# Patient Record
Sex: Male | Born: 1935
Health system: Southern US, Community
[De-identification: ages and names within clinical notes are randomized; demographics above are authoritative.]

## PROBLEM LIST (undated history)

## (undated) DIAGNOSIS — Z87442 Personal history of urinary calculi: Secondary | ICD-10-CM

## (undated) DIAGNOSIS — K259 Gastric ulcer, unspecified as acute or chronic, without hemorrhage or perforation: Secondary | ICD-10-CM

## (undated) DIAGNOSIS — I2510 Atherosclerotic heart disease of native coronary artery without angina pectoris: Secondary | ICD-10-CM

## (undated) DIAGNOSIS — N200 Calculus of kidney: Secondary | ICD-10-CM

## (undated) DIAGNOSIS — C61 Malignant neoplasm of prostate: Secondary | ICD-10-CM

## (undated) DIAGNOSIS — E7800 Pure hypercholesterolemia, unspecified: Secondary | ICD-10-CM

## (undated) DIAGNOSIS — K219 Gastro-esophageal reflux disease without esophagitis: Secondary | ICD-10-CM

## (undated) DIAGNOSIS — M810 Age-related osteoporosis without current pathological fracture: Secondary | ICD-10-CM

## (undated) DIAGNOSIS — I213 ST elevation (STEMI) myocardial infarction of unspecified site: Secondary | ICD-10-CM

## (undated) DIAGNOSIS — I10 Essential (primary) hypertension: Secondary | ICD-10-CM

## (undated) DIAGNOSIS — M1990 Unspecified osteoarthritis, unspecified site: Secondary | ICD-10-CM

## (undated) HISTORY — PX: OTHER SURGICAL HISTORY: SHX169

## (undated) HISTORY — DX: Essential (primary) hypertension: I10

## (undated) HISTORY — DX: Age-related osteoporosis without current pathological fracture: M81.0

## (undated) HISTORY — DX: Unspecified osteoarthritis, unspecified site: M19.90

## (undated) HISTORY — PX: PROSTATE BIOPSY: SHX241

## (undated) HISTORY — DX: Calculus of kidney: N20.0

## (undated) HISTORY — DX: Malignant neoplasm of prostate: C61

## (undated) HISTORY — DX: Gastric ulcer, unspecified as acute or chronic, without hemorrhage or perforation: K25.9

## (undated) HISTORY — DX: Gastro-esophageal reflux disease without esophagitis: K21.9

## (undated) HISTORY — DX: Pure hypercholesterolemia, unspecified: E78.00

---

## 1998-06-30 ENCOUNTER — Other Ambulatory Visit: Admission: RE | Admit: 1998-06-30 | Discharge: 1998-06-30 | Payer: Self-pay | Admitting: Gastroenterology

## 2002-02-14 ENCOUNTER — Encounter: Payer: Self-pay | Admitting: Emergency Medicine

## 2002-02-14 ENCOUNTER — Encounter: Admission: RE | Admit: 2002-02-14 | Discharge: 2002-02-14 | Payer: Self-pay | Admitting: Emergency Medicine

## 2002-08-05 ENCOUNTER — Encounter: Payer: Self-pay | Admitting: Emergency Medicine

## 2002-08-05 ENCOUNTER — Encounter: Admission: RE | Admit: 2002-08-05 | Discharge: 2002-08-05 | Payer: Self-pay | Admitting: Emergency Medicine

## 2002-12-09 ENCOUNTER — Encounter: Admission: RE | Admit: 2002-12-09 | Discharge: 2002-12-09 | Payer: Self-pay | Admitting: Emergency Medicine

## 2002-12-09 ENCOUNTER — Encounter: Payer: Self-pay | Admitting: Emergency Medicine

## 2004-11-26 ENCOUNTER — Encounter: Admission: RE | Admit: 2004-11-26 | Discharge: 2004-11-26 | Payer: Self-pay | Admitting: Emergency Medicine

## 2004-12-07 ENCOUNTER — Ambulatory Visit: Payer: Self-pay | Admitting: Gastroenterology

## 2004-12-24 ENCOUNTER — Ambulatory Visit: Payer: Self-pay | Admitting: Gastroenterology

## 2005-01-11 ENCOUNTER — Ambulatory Visit: Payer: Self-pay | Admitting: Gastroenterology

## 2005-10-17 ENCOUNTER — Encounter: Admission: RE | Admit: 2005-10-17 | Discharge: 2005-10-17 | Payer: Self-pay | Admitting: Emergency Medicine

## 2006-07-11 ENCOUNTER — Encounter: Admission: RE | Admit: 2006-07-11 | Discharge: 2006-07-11 | Payer: Self-pay | Admitting: Emergency Medicine

## 2008-08-26 ENCOUNTER — Emergency Department (HOSPITAL_COMMUNITY): Admission: EM | Admit: 2008-08-26 | Discharge: 2008-08-26 | Payer: Self-pay | Admitting: Emergency Medicine

## 2008-09-30 ENCOUNTER — Ambulatory Visit: Payer: Self-pay | Admitting: Internal Medicine

## 2008-09-30 DIAGNOSIS — M810 Age-related osteoporosis without current pathological fracture: Secondary | ICD-10-CM | POA: Insufficient documentation

## 2008-09-30 DIAGNOSIS — M1990 Unspecified osteoarthritis, unspecified site: Secondary | ICD-10-CM | POA: Insufficient documentation

## 2008-09-30 DIAGNOSIS — I10 Essential (primary) hypertension: Secondary | ICD-10-CM | POA: Insufficient documentation

## 2008-09-30 DIAGNOSIS — Z860100 Personal history of colon polyps, unspecified: Secondary | ICD-10-CM | POA: Insufficient documentation

## 2008-09-30 DIAGNOSIS — J309 Allergic rhinitis, unspecified: Secondary | ICD-10-CM | POA: Insufficient documentation

## 2008-09-30 DIAGNOSIS — K219 Gastro-esophageal reflux disease without esophagitis: Secondary | ICD-10-CM

## 2008-09-30 DIAGNOSIS — H903 Sensorineural hearing loss, bilateral: Secondary | ICD-10-CM

## 2008-09-30 DIAGNOSIS — Z87442 Personal history of urinary calculi: Secondary | ICD-10-CM | POA: Insufficient documentation

## 2009-03-30 ENCOUNTER — Emergency Department (HOSPITAL_COMMUNITY): Admission: EM | Admit: 2009-03-30 | Discharge: 2009-03-30 | Payer: Self-pay | Admitting: Emergency Medicine

## 2009-12-03 ENCOUNTER — Encounter (INDEPENDENT_AMBULATORY_CARE_PROVIDER_SITE_OTHER): Payer: Self-pay | Admitting: *Deleted

## 2010-04-26 ENCOUNTER — Ambulatory Visit: Payer: Self-pay | Admitting: Urology

## 2010-09-29 ENCOUNTER — Telehealth: Payer: Self-pay | Admitting: Gastroenterology

## 2010-10-26 NOTE — Letter (Signed)
Summary: Colonoscopy Letter  [facility]  [place], [place] [address]   Phone: [phone]  Fax: [phone]      December 03, 2009 MRN: [id number]   [facility] [address] [place], [place]  [address]   Dear Tommy Dyer,   According to your medical record, it is time for you to schedule a Colonoscopy. The American Cancer Society recommends this procedure as a method to detect early colon cancer. Patients with a family history of colon cancer, or a personal history of colon polyps or inflammatory bowel disease are at increased risk.  This letter has beeen generated based on the recommendations made at the time of your procedure. If you feel that in your particular situation this may no longer apply, please contact our office.  Please call our office at [phone] to schedule this appointment or to update your records at your earliest convenience.  Thank you for cooperating with [place] to provide you with the very best care possible.   Sincerely,  Tommy Dyer Plan, M.D.  [facility] [phone]

## 2010-10-28 NOTE — Progress Notes (Signed)
Summary: Schedule Colonoscopy  Phone Note Outgoing Call Call back at Home Phone [phone]   Call placed by: Bernita Buffy CMA Deborra Medina),  September 29, 2010 8:27 AM Call placed to: Patient Summary of Call: patient due for colonoscopy and he will call back to schedule with any MD since Dr. Velora Heckler was his last MD. I gave the pt the number and he will check his schedule and call back . Initial call taken by: Bernita Buffy CMA ([place]),  September 29, 2010 8:28 AM

## 2011-01-02 LAB — POCT I-STAT, CHEM 8
BUN: 23 mg/dL (ref 6–23)
Creatinine, Ser: 1.4 mg/dL (ref 0.4–1.5)
Glucose, Bld: 90 mg/dL (ref 70–99)
HCT: 44 % (ref 39.0–52.0)
Potassium: 3.8 mEq/L (ref 3.5–5.1)

## 2011-04-13 NOTE — Telephone Encounter (Signed)
Error

## 2011-06-27 ENCOUNTER — Emergency Department (HOSPITAL_COMMUNITY): Payer: Medicare PPO

## 2011-06-27 ENCOUNTER — Emergency Department (HOSPITAL_COMMUNITY)
Admission: EM | Admit: 2011-06-27 | Discharge: 2011-06-27 | Disposition: A | Discharge status: Home / Self Care | Payer: Medicare PPO | Attending: Emergency Medicine | Admitting: Emergency Medicine

## 2011-06-27 DIAGNOSIS — R0602 Shortness of breath: Secondary | ICD-10-CM | POA: Insufficient documentation

## 2011-06-27 DIAGNOSIS — R05 Cough: Secondary | ICD-10-CM | POA: Insufficient documentation

## 2011-06-27 DIAGNOSIS — Z79899 Other long term (current) drug therapy: Secondary | ICD-10-CM | POA: Insufficient documentation

## 2011-06-27 DIAGNOSIS — R109 Unspecified abdominal pain: Secondary | ICD-10-CM | POA: Insufficient documentation

## 2011-06-27 DIAGNOSIS — R059 Cough, unspecified: Secondary | ICD-10-CM | POA: Insufficient documentation

## 2011-06-27 DIAGNOSIS — K219 Gastro-esophageal reflux disease without esophagitis: Secondary | ICD-10-CM | POA: Insufficient documentation

## 2011-06-27 DIAGNOSIS — E7800 Pure hypercholesterolemia, unspecified: Secondary | ICD-10-CM | POA: Insufficient documentation

## 2011-06-27 DIAGNOSIS — M810 Age-related osteoporosis without current pathological fracture: Secondary | ICD-10-CM | POA: Insufficient documentation

## 2011-06-27 DIAGNOSIS — R079 Chest pain, unspecified: Secondary | ICD-10-CM | POA: Insufficient documentation

## 2011-06-27 DIAGNOSIS — I10 Essential (primary) hypertension: Secondary | ICD-10-CM | POA: Insufficient documentation

## 2011-06-27 LAB — POCT I-STAT TROPONIN I: Troponin i, poc: 0 ng/mL (ref 0.00–0.08)

## 2011-06-27 LAB — COMPREHENSIVE METABOLIC PANEL
ALT: 26 U/L (ref 0–53)
Alkaline Phosphatase: 50 U/L (ref 39–117)
Calcium: 9.6 mg/dL (ref 8.4–10.5)
GFR calc non Af Amer: 50 mL/min — ABNORMAL LOW (ref >90)
Potassium: 3.8 mEq/L (ref 3.5–5.1)
Sodium: 136 mEq/L (ref 135–145)
Total Bilirubin: 0.3 mg/dL (ref 0.3–1.2)
Total Protein: 8 g/dL (ref 6.0–8.3)

## 2011-06-27 LAB — CBC
Hemoglobin: 14.6 g/dL (ref 13.0–17.0)
MCH: 30.4 pg (ref 26.0–34.0)
MCV: 82.7 fL (ref 78.0–100.0)
Platelets: 204 10*3/uL (ref 150–400)

## 2011-06-27 LAB — DIFFERENTIAL
Lymphocytes Relative: 30 % (ref 12–46)
Lymphs Abs: 1.7 10*3/uL (ref 0.7–4.0)
Monocytes Relative: 11 % (ref 3–12)
Neutro Abs: 3.2 10*3/uL (ref 1.7–7.7)
Neutrophils Relative %: 56 % (ref 43–77)

## 2011-06-27 LAB — LIPASE, BLOOD: Lipase: 49 U/L (ref 11–59)

## 2013-04-23 ENCOUNTER — Other Ambulatory Visit: Payer: Self-pay | Admitting: Internal Medicine

## 2013-04-23 DIAGNOSIS — R7989 Other specified abnormal findings of blood chemistry: Secondary | ICD-10-CM

## 2013-04-24 ENCOUNTER — Other Ambulatory Visit: Payer: Medicare PPO

## 2013-04-25 ENCOUNTER — Ambulatory Visit
Admission: RE | Admit: 2013-04-25 | Discharge: 2013-04-25 | Disposition: A | Discharge status: Home / Self Care | Payer: Medicare PPO | Source: Ambulatory Visit | Attending: Internal Medicine | Admitting: Internal Medicine

## 2013-04-25 DIAGNOSIS — R7989 Other specified abnormal findings of blood chemistry: Secondary | ICD-10-CM

## 2013-10-15 ENCOUNTER — Encounter: Payer: Self-pay | Admitting: Radiation Oncology

## 2013-10-15 DIAGNOSIS — C61 Malignant neoplasm of prostate: Secondary | ICD-10-CM | POA: Insufficient documentation

## 2013-10-15 NOTE — Progress Notes (Signed)
GU Location of Tumor / Histology: adenocarcinoma of the prostate   If Prostate Cancer, Gleason Score is (3 + 3=6) and PSA is (5.04); prostate volume 48.65 cc.  Patient evaluated August 2013 for left testicular discomfort x several months.  Repeat biopsy of prostate revealed:     Past/Anticipated interventions by urology, if any: active surveillance following initial biopsy done September 2013.  Past/Anticipated interventions by medical oncology, if any: None  Weight changes, if any:   Bowel/Bladder complaints, if any:    Nausea/Vomiting, if any:  Pain issues, if any:    SAFETY ISSUES:  Prior radiation? NO  Pacemaker/ICD? NO  Possible current pregnancy? N/A  Is the patient on methotrexate? N/A  Current Complaints / other details:  78 year old male. Married. Encouraged to consider definitive therapy instead of active surveillance because progression has been noted since September of 2013.

## 2013-10-16 ENCOUNTER — Telehealth: Payer: Self-pay | Admitting: *Deleted

## 2013-10-16 ENCOUNTER — Ambulatory Visit
Admission: RE | Admit: 2013-10-16 | Discharge: 2013-10-16 | Disposition: A | Discharge status: Home / Self Care | Payer: Medicare PPO | Source: Ambulatory Visit | Attending: Radiation Oncology | Admitting: Radiation Oncology

## 2013-10-16 ENCOUNTER — Encounter: Payer: Self-pay | Admitting: Radiation Oncology

## 2013-10-16 VITALS — HR 99 | Temp 98.0°F | Resp 16 | Ht 71.0 in | Wt 210.8 lb

## 2013-10-16 DIAGNOSIS — I10 Essential (primary) hypertension: Secondary | ICD-10-CM | POA: Insufficient documentation

## 2013-10-16 DIAGNOSIS — E7800 Pure hypercholesterolemia, unspecified: Secondary | ICD-10-CM | POA: Insufficient documentation

## 2013-10-16 DIAGNOSIS — C61 Malignant neoplasm of prostate: Secondary | ICD-10-CM

## 2013-10-16 DIAGNOSIS — Z87891 Personal history of nicotine dependence: Secondary | ICD-10-CM | POA: Insufficient documentation

## 2013-10-16 DIAGNOSIS — Z79899 Other long term (current) drug therapy: Secondary | ICD-10-CM | POA: Insufficient documentation

## 2013-10-16 DIAGNOSIS — K219 Gastro-esophageal reflux disease without esophagitis: Secondary | ICD-10-CM | POA: Insufficient documentation

## 2013-10-16 NOTE — Progress Notes (Signed)
See progress note under physician encounter. 

## 2013-10-16 NOTE — Telephone Encounter (Signed)
Called patient to inform of gold seed placement on 11-07-13 - arrival time - 10:15 am Dr. Janice Norrie' Office  and his sim on 11-13-13 @ 10:00 am at Dr. Johny Shears Office, spoke with patient and he is aware of these appts.

## 2013-10-16 NOTE — Progress Notes (Signed)
Radiation Oncology         [phone] ________________________________  Initial outpatient Consultation  Name: Tommy Dyer MRN: [id number]  Date: 10/16/2013  DOB: 1935/11/11  [id number] Ronnald Ramp, MD  Lowella Bandy, MD   REFERRING PHYSICIAN: Lowella Bandy, MD  DIAGNOSIS: 78 y.o. gentleman with stage T1c adenocarcinoma of the prostate with a Gleason's score of 3+3 and a PSA of 5.04  HISTORY OF PRESENT ILLNESS::Tommy Dyer is a 78 y.o. gentleman.  He was noted to have an elevated PSA of 5.04 by his primary care physician, Dr. Delfina Redwood.  Accordingly, he was referred for evaluation in urology by Dr. Janice Norrie in August, 2013,  digital rectal examination was performed at that time revealing a 3+ gland with no nodules.  The patient proceeded to transrectal ultrasound with 12 biopsies of the prostate on 06/21/12.  Out of 12 core biopsies, 4 were positive.  The maximum Gleason score was 3+3, and this was seen in right sided specimens.  The patient reviewed the biopsy results with his urologist and elected to pursue active surveillance.  In follow-up, the patient proceeded to transrectal ultrasound with 12 biopsies of the prostate on 09/05/13.  Out of 12 core biopsies, 8 were positive.  The maximum Gleason score was 3+3, and this was seen in bilateral specimens.  The patient reviewed the biopsy results with his urologist and he has kindly been referred today for discussion of potential radiation treatment options.  PREVIOUS RADIATION THERAPY: No  PAST MEDICAL HISTORY:  has a past medical history of Prostate cancer; Arthritis; Gastric ulcer; GERD (gastroesophageal reflux disease); Hypercholesterolemia; Hypertension; and Kidney stones.    PAST SURGICAL HISTORY: Past Surgical History  Procedure Laterality Date  . Prostate biopsy    . Prostate biopsy    . Colonscopy      FAMILY HISTORY: family history includes Cancer in his maternal grandmother and mother.  SOCIAL HISTORY:  reports that he quit smoking  about 10 years ago. He has never used smokeless tobacco. He reports that he does not drink alcohol or use illicit drugs.  ALLERGIES: Review of patient's allergies indicates no known allergies.  MEDICATIONS:  Current Outpatient Prescriptions  Medication Sig Dispense Refill  . acetaminophen (TYLENOL) 325 MG tablet Take 650 mg by mouth every 6 (six) hours as needed.      Marland Kitchen amLODipine (NORVASC) 10 MG tablet Take 10 mg by mouth daily.      Marland Kitchen aspirin 81 MG tablet Take 81 mg by mouth daily.      . Calcium Carbonate-Vitamin D (CALCIUM + D PO) Take by mouth.      . fenofibrate 54 MG tablet Take 54 mg by mouth daily.      . meclizine (ANTIVERT) 25 MG tablet Take 25 mg by mouth 3 (three) times daily as needed for dizziness.      . mometasone (NASONEX) 50 MCG/ACT nasal spray Place 2 sprays into the nose daily.      . psyllium (METAMUCIL) 58.6 % powder Take 1 packet by mouth 3 (three) times daily.      Marland Kitchen HYDROcodone-acetaminophen (NORCO/VICODIN) 5-325 MG per tablet Take 1 tablet by mouth every 6 (six) hours as needed for moderate pain.      . ranitidine (ZANTAC) 150 MG tablet Take 150 mg by mouth 2 (two) times daily.      Marland Kitchen testosterone (ANDROGEL) 50 MG/5GM GEL Place 5 g onto the skin daily.       No current facility-administered medications for this encounter.  REVIEW OF SYSTEMS:  A 15 point review of systems is documented in the electronic medical record. This was obtained by the nursing staff. However, I reviewed this with the patient to discuss relevant findings and make appropriate changes.  A comprehensive review of systems was negative..  The patient completed an IPSS and IIEF questionnaire.     PHYSICAL EXAM: This patient is in no acute distress.  He is alert and oriented.   height is 5\' 11"  (1.803 m) and weight is 210 lb 12.8 oz (95.618 kg). His oral temperature is 98 F (36.7 C). His pulse is 99. His respiration is 16 and oxygen saturation is 96%.  He exhibits no respiratory distress or  labored breathing.  He appears neurologically intact.  His mood is pleasant.  His affect is appropriate.  Please note the digital rectal exam findings described above.  KPS = 100   LABORATORY DATA:  Lab Results  Component Value Date   WBC 5.7 06/27/2011   HGB 14.6 06/27/2011   HCT 39.7 06/27/2011   MCV 82.7 06/27/2011   PLT 204 06/27/2011   Lab Results  Component Value Date   NA 136 06/27/2011   K 3.8 06/27/2011   CL 103 06/27/2011   CO2 24 06/27/2011   Lab Results  Component Value Date   ALT 26 06/27/2011   AST 33 06/27/2011   ALKPHOS 50 06/27/2011   BILITOT 0.3 06/27/2011     RADIOGRAPHY: No results found.    IMPRESSION: This gentleman is a 78 y.o. gentleman with stage T1c adenocarcinoma of the prostate with a Gleason's score of 3+3 and a PSA of 5.04.  His T-Stage, Gleason's Score, and PSA put him into the favorable risk group.  Accordingly he is eligible for a variety of potential treatment options including continued surveillance, prostatectomy, IMRT or seed implant.  PLAN:Today I reviewed the findings and workup thus far.  We discussed the natural history of prostate cancer.  We reviewed the the implications of T-stage, Gleason's Score, and PSA on decision-making and outcomes in prostate cancer.  We discussed radiation treatment in the management of prostate cancer with regard to the logistics and delivery of external beam radiation treatment as well as the logistics and delivery of prostate brachytherapy.  We compared and contrasted each of these approaches and also compared these against prostatectomy.  The patient expressed interest in external beam radiotherapy.  I filled out a patient counseling form for him with relevant treatment diagrams and we retained a copy for our records.   The patient would like to proceed with prostate IMRT.  I will share my findings with Dr. Janice Norrie and move forward with scheduling placement of three gold fiducial markers into the prostate to proceed with IMRT  in the near future.     I enjoyed meeting with him today, and will look forward to participating in the care of this very nice gentleman.   I spent 60 minutes face to face with the patient and more than 50% of that time was spent in counseling and/or coordination of care.   ------------------------------------------------  Sheral Apley. Tammi Klippel, M.D.

## 2013-10-16 NOTE — Progress Notes (Signed)
IPSS 14. Reports difficulty empty his bladder. Reports testicular pain with erections since teenage years. Denies testicular pain with bowel movements. Denies constipation, diarrhea, or blood in stool. Denies hematuria or dysuria. Describes strong steady stream at start of void that tapers out then, followed by a dribble. Denies incontinence or urgency. Denies nausea, vomiting, or weight loss. Reports occasional night sweats (twice this weak). Reports occasional low back pain resolved with tylenol. Denies bone pain but, does reports occasional bursitis.

## 2013-10-25 ENCOUNTER — Encounter: Payer: Self-pay | Admitting: *Deleted

## 2013-10-25 NOTE — Progress Notes (Signed)
[place] Psychosocial Distress Screening Clinical Social Work  Clinical Social Work was referred by distress screening protocol.  The patient scored a 8 on the Psychosocial Distress Thermometer which indicates severe distress. Clinical Social Worker phoned to assess for distress and other psychosocial needs. Pt reports anxiety surrounding his upcoming treatment. CSW processed these emotions and discussed coping techniques to assist. Pt is very interested in the Prostate Support Group and CSW provided him with this info as well as CSW info for other follow up.     Clinical Social Worker follow up needed: no  Loren Racer, [place] Social Worker [facility] for [facility] Wednesday, Thursday and Friday Phone: [phone] Fax: [phone]

## 2013-11-07 ENCOUNTER — Encounter: Payer: Self-pay | Admitting: Radiation Oncology

## 2013-11-07 NOTE — Progress Notes (Signed)
Patient phoned inquiring about cost of radiation treatments.  Patient has [employer] - looked up on website, currently patient has an individual OOP [phone] (already met $45).  Explained to him that we do not bill until he is finished with treatment and will not have to pay a co-pay each time he comes in.  Told him about the patient acctg office that will set up a payment plan when the time comes.

## 2013-11-12 ENCOUNTER — Encounter: Payer: Self-pay | Admitting: Radiation Oncology

## 2013-11-12 NOTE — Progress Notes (Addendum)
  Radiation Oncology         [phone] ________________________________  Name: Tommy Dyer MRN: [id number]  Date: 11/13/2013  DOB: [birth year]  SIMULATION AND TREATMENT PLANNING NOTE  DIAGNOSIS:  78 y.o. gentleman with stage T1c adenocarcinoma of the prostate with a Gleason's score of 3+3 and a PSA of 5.04  NARRATIVE:  The patient was brought to the [facility].  Identity was confirmed.  All relevant records and images related to the planned course of therapy were reviewed.  The patient freely provided informed written consent to proceed with treatment after reviewing the details related to the planned course of therapy. The consent form was witnessed and verified by the simulation staff.  Then, the patient was set-up in a stable reproducible supine position for radiation therapy.  A vacuum lock pillow device was custom fabricated to position his legs in a reproducible immobilized position.  Then, I performed a urethrogram under sterile conditions to identify the prostatic apex.  CT images were obtained.  Surface markings were placed.  The CT images were loaded into the planning software.  Then the prostate target and avoidance structures including the rectum, bladder, bowel and hips were contoured.  Treatment planning then occurred.  The radiation prescription was entered and confirmed.  A total of one BodyFix complex treatment device was fabricated. I have requested : Intensity Modulated Radiotherapy (IMRT) is medically necessary for this case for the following reason:  Rectal sparing.Marland Kitchen  PLAN:  The patient will receive 76 Gy in 40 fractions.  ________________________________  Sheral Apley Tammi Klippel, M.D.

## 2013-11-13 ENCOUNTER — Encounter: Payer: Self-pay | Admitting: Radiation Oncology

## 2013-11-13 ENCOUNTER — Ambulatory Visit
Admission: RE | Admit: 2013-11-13 | Discharge: 2013-11-13 | Disposition: A | Discharge status: Home / Self Care | Payer: Medicare PPO | Source: Ambulatory Visit | Attending: Radiation Oncology | Admitting: Radiation Oncology

## 2013-11-13 DIAGNOSIS — R351 Nocturia: Secondary | ICD-10-CM | POA: Insufficient documentation

## 2013-11-13 DIAGNOSIS — R5383 Other fatigue: Secondary | ICD-10-CM

## 2013-11-13 DIAGNOSIS — C61 Malignant neoplasm of prostate: Secondary | ICD-10-CM | POA: Insufficient documentation

## 2013-11-13 DIAGNOSIS — R109 Unspecified abdominal pain: Secondary | ICD-10-CM | POA: Insufficient documentation

## 2013-11-13 DIAGNOSIS — Z510 Encounter for antineoplastic radiation therapy: Secondary | ICD-10-CM | POA: Insufficient documentation

## 2013-11-13 DIAGNOSIS — R5381 Other malaise: Secondary | ICD-10-CM | POA: Insufficient documentation

## 2013-11-13 DIAGNOSIS — R300 Dysuria: Secondary | ICD-10-CM | POA: Insufficient documentation

## 2013-11-14 ENCOUNTER — Encounter: Payer: Self-pay | Admitting: *Deleted

## 2013-11-20 ENCOUNTER — Ambulatory Visit: Payer: Medicare PPO | Admitting: Radiation Oncology

## 2013-11-21 ENCOUNTER — Ambulatory Visit: Payer: Medicare PPO

## 2013-11-22 ENCOUNTER — Ambulatory Visit: Payer: Medicare PPO

## 2013-11-25 ENCOUNTER — Telehealth: Payer: Self-pay | Admitting: Radiation Oncology

## 2013-11-25 ENCOUNTER — Ambulatory Visit
Admission: RE | Admit: 2013-11-25 | Discharge: 2013-11-25 | Disposition: A | Discharge status: Home / Self Care | Payer: Medicare PPO | Source: Ambulatory Visit | Attending: Radiation Oncology | Admitting: Radiation Oncology

## 2013-11-25 NOTE — Telephone Encounter (Signed)
Returned message left by patient. Patient questions if he can continue to walk a mile on the treadmill at the [facility]. Patient started radiation treatment to prostate today. Encouraged patient to continue at 30 minutes of exercise each day to limit future fatigue. Also, patient question if he could wear deodorant. Explained he could but, to avoid creams, powders, lotions and perfumes within treatment field/pelvic region four hours prior to treatment. Patient verbalized understanding of all reviewed. Encouraged patient to contact staff with future needs.

## 2013-11-26 ENCOUNTER — Ambulatory Visit
Admission: RE | Admit: 2013-11-26 | Discharge: 2013-11-26 | Disposition: A | Discharge status: Home / Self Care | Payer: Medicare PPO | Source: Ambulatory Visit | Attending: Radiation Oncology | Admitting: Radiation Oncology

## 2013-11-27 ENCOUNTER — Ambulatory Visit
Admission: RE | Admit: 2013-11-27 | Discharge: 2013-11-27 | Disposition: A | Discharge status: Home / Self Care | Payer: Medicare PPO | Source: Ambulatory Visit | Attending: Radiation Oncology | Admitting: Radiation Oncology

## 2013-11-28 ENCOUNTER — Ambulatory Visit
Admission: RE | Admit: 2013-11-28 | Discharge: 2013-11-28 | Disposition: A | Discharge status: Home / Self Care | Payer: Medicare PPO | Source: Ambulatory Visit | Attending: Radiation Oncology | Admitting: Radiation Oncology

## 2013-11-29 ENCOUNTER — Ambulatory Visit
Admission: RE | Admit: 2013-11-29 | Discharge: 2013-11-29 | Disposition: A | Discharge status: Home / Self Care | Payer: Medicare PPO | Source: Ambulatory Visit | Attending: Radiation Oncology | Admitting: Radiation Oncology

## 2013-11-29 ENCOUNTER — Encounter: Payer: Self-pay | Admitting: Radiation Oncology

## 2013-11-29 VITALS — BP 170/87 | HR 99 | Resp 16 | Wt 213.6 lb

## 2013-11-29 DIAGNOSIS — C61 Malignant neoplasm of prostate: Secondary | ICD-10-CM

## 2013-11-29 NOTE — Progress Notes (Signed)
Denies dysuria or hematuria. Reports a moderate urine stream. Reports nocturia x4. Denies diarrhea. Denies fatigue. Reports dry itchy flaky lower leg skin. Encourage patient to apply lotion to lower legs. No hyperpigmentation noted of lower legs. Patient denies pain in lower legs or numbness.

## 2013-11-30 ENCOUNTER — Encounter: Payer: Self-pay | Admitting: Radiation Oncology

## 2013-11-30 NOTE — Progress Notes (Signed)
  Radiation Oncology         [phone] ________________________________  Name: Tommy Dyer MRN: [id number]  Date: 11/29/2013  DOB: 14-Jul-1936  Weekly Radiation Therapy Management  Current Dose: 9.5 Gy     Planned Dose:  76 Gy  Narrative . . . . . . . . The patient presents for routine under treatment assessment.                                   The patient is without complaint.                                 Set-up films were reviewed.                                 The chart was checked. Physical Findings. . .  weight is 213 lb 9.6 oz (96.888 kg). His blood pressure is 170/87 and his pulse is 99. His respiration is 16. . Weight essentially stable.  No significant changes. Impression . . . . . . . The patient is tolerating radiation. Plan . . . . . . . . . . . . Continue treatment as planned.  ________________________________  Sheral Apley. Tammi Klippel, M.D.

## 2013-12-02 ENCOUNTER — Ambulatory Visit
Admission: RE | Admit: 2013-12-02 | Discharge: 2013-12-02 | Disposition: A | Discharge status: Home / Self Care | Payer: Medicare PPO | Source: Ambulatory Visit | Attending: Radiation Oncology | Admitting: Radiation Oncology

## 2013-12-02 NOTE — Progress Notes (Signed)
11/29/2013 at 1730. Completed post sim education with patient. Provided patient with RADIATION THERAPY AND YOU handbook then, reviewed pertinent information. Reviewed potential side effects and management such as, urinary/bladder changes and fatigue. Encouraged patient to continue to go to the [facility]. Provided patient the opportunity to ask questions. Provided patient with this writers contact information and encouraged to call with needs. Patient verbalized understanding of all reviewed.

## 2013-12-02 NOTE — Addendum Note (Signed)
Encounter addended by: Heywood Footman, RN on: 12/02/2013 12:17 PM<BR>     Documentation filed: Inpatient Patient Education, Inpatient Document Flowsheet, Notes Section, Chief Complaint Section

## 2013-12-03 ENCOUNTER — Ambulatory Visit
Admission: RE | Admit: 2013-12-03 | Discharge: 2013-12-03 | Disposition: A | Discharge status: Home / Self Care | Payer: Medicare PPO | Source: Ambulatory Visit | Attending: Radiation Oncology | Admitting: Radiation Oncology

## 2013-12-03 ENCOUNTER — Ambulatory Visit: Admission: RE | Admit: 2013-12-03 | Payer: Medicare PPO | Source: Ambulatory Visit

## 2013-12-04 ENCOUNTER — Ambulatory Visit
Admission: RE | Admit: 2013-12-04 | Discharge: 2013-12-04 | Disposition: A | Discharge status: Home / Self Care | Payer: Medicare PPO | Source: Ambulatory Visit | Attending: Radiation Oncology | Admitting: Radiation Oncology

## 2013-12-05 ENCOUNTER — Ambulatory Visit
Admission: RE | Admit: 2013-12-05 | Discharge: 2013-12-05 | Disposition: A | Discharge status: Home / Self Care | Payer: Medicare PPO | Source: Ambulatory Visit | Attending: Radiation Oncology | Admitting: Radiation Oncology

## 2013-12-06 ENCOUNTER — Encounter: Payer: Self-pay | Admitting: Radiation Oncology

## 2013-12-06 ENCOUNTER — Ambulatory Visit
Admission: RE | Admit: 2013-12-06 | Discharge: 2013-12-06 | Disposition: A | Discharge status: Home / Self Care | Payer: Medicare PPO | Source: Ambulatory Visit | Attending: Radiation Oncology | Admitting: Radiation Oncology

## 2013-12-06 VITALS — Resp 16 | Wt 211.5 lb

## 2013-12-06 DIAGNOSIS — R300 Dysuria: Secondary | ICD-10-CM | POA: Insufficient documentation

## 2013-12-06 DIAGNOSIS — C61 Malignant neoplasm of prostate: Secondary | ICD-10-CM | POA: Diagnosis present

## 2013-12-06 LAB — URINALYSIS, MICROSCOPIC - CHCC
Bilirubin (Urine): NEGATIVE
GLUCOSE UR CHCC: NEGATIVE mg/dL
Ketones: NEGATIVE mg/dL
NITRITE: NEGATIVE
Protein: 30 mg/dL
SPECIFIC GRAVITY, URINE: 1.01 (ref 1.003–1.035)
UROBILINOGEN UR: 0.2 mg/dL (ref 0.2–1)
pH: 7.5 (ref 4.6–8.0)

## 2013-12-06 MED ORDER — TAMSULOSIN HCL 0.4 MG PO CAPS: 0.4000 mg | ORAL_CAPSULE | Freq: Every day | ORAL | Status: DC

## 2013-12-06 NOTE — Progress Notes (Signed)
Denies dysuria or hematuria. Reports a moderate urine stream. Reports nocturia x 6 or more making it difficult to rest. Reports loose stool. Denies fatigue. Reports increase in frequency of semen in under shorts for no reason.

## 2013-12-06 NOTE — Addendum Note (Signed)
Encounter addended by: Lora Paula, MD on: 12/06/2013 12:33 PM<BR>     Documentation filed: Visit Diagnoses, Orders

## 2013-12-06 NOTE — Progress Notes (Signed)
  Radiation Oncology         [phone] ________________________________  Name: Tommy Dyer MRN: [id number]  Date: 12/06/2013  DOB: Aug 24, 1936  Weekly Radiation Therapy Management  Current Dose: 19 Gy     Planned Dose:  76 Gy  Narrative . . . . . . . . The patient presents for routine under treatment assessment.                                   Denies dysuria or hematuria. Reports a moderate urine stream. Reports nocturia x 6 or more making it difficult to rest. Reports loose stool. Denies fatigue. Reports increase in frequency of semen in under shorts for no reason                                 Set-up films were reviewed.                                 The chart was checked. Physical Findings. . .  weight is 211 lb 8 oz (95.936 kg). His respiration is 16. . Weight essentially stable.  No significant changes. Impression . . . . . . . The patient is tolerating radiation. Plan . . . . . . . . . . . . Continue treatment as planned.  Start Flomax.  ________________________________  Sheral Apley Tammi Klippel, M.D.

## 2013-12-07 LAB — URINE CULTURE

## 2013-12-07 NOTE — Progress Notes (Signed)
Quick Note:  Please call patient with normal result.  Thanks. MM ______ 

## 2013-12-09 ENCOUNTER — Ambulatory Visit
Admission: RE | Admit: 2013-12-09 | Discharge: 2013-12-09 | Disposition: A | Discharge status: Home / Self Care | Payer: Medicare PPO | Source: Ambulatory Visit | Attending: Radiation Oncology | Admitting: Radiation Oncology

## 2013-12-09 ENCOUNTER — Telehealth: Payer: Self-pay | Admitting: Radiation Oncology

## 2013-12-09 NOTE — Telephone Encounter (Signed)
As directed by Dr. Tammi Klippel phoned patient informing him of normal results of UA and C&S. Patient verbalized understanding and expressed appreciation for the call.

## 2013-12-10 ENCOUNTER — Ambulatory Visit
Admission: RE | Admit: 2013-12-10 | Discharge: 2013-12-10 | Disposition: A | Discharge status: Home / Self Care | Payer: Medicare PPO | Source: Ambulatory Visit | Attending: Radiation Oncology | Admitting: Radiation Oncology

## 2013-12-11 ENCOUNTER — Ambulatory Visit
Admission: RE | Admit: 2013-12-11 | Discharge: 2013-12-11 | Disposition: A | Discharge status: Home / Self Care | Payer: Medicare PPO | Source: Ambulatory Visit | Attending: Radiation Oncology | Admitting: Radiation Oncology

## 2013-12-12 ENCOUNTER — Ambulatory Visit
Admission: RE | Admit: 2013-12-12 | Discharge: 2013-12-12 | Disposition: A | Discharge status: Home / Self Care | Payer: Medicare PPO | Source: Ambulatory Visit | Attending: Radiation Oncology | Admitting: Radiation Oncology

## 2013-12-12 ENCOUNTER — Encounter: Payer: Self-pay | Admitting: Radiation Oncology

## 2013-12-12 VITALS — BP 144/82 | HR 95 | Temp 97.8°F | Resp 16 | Wt 212.8 lb

## 2013-12-12 DIAGNOSIS — C61 Malignant neoplasm of prostate: Secondary | ICD-10-CM

## 2013-12-12 NOTE — Progress Notes (Signed)
  Radiation Oncology         [phone] ________________________________  Name: Tommy Dyer MRN: [id number]  Date: 12/12/2013  DOB: [birth year]  Weekly Radiation Therapy Management  Current Dose: 26.6 Gy     Planned Dose:  76 Gy  Narrative . . . . . . . . The patient presents for routine under treatment assessment.                                  Weekly rad txs 14/40 prostate completed, nocturia x2 last night, has more urgency sated, no hematuria, loose stools stated"But I like it that way", no c/o pain, no fatigue, appetite good, started flomax feels helping some, no dysuria  The patient is without complaint.                                 Set-up films were reviewed.                                 The chart was checked. Physical Findings. . .  weight is 212 lb 12.8 oz (96.525 kg). His oral temperature is 97.8 F (36.6 C). His blood pressure is 144/82 and his pulse is 95. His respiration is 16. . Weight essentially stable.  No significant changes. Impression . . . . . . . The patient is tolerating radiation. Plan . . . . . . . . . . . . Continue treatment as planned.  ________________________________  Sheral Apley. Tammi Klippel, M.D.

## 2013-12-12 NOTE — Progress Notes (Signed)
Weekly rad txs 14/40 prostate completed, nocturia x2 last night, has more urgency sated, no hematuria, loose stools stated"But I like it that way", no c/o pain, no fatigue, appetite good, started flomax feels helping some, no dysuria 11:53 AM

## 2013-12-13 ENCOUNTER — Ambulatory Visit: Payer: Medicare PPO | Admitting: Radiation Oncology

## 2013-12-13 ENCOUNTER — Ambulatory Visit
Admission: RE | Admit: 2013-12-13 | Discharge: 2013-12-13 | Disposition: A | Discharge status: Home / Self Care | Payer: Medicare PPO | Source: Ambulatory Visit | Attending: Radiation Oncology | Admitting: Radiation Oncology

## 2013-12-16 ENCOUNTER — Ambulatory Visit
Admission: RE | Admit: 2013-12-16 | Discharge: 2013-12-16 | Disposition: A | Discharge status: Home / Self Care | Payer: Medicare PPO | Source: Ambulatory Visit | Attending: Radiation Oncology | Admitting: Radiation Oncology

## 2013-12-17 ENCOUNTER — Ambulatory Visit
Admission: RE | Admit: 2013-12-17 | Discharge: 2013-12-17 | Disposition: A | Discharge status: Home / Self Care | Payer: Medicare PPO | Source: Ambulatory Visit | Attending: Radiation Oncology | Admitting: Radiation Oncology

## 2013-12-18 ENCOUNTER — Ambulatory Visit
Admission: RE | Admit: 2013-12-18 | Discharge: 2013-12-18 | Disposition: A | Discharge status: Home / Self Care | Payer: Medicare PPO | Source: Ambulatory Visit | Attending: Radiation Oncology | Admitting: Radiation Oncology

## 2013-12-19 ENCOUNTER — Ambulatory Visit
Admission: RE | Admit: 2013-12-19 | Discharge: 2013-12-19 | Disposition: A | Discharge status: Home / Self Care | Payer: Medicare PPO | Source: Ambulatory Visit | Attending: Radiation Oncology | Admitting: Radiation Oncology

## 2013-12-19 ENCOUNTER — Encounter: Payer: Self-pay | Admitting: Radiation Oncology

## 2013-12-19 NOTE — Progress Notes (Signed)
  Radiation Oncology         [phone] ________________________________  Name: Tommy Dyer MRN: [id number]  Date: 12/20/2013  DOB: [birth year]  Weekly Radiation Therapy Management  Current Dose: 38 Gy     Planned Dose:  76 Gy  Narrative . . . . . . . . The patient presents for routine under treatment assessment.                                   Reports flomax helps empty bladder however, if he forgets a dose he reports difficulty initiating urine flow. Denies dyuria or hematuria. Denies diarrhea but, reports recently he has had to strain to pass stool. Denies fatigue. Reports nocturia x3                                 Set-up films were reviewed.                                 The chart was checked. Physical Findings. . .  weight is 213 lb (96.616 kg). His blood pressure is 150/87 and his pulse is 100. His respiration is 16. . Weight essentially stable.  No significant changes. Impression . . . . . . . The patient is tolerating radiation. Plan . . . . . . . . . . . . Continue treatment as planned.  ________________________________  Sheral Apley. Tammi Klippel, M.D.

## 2013-12-20 ENCOUNTER — Encounter: Payer: Self-pay | Admitting: Radiation Oncology

## 2013-12-20 ENCOUNTER — Ambulatory Visit
Admission: RE | Admit: 2013-12-20 | Discharge: 2013-12-20 | Disposition: A | Discharge status: Home / Self Care | Payer: Medicare PPO | Source: Ambulatory Visit | Attending: Radiation Oncology | Admitting: Radiation Oncology

## 2013-12-20 VITALS — BP 150/87 | HR 100 | Resp 16 | Wt 213.0 lb

## 2013-12-20 DIAGNOSIS — C61 Malignant neoplasm of prostate: Secondary | ICD-10-CM

## 2013-12-20 NOTE — Progress Notes (Signed)
Reports flomax helps empty bladder however, if he forgets a dose he reports difficulty initiating urine flow. Denies dyuria or hematuria. Denies diarrhea but, reports recently he has had to strain to pass stool. Denies fatigue. Reports nocturia x3.

## 2013-12-23 ENCOUNTER — Ambulatory Visit
Admission: RE | Admit: 2013-12-23 | Discharge: 2013-12-23 | Disposition: A | Discharge status: Home / Self Care | Payer: Medicare PPO | Source: Ambulatory Visit | Attending: Radiation Oncology | Admitting: Radiation Oncology

## 2013-12-24 ENCOUNTER — Ambulatory Visit
Admission: RE | Admit: 2013-12-24 | Discharge: 2013-12-24 | Disposition: A | Discharge status: Home / Self Care | Payer: Medicare PPO | Source: Ambulatory Visit | Attending: Radiation Oncology | Admitting: Radiation Oncology

## 2013-12-25 ENCOUNTER — Ambulatory Visit
Admission: RE | Admit: 2013-12-25 | Discharge: 2013-12-25 | Disposition: A | Discharge status: Home / Self Care | Payer: Medicare PPO | Source: Ambulatory Visit | Attending: Radiation Oncology | Admitting: Radiation Oncology

## 2013-12-26 ENCOUNTER — Ambulatory Visit
Admission: RE | Admit: 2013-12-26 | Discharge: 2013-12-26 | Disposition: A | Discharge status: Home / Self Care | Payer: Medicare PPO | Source: Ambulatory Visit | Attending: Radiation Oncology | Admitting: Radiation Oncology

## 2013-12-27 ENCOUNTER — Ambulatory Visit
Admission: RE | Admit: 2013-12-27 | Discharge: 2013-12-27 | Disposition: A | Discharge status: Home / Self Care | Payer: Medicare PPO | Source: Ambulatory Visit | Attending: Radiation Oncology | Admitting: Radiation Oncology

## 2013-12-27 ENCOUNTER — Encounter: Payer: Self-pay | Admitting: Radiation Oncology

## 2013-12-27 VITALS — BP 141/78 | HR 98 | Temp 97.7°F | Resp 20 | Wt 214.0 lb

## 2013-12-27 DIAGNOSIS — C61 Malignant neoplasm of prostate: Secondary | ICD-10-CM

## 2013-12-27 NOTE — Progress Notes (Signed)
Pt reports sinus issues x 2 days w/nasal congestion, clear rhinorrhea, sinus pressure. He has history of sinus/allergy issues. He takes OTC for this. Pt denies fatigue, loss of appetite. He reports urinary frequency/nocturia that comes and goes, slow stream that improves w/Flomax, "stinging" at times, soft BM's x 2 days.

## 2013-12-27 NOTE — Progress Notes (Signed)
   Weekly Management Note:  outpatient Current Dose:  47.5 Gy  Projected Dose: 76 Gy   Narrative:  The patient presents for routine under treatment assessment.  CBCT/MVCT images/Port film x-rays were reviewed.  The chart was checked. Occasional stinging with urination. 1 episode of blood on toilet tissue after firm BM. Now BM is softer and this issue hasn't recurred.  Nocturia x 3. On Flomax.  Physical Findings:  weight is 214 lb (97.07 kg). His oral temperature is 97.7 F (36.5 C). His blood pressure is 141/78 and his pulse is 98. His respiration is 20.  NAD  Impression:  The patient is tolerating radiotherapy.  Plan:  Continue radiotherapy as planned.   ________________________________   Eppie Gibson, M.D.

## 2013-12-30 ENCOUNTER — Ambulatory Visit: Payer: Medicare PPO

## 2013-12-30 ENCOUNTER — Ambulatory Visit
Admission: RE | Admit: 2013-12-30 | Discharge: 2013-12-30 | Disposition: A | Discharge status: Home / Self Care | Payer: Medicare PPO | Source: Ambulatory Visit | Attending: Radiation Oncology | Admitting: Radiation Oncology

## 2013-12-31 ENCOUNTER — Ambulatory Visit
Admission: RE | Admit: 2013-12-31 | Discharge: 2013-12-31 | Disposition: A | Discharge status: Home / Self Care | Payer: Medicare PPO | Source: Ambulatory Visit | Attending: Radiation Oncology | Admitting: Radiation Oncology

## 2013-12-31 ENCOUNTER — Ambulatory Visit: Payer: Medicare PPO

## 2014-01-01 ENCOUNTER — Ambulatory Visit: Payer: Medicare PPO

## 2014-01-01 ENCOUNTER — Ambulatory Visit
Admission: RE | Admit: 2014-01-01 | Discharge: 2014-01-01 | Disposition: A | Discharge status: Home / Self Care | Payer: Medicare PPO | Source: Ambulatory Visit | Attending: Radiation Oncology | Admitting: Radiation Oncology

## 2014-01-02 ENCOUNTER — Ambulatory Visit
Admission: RE | Admit: 2014-01-02 | Discharge: 2014-01-02 | Disposition: A | Discharge status: Home / Self Care | Payer: Medicare PPO | Source: Ambulatory Visit | Attending: Radiation Oncology | Admitting: Radiation Oncology

## 2014-01-02 ENCOUNTER — Encounter: Payer: Self-pay | Admitting: Radiation Oncology

## 2014-01-02 ENCOUNTER — Ambulatory Visit: Payer: Medicare PPO

## 2014-01-02 VITALS — BP 137/75 | HR 101 | Resp 16 | Wt 213.0 lb

## 2014-01-02 DIAGNOSIS — C61 Malignant neoplasm of prostate: Secondary | ICD-10-CM

## 2014-01-02 NOTE — Progress Notes (Signed)
  Radiation Oncology         ([phone] ________________________________  Name: Tommy Dyer MRN: [id number]  Date: 01/02/2014  DOB: 1936-08-01  Weekly Radiation Therapy Management  Current Dose: 55.1 Gy     Planned Dose:  76 Gy  Narrative . . . . . . . . The patient presents for routine under treatment assessment.                                   Denies hematuria. Reports several episodes of diarrhea on Sunday for which mylanta resolved. Then, patient reports he didn't have a bowel movement for two day but, has since returned to daily routine formed bowel movements. Reports tingling in right foot. Reports he continues to take flomax as directed. Reports a "twinge" when he voids. Reports a "funny sensation" associated with bowel movements. Denies any of these sensations are unbearable. Reports nocturia x2. Denies fatigue and continues to work out regularly at [employer] were reviewed.                                 The chart was checked. Physical Findings. . .  weight is 213 lb (96.616 kg). His blood pressure is 137/75 and his pulse is 101. His respiration is 16. . Weight essentially stable.  No significant changes. Impression . . . . . . . The patient is tolerating radiation. Plan . . . . . . . . . . . . Continue treatment as planned.  ________________________________  Sheral Apley. Tammi Klippel, M.D.

## 2014-01-02 NOTE — Progress Notes (Signed)
Denies hematuria. Reports several episodes of diarrhea on Sunday for which mylanta resolved. Then, patient reports he didn't have a bowel movement for two day but, has since returned to daily routine formed bowel movements. Reports tingling in right foot. Reports he continues to take flomax as directed. Reports a "twinge" when he voids. Reports a "funny sensation" associated with bowel movements. Denies any of these sensations are unbearable. Reports nocturia x2. Denies fatigue and continues to work out regularly at [employer].

## 2014-01-03 ENCOUNTER — Ambulatory Visit: Payer: Medicare PPO

## 2014-01-03 ENCOUNTER — Ambulatory Visit
Admission: RE | Admit: 2014-01-03 | Discharge: 2014-01-03 | Disposition: A | Discharge status: Home / Self Care | Payer: Medicare PPO | Source: Ambulatory Visit | Attending: Radiation Oncology | Admitting: Radiation Oncology

## 2014-01-06 ENCOUNTER — Ambulatory Visit: Payer: Medicare PPO

## 2014-01-06 ENCOUNTER — Encounter: Payer: Self-pay | Admitting: Radiation Oncology

## 2014-01-06 ENCOUNTER — Ambulatory Visit
Admission: RE | Admit: 2014-01-06 | Discharge: 2014-01-06 | Disposition: A | Discharge status: Home / Self Care | Payer: Medicare PPO | Source: Ambulatory Visit | Attending: Radiation Oncology | Admitting: Radiation Oncology

## 2014-01-06 VITALS — BP 160/81 | HR 96 | Temp 97.6°F | Resp 20 | Wt 212.6 lb

## 2014-01-06 DIAGNOSIS — C61 Malignant neoplasm of prostate: Secondary | ICD-10-CM

## 2014-01-06 MED ORDER — HYDROCORTISONE ACETATE 25 MG RE SUPP: 25.0000 mg | Freq: Two times a day (BID) | RECTAL | Status: DC | PRN

## 2014-01-06 NOTE — Addendum Note (Signed)
Encounter addended by: Arlyss Repress, RN on: 01/06/2014  4:29 PM<BR>     Documentation filed: Notes Section

## 2014-01-06 NOTE — Progress Notes (Signed)
  Radiation Oncology         [phone] ________________________________  Name: SABIN MOLTON MRN: [id number]  Date: 01/06/2014  DOB: Aug 20, 1936  Weekly Radiation Therapy Management  Current Dose: 58.9 Gy     Planned Dose:  76 Gy  Narrative . . . . . . . . The patient presents c/o bleeding after bowel movements Friday, Saturday,Sunday and today, Felt after 1st bowel movement today, had urge for 2nd bm, stained, but only blood came out, having low abdominal cramping as well, nocturia x3, slight dysuria at times, , when taking flomax, voiding okay, but if he skips a day he has problems starting and stopping flow, appetite good, drinks mostly water and tea,                                 Set-up films were reviewed.                                 The chart was checked. Physical Findings. . .  weight is 212 lb 9.6 oz (96.435 kg). His oral temperature is 97.6 F (36.4 C). His blood pressure is 160/81 and his pulse is 96. His respiration is 20. . Weight essentially stable.  No significant changes. Impression . . . . . . . The patient is tolerating radiation. Plan . . . . . . . . . . . . Continue treatment as planned.  Given Proctosol.  ________________________________  Sheral Apley Tammi Klippel, M.D.

## 2014-01-06 NOTE — Progress Notes (Signed)
Weekly rad txs, 31/40 prostate completed, c/o bleeding after bowel movements Friday, Saturday,Sunday and today,  Felt after 1st bowel movement today, had urge for 2nd bm, stained, but only blood came out, having low abdominal cramping as well, nocturia x3, slight dysuria at times, , when taking flomax, voiding okay, but if he skips a day he has problems starting and stopping flow, appetite good, drinks mostly water and tea,,

## 2014-01-06 NOTE — Progress Notes (Signed)
Patient called back to inquire about prescription comparable to anusol suppositories as they cost $75 for 10.I notified [facility] pharmacist and he states proctosol hc cream is comparable at $26.22.

## 2014-01-07 ENCOUNTER — Ambulatory Visit: Payer: Medicare PPO

## 2014-01-07 ENCOUNTER — Telehealth: Payer: Self-pay | Admitting: Radiation Oncology

## 2014-01-07 ENCOUNTER — Ambulatory Visit: Admission: RE | Admit: 2014-01-07 | Payer: Medicare PPO | Source: Ambulatory Visit

## 2014-01-07 NOTE — Telephone Encounter (Signed)
Phoned patient's cell and home number to confirm he picked up the Anusol script. No answer. Left message requesting return call.

## 2014-01-08 ENCOUNTER — Ambulatory Visit
Admission: RE | Admit: 2014-01-08 | Discharge: 2014-01-08 | Disposition: A | Discharge status: Home / Self Care | Payer: Medicare PPO | Source: Ambulatory Visit | Attending: Radiation Oncology | Admitting: Radiation Oncology

## 2014-01-08 ENCOUNTER — Ambulatory Visit: Payer: Medicare PPO

## 2014-01-09 ENCOUNTER — Ambulatory Visit: Payer: Medicare PPO

## 2014-01-09 ENCOUNTER — Ambulatory Visit
Admission: RE | Admit: 2014-01-09 | Discharge: 2014-01-09 | Disposition: A | Discharge status: Home / Self Care | Payer: Medicare PPO | Source: Ambulatory Visit | Attending: Radiation Oncology | Admitting: Radiation Oncology

## 2014-01-10 ENCOUNTER — Ambulatory Visit
Admission: RE | Admit: 2014-01-10 | Discharge: 2014-01-10 | Disposition: A | Discharge status: Home / Self Care | Payer: Medicare PPO | Source: Ambulatory Visit | Attending: Radiation Oncology | Admitting: Radiation Oncology

## 2014-01-10 ENCOUNTER — Ambulatory Visit: Payer: Medicare PPO

## 2014-01-13 ENCOUNTER — Ambulatory Visit
Admission: RE | Admit: 2014-01-13 | Discharge: 2014-01-13 | Disposition: A | Discharge status: Home / Self Care | Payer: Medicare PPO | Source: Ambulatory Visit | Attending: Radiation Oncology | Admitting: Radiation Oncology

## 2014-01-13 ENCOUNTER — Ambulatory Visit: Payer: Medicare PPO

## 2014-01-14 ENCOUNTER — Ambulatory Visit
Admission: RE | Admit: 2014-01-14 | Discharge: 2014-01-14 | Disposition: A | Discharge status: Home / Self Care | Payer: Medicare PPO | Source: Ambulatory Visit | Attending: Radiation Oncology | Admitting: Radiation Oncology

## 2014-01-14 ENCOUNTER — Ambulatory Visit: Payer: Medicare PPO

## 2014-01-15 ENCOUNTER — Ambulatory Visit
Admission: RE | Admit: 2014-01-15 | Discharge: 2014-01-15 | Disposition: A | Discharge status: Home / Self Care | Payer: Medicare PPO | Source: Ambulatory Visit | Attending: Radiation Oncology | Admitting: Radiation Oncology

## 2014-01-15 ENCOUNTER — Ambulatory Visit: Payer: Medicare PPO

## 2014-01-16 ENCOUNTER — Ambulatory Visit: Payer: Medicare PPO

## 2014-01-16 ENCOUNTER — Ambulatory Visit
Admission: RE | Admit: 2014-01-16 | Discharge: 2014-01-16 | Disposition: A | Discharge status: Home / Self Care | Payer: Medicare PPO | Source: Ambulatory Visit | Attending: Radiation Oncology | Admitting: Radiation Oncology

## 2014-01-16 ENCOUNTER — Encounter: Payer: Self-pay | Admitting: Radiation Oncology

## 2014-01-16 VITALS — BP 154/88 | HR 104 | Resp 16 | Wt 212.5 lb

## 2014-01-16 DIAGNOSIS — C61 Malignant neoplasm of prostate: Secondary | ICD-10-CM

## 2014-01-16 NOTE — Progress Notes (Signed)
  Radiation Oncology         [phone] ________________________________  Name: Tommy Dyer MRN: [id number]  Date: 01/16/2014  DOB: January 16, 1936    Weekly Radiation Therapy Management  Current Dose: 72.2 Gy     Planned Dose:  76 Gy  Narrative . . . . . . . . The patient presents for routine under treatment assessment.                                   Reports loose stool. Reports he noted blood in his stool this morning for the first time in seven days. Reports he has stopped anusol because rectal irritation ceased. Reports fatigue. States, "I just feel lousy today." Reports nocturia x3. Reports stinging sensation when he urinates. Reports flomax helps a lot                                 Set-up films were reviewed.                                 The chart was checked. Physical Findings. . .  weight is 212 lb 8 oz (96.389 kg). His blood pressure is 154/88 and his pulse is 104. His respiration is 16. . Weight essentially stable.  No significant changes. Impression . . . . . . . The patient is tolerating radiation. Plan . . . . . . . . . . . . Continue treatment as planned.  ________________________________  Sheral Apley. Tammi Klippel, M.D.

## 2014-01-16 NOTE — Progress Notes (Signed)
Reports loose stool. Reports he noted blood in his stool this morning for the first time in seven days. Reports he has stopped anusol because rectal irritation ceased. Reports fatigue. States, "I just feel lousy today." Reports nocturia x3. Reports stinging sensation when he urinates. Reports flomax helps a lot.

## 2014-01-17 ENCOUNTER — Ambulatory Visit: Payer: Medicare PPO

## 2014-01-17 ENCOUNTER — Ambulatory Visit
Admission: RE | Admit: 2014-01-17 | Discharge: 2014-01-17 | Disposition: A | Discharge status: Home / Self Care | Payer: Medicare PPO | Source: Ambulatory Visit | Attending: Radiation Oncology | Admitting: Radiation Oncology

## 2014-01-17 ENCOUNTER — Ambulatory Visit: Admission: RE | Admit: 2014-01-17 | Payer: Medicare PPO | Source: Ambulatory Visit

## 2014-01-20 ENCOUNTER — Encounter: Payer: Self-pay | Admitting: Radiation Oncology

## 2014-01-20 ENCOUNTER — Ambulatory Visit
Admission: RE | Admit: 2014-01-20 | Discharge: 2014-01-20 | Disposition: A | Discharge status: Home / Self Care | Payer: Medicare PPO | Source: Ambulatory Visit | Attending: Radiation Oncology | Admitting: Radiation Oncology

## 2014-01-21 ENCOUNTER — Ambulatory Visit: Payer: Medicare PPO

## 2014-01-21 NOTE — Progress Notes (Signed)
°  Radiation Oncology         [phone]) [phone] ________________________________  Name: DMONI CAUSBY MRN: [id number]  Date: 01/20/2014  DOB: November 27, 1935  End of Treatment Note  Diagnosis:   78 y.o. gentleman with stage T1c adenocarcinoma of the prostate with a Gleason's score of 3+3 and a PSA of 5.04  Indication for treatment:  Curative       Radiation treatment dates:   11/25/2013-01/20/2014  Site/dose:   The prostate was treated to 76 Gy in 40 fractions of 1.9 Gy  Beams/energy:   He received image-guided (conebeam CT) intensity modulated (2 RapidArcs) radiotherapy using 6 MV X-Rays.  Narrative: The patient tolerated radiation treatment relatively well.   He had voiding symptoms which responded to Flomax, and also rectal discomfort which was treated with Anusol.   Plan: The patient has completed radiation treatment. The patient will return to radiation oncology clinic for routine followup in one month. I advised them to call or return sooner if they have any questions or concerns related to their recovery or treatment. ________________________________  Sheral Apley. Tammi Klippel, M.D.

## 2014-02-05 ENCOUNTER — Telehealth: Payer: Self-pay | Admitting: Radiation Oncology

## 2014-02-05 NOTE — Telephone Encounter (Signed)
Received message that patient has questions. Phoned patient at home. Patient states, "I was told since I had radiation I shouldn't be out in the sun." Explained sun exposure without protection places everyone at risk for skin cancer. Explained to the patient he could enjoy the sun safely by wearing spf 35 or greater. Patient reports he has a productive cough with white sputum each morning. Verbalized this could be allergy related but, to follow up with PCP to be safe since this symptom had no relation to his prostate treatment. Reports nocturia x3 which is no worse than documented on date of completion. Encouraged patient to call with future needs and he verbalized understanding.

## 2014-02-27 ENCOUNTER — Ambulatory Visit
Admission: RE | Admit: 2014-02-27 | Discharge: 2014-02-27 | Disposition: A | Discharge status: Home / Self Care | Payer: Medicare PPO | Source: Ambulatory Visit | Attending: Radiation Oncology | Admitting: Radiation Oncology

## 2014-02-27 ENCOUNTER — Encounter: Payer: Self-pay | Admitting: Radiation Oncology

## 2014-02-27 VITALS — BP 146/86 | HR 98 | Temp 97.8°F | Resp 16 | Wt 210.2 lb

## 2014-02-27 DIAGNOSIS — C61 Malignant neoplasm of prostate: Secondary | ICD-10-CM

## 2014-02-27 NOTE — Progress Notes (Signed)
Radiation Oncology         [phone]) [phone] ________________________________  Name: Tommy Dyer MRN: [id number]  Date: 02/27/2014  DOB: August 12, 1936  Follow-Up Visit Note  CC: Kandice Hams, MD  Arvil Persons, MD  Diagnosis:   78 y.o. gentleman with stage T1c adenocarcinoma of the prostate with a Gleason's score of 3+3 and a PSA of 5.04 s/p IMRT 11/25/2013-01/20/2014 to 76 Gy   Interval Since Last Radiation:  4  weeks  Narrative:  The patient returns today for routine follow-up.  Reports that he continues to take flomax as directed. Reports nocturia x 2-4. Denies rectal irritation. Reports loose stool but, denies diarrhea. Reports urgency. Denies hematuria. Denies blood in stool or pain associated with bowel movements. Reports gradual improvement of energy                             ALLERGIES:  has No Known Allergies.  Meds: Current Outpatient Prescriptions  Medication Sig Dispense Refill  . acetaminophen (TYLENOL) 325 MG tablet Take 650 mg by mouth every 6 (six) hours as needed.      Marland Kitchen amLODipine (NORVASC) 10 MG tablet Take 10 mg by mouth daily.      Marland Kitchen aspirin 81 MG tablet Take 81 mg by mouth daily.      . Calcium Carbonate-Vitamin D (CALCIUM + D PO) Take by mouth.      . fenofibrate 54 MG tablet Take 54 mg by mouth daily.      . mometasone (NASONEX) 50 MCG/ACT nasal spray Place 2 sprays into the nose daily.      . psyllium (METAMUCIL) 58.6 % powder Take 1 packet by mouth 3 (three) times daily.      . tamsulosin (FLOMAX) 0.4 MG CAPS capsule Take 1 capsule (0.4 mg total) by mouth daily after supper.  30 capsule  5  . calcium & magnesium carbonates (MYLANTA) 311-232 MG per tablet Take 1 tablet by mouth daily.      . hydrocortisone (ANUSOL-HC) 2.5 % rectal cream Place 1 application rectally 2 (two) times daily. Apply to rectal area twice daily as needed. 1 tube 1 refill Called into [facility]      . hydrocortisone (ANUSOL-HC) 25 MG suppository Place 1 suppository (25 mg total)  rectally 2 (two) times daily as needed (bleeding).  20 suppository  3  . meclizine (ANTIVERT) 25 MG tablet Take 25 mg by mouth 3 (three) times daily as needed for dizziness.      . ranitidine (ZANTAC) 150 MG tablet Take 150 mg by mouth 2 (two) times daily.      Marland Kitchen testosterone (ANDROGEL) 50 MG/5GM GEL Place 5 g onto the skin daily.       No current facility-administered medications for this encounter.    Physical Findings: The patient is in no acute distress. Patient is alert and oriented.  weight is 210 lb 3.2 oz (95.346 kg). His oral temperature is 97.8 F (36.6 C). His blood pressure is 146/86 and his pulse is 98. His respiration is 16. .  No significant changes.  Impression:  The patient is recovering from the effects of radiation.    Plan:  He will continue to follow-up with urology for ongoing PSA determinations.  I will look forward to following his response through their correspondence, and be happy to participate in care if clinically indicated.  I talked to the patient about what to expect in the future, including his  risk for erectile dysfunction and rectal bleeding.  I encouraged him to call or return to the office if he has any question about his previous radiation or possible radiation effects.  He was comfortable with this plan.  _____________________________________  Sheral Apley. Tammi Klippel, M.D.

## 2014-02-27 NOTE — Progress Notes (Signed)
Reports that he continues to take flomax as directed. Reports nocturia x 2-4. Denies rectal irritation. Reports loose stool but, denies diarrhea. Reports urgency. Denies hematuria. Denies blood in stool or pain associated with bowel movements. Reports gradual improvement of energy.

## 2014-06-11 ENCOUNTER — Other Ambulatory Visit: Payer: Self-pay | Admitting: Radiation Oncology

## 2015-06-17 ENCOUNTER — Other Ambulatory Visit: Payer: Self-pay | Admitting: Radiation Oncology

## 2016-09-28 DIAGNOSIS — J0110 Acute frontal sinusitis, unspecified: Secondary | ICD-10-CM | POA: Diagnosis not present

## 2016-10-27 DIAGNOSIS — H04123 Dry eye syndrome of bilateral lacrimal glands: Secondary | ICD-10-CM | POA: Diagnosis not present

## 2016-10-27 DIAGNOSIS — H2512 Age-related nuclear cataract, left eye: Secondary | ICD-10-CM | POA: Diagnosis not present

## 2016-10-27 DIAGNOSIS — H01009 Unspecified blepharitis unspecified eye, unspecified eyelid: Secondary | ICD-10-CM | POA: Diagnosis not present

## 2016-10-28 ENCOUNTER — Other Ambulatory Visit: Payer: Self-pay | Admitting: Radiation Oncology

## 2016-11-09 DIAGNOSIS — J111 Influenza due to unidentified influenza virus with other respiratory manifestations: Secondary | ICD-10-CM | POA: Diagnosis not present

## 2017-01-31 DIAGNOSIS — C61 Malignant neoplasm of prostate: Secondary | ICD-10-CM | POA: Diagnosis not present

## 2017-02-07 DIAGNOSIS — R102 Pelvic and perineal pain: Secondary | ICD-10-CM | POA: Diagnosis not present

## 2017-02-07 DIAGNOSIS — C61 Malignant neoplasm of prostate: Secondary | ICD-10-CM | POA: Diagnosis not present

## 2017-02-07 DIAGNOSIS — N200 Calculus of kidney: Secondary | ICD-10-CM | POA: Diagnosis not present

## 2017-05-16 DIAGNOSIS — I10 Essential (primary) hypertension: Secondary | ICD-10-CM | POA: Diagnosis not present

## 2017-05-16 DIAGNOSIS — R7989 Other specified abnormal findings of blood chemistry: Secondary | ICD-10-CM | POA: Diagnosis not present

## 2017-05-16 DIAGNOSIS — Z0000 Encounter for general adult medical examination without abnormal findings: Secondary | ICD-10-CM | POA: Diagnosis not present

## 2017-05-16 DIAGNOSIS — E663 Overweight: Secondary | ICD-10-CM | POA: Diagnosis not present

## 2017-05-16 DIAGNOSIS — Z1389 Encounter for screening for other disorder: Secondary | ICD-10-CM | POA: Diagnosis not present

## 2017-05-16 DIAGNOSIS — C61 Malignant neoplasm of prostate: Secondary | ICD-10-CM | POA: Diagnosis not present

## 2017-05-16 DIAGNOSIS — Z6830 Body mass index (BMI) 30.0-30.9, adult: Secondary | ICD-10-CM | POA: Diagnosis not present

## 2017-05-16 DIAGNOSIS — E7800 Pure hypercholesterolemia, unspecified: Secondary | ICD-10-CM | POA: Diagnosis not present

## 2017-05-30 DIAGNOSIS — N183 Chronic kidney disease, stage 3 (moderate): Secondary | ICD-10-CM | POA: Diagnosis not present

## 2017-05-30 DIAGNOSIS — R799 Abnormal finding of blood chemistry, unspecified: Secondary | ICD-10-CM | POA: Diagnosis not present

## 2017-06-28 DIAGNOSIS — Z23 Encounter for immunization: Secondary | ICD-10-CM | POA: Diagnosis not present

## 2017-07-05 DIAGNOSIS — D485 Neoplasm of uncertain behavior of skin: Secondary | ICD-10-CM | POA: Diagnosis not present

## 2017-07-05 DIAGNOSIS — L989 Disorder of the skin and subcutaneous tissue, unspecified: Secondary | ICD-10-CM | POA: Diagnosis not present

## 2017-08-07 DIAGNOSIS — C61 Malignant neoplasm of prostate: Secondary | ICD-10-CM | POA: Diagnosis not present

## 2017-08-21 DIAGNOSIS — R102 Pelvic and perineal pain: Secondary | ICD-10-CM | POA: Diagnosis not present

## 2017-08-21 DIAGNOSIS — N200 Calculus of kidney: Secondary | ICD-10-CM | POA: Diagnosis not present

## 2017-08-21 DIAGNOSIS — C61 Malignant neoplasm of prostate: Secondary | ICD-10-CM | POA: Diagnosis not present

## 2017-08-29 DIAGNOSIS — H04123 Dry eye syndrome of bilateral lacrimal glands: Secondary | ICD-10-CM | POA: Diagnosis not present

## 2017-08-29 DIAGNOSIS — H3554 Dystrophies primarily involving the retinal pigment epithelium: Secondary | ICD-10-CM | POA: Diagnosis not present

## 2017-08-29 DIAGNOSIS — H2512 Age-related nuclear cataract, left eye: Secondary | ICD-10-CM | POA: Diagnosis not present

## 2017-09-08 DIAGNOSIS — N183 Chronic kidney disease, stage 3 (moderate): Secondary | ICD-10-CM | POA: Diagnosis not present

## 2017-11-15 DIAGNOSIS — H04123 Dry eye syndrome of bilateral lacrimal glands: Secondary | ICD-10-CM | POA: Diagnosis not present

## 2017-11-15 DIAGNOSIS — H2512 Age-related nuclear cataract, left eye: Secondary | ICD-10-CM | POA: Diagnosis not present

## 2017-12-14 DIAGNOSIS — H04212 Epiphora due to excess lacrimation, left lacrimal gland: Secondary | ICD-10-CM | POA: Diagnosis not present

## 2017-12-14 DIAGNOSIS — H2513 Age-related nuclear cataract, bilateral: Secondary | ICD-10-CM | POA: Diagnosis not present

## 2018-01-18 DIAGNOSIS — R0789 Other chest pain: Secondary | ICD-10-CM | POA: Diagnosis not present

## 2018-01-18 DIAGNOSIS — L1230 Acquired epidermolysis bullosa, unspecified: Secondary | ICD-10-CM | POA: Diagnosis not present

## 2018-01-25 DIAGNOSIS — H2512 Age-related nuclear cataract, left eye: Secondary | ICD-10-CM | POA: Diagnosis not present

## 2018-01-25 DIAGNOSIS — H16223 Keratoconjunctivitis sicca, not specified as Sjogren's, bilateral: Secondary | ICD-10-CM | POA: Diagnosis not present

## 2018-02-01 ENCOUNTER — Other Ambulatory Visit: Payer: Self-pay | Admitting: Urology

## 2018-02-01 DIAGNOSIS — N200 Calculus of kidney: Secondary | ICD-10-CM

## 2018-02-02 ENCOUNTER — Ambulatory Visit (HOSPITAL_COMMUNITY)
Admission: RE | Admit: 2018-02-02 | Discharge: 2018-02-02 | Disposition: A | Discharge status: Home / Self Care | Payer: Medicare HMO | Source: Ambulatory Visit | Attending: Urology | Admitting: Urology

## 2018-02-02 DIAGNOSIS — N200 Calculus of kidney: Secondary | ICD-10-CM

## 2018-02-05 DIAGNOSIS — N200 Calculus of kidney: Secondary | ICD-10-CM | POA: Diagnosis not present

## 2018-02-05 DIAGNOSIS — C61 Malignant neoplasm of prostate: Secondary | ICD-10-CM | POA: Diagnosis not present

## 2018-04-05 DIAGNOSIS — D1779 Benign lipomatous neoplasm of other sites: Secondary | ICD-10-CM | POA: Diagnosis not present

## 2018-05-01 DIAGNOSIS — H2512 Age-related nuclear cataract, left eye: Secondary | ICD-10-CM | POA: Diagnosis not present

## 2018-05-01 DIAGNOSIS — H04123 Dry eye syndrome of bilateral lacrimal glands: Secondary | ICD-10-CM | POA: Diagnosis not present

## 2018-05-17 DIAGNOSIS — R310 Gross hematuria: Secondary | ICD-10-CM | POA: Diagnosis not present

## 2018-05-18 DIAGNOSIS — R310 Gross hematuria: Secondary | ICD-10-CM | POA: Diagnosis not present

## 2018-05-18 DIAGNOSIS — N132 Hydronephrosis with renal and ureteral calculous obstruction: Secondary | ICD-10-CM | POA: Diagnosis not present

## 2018-05-24 DIAGNOSIS — I10 Essential (primary) hypertension: Secondary | ICD-10-CM | POA: Diagnosis not present

## 2018-05-24 DIAGNOSIS — C61 Malignant neoplasm of prostate: Secondary | ICD-10-CM | POA: Diagnosis not present

## 2018-05-24 DIAGNOSIS — Z7189 Other specified counseling: Secondary | ICD-10-CM | POA: Diagnosis not present

## 2018-05-24 DIAGNOSIS — Z0000 Encounter for general adult medical examination without abnormal findings: Secondary | ICD-10-CM | POA: Diagnosis not present

## 2018-05-24 DIAGNOSIS — N183 Chronic kidney disease, stage 3 (moderate): Secondary | ICD-10-CM | POA: Diagnosis not present

## 2018-05-24 DIAGNOSIS — R319 Hematuria, unspecified: Secondary | ICD-10-CM | POA: Diagnosis not present

## 2018-05-24 DIAGNOSIS — Z1389 Encounter for screening for other disorder: Secondary | ICD-10-CM | POA: Diagnosis not present

## 2018-05-24 DIAGNOSIS — Z23 Encounter for immunization: Secondary | ICD-10-CM | POA: Diagnosis not present

## 2018-05-24 DIAGNOSIS — K921 Melena: Secondary | ICD-10-CM | POA: Diagnosis not present

## 2018-06-01 DIAGNOSIS — N200 Calculus of kidney: Secondary | ICD-10-CM | POA: Diagnosis not present

## 2018-06-05 DIAGNOSIS — H2512 Age-related nuclear cataract, left eye: Secondary | ICD-10-CM | POA: Diagnosis not present

## 2018-06-05 DIAGNOSIS — H16223 Keratoconjunctivitis sicca, not specified as Sjogren's, bilateral: Secondary | ICD-10-CM | POA: Diagnosis not present

## 2018-06-06 ENCOUNTER — Other Ambulatory Visit: Payer: Self-pay | Admitting: Urology

## 2018-06-15 ENCOUNTER — Encounter (HOSPITAL_COMMUNITY): Payer: Self-pay | Admitting: *Deleted

## 2018-06-21 ENCOUNTER — Ambulatory Visit (HOSPITAL_COMMUNITY): Payer: Medicare HMO

## 2018-06-21 ENCOUNTER — Encounter (HOSPITAL_COMMUNITY)
Admission: RE | Disposition: A | Discharge status: Home / Self Care | Payer: Self-pay | Source: Other Acute Inpatient Hospital | Attending: Urology

## 2018-06-21 ENCOUNTER — Ambulatory Visit (HOSPITAL_COMMUNITY)
Admission: RE | Admit: 2018-06-21 | Discharge: 2018-06-21 | Disposition: A | Discharge status: Home / Self Care | Payer: Medicare HMO | Source: Other Acute Inpatient Hospital | Attending: Urology | Admitting: Urology

## 2018-06-21 ENCOUNTER — Encounter (HOSPITAL_COMMUNITY): Payer: Self-pay | Admitting: General Practice

## 2018-06-21 DIAGNOSIS — Z79899 Other long term (current) drug therapy: Secondary | ICD-10-CM | POA: Insufficient documentation

## 2018-06-21 DIAGNOSIS — Z87891 Personal history of nicotine dependence: Secondary | ICD-10-CM | POA: Diagnosis not present

## 2018-06-21 DIAGNOSIS — Z8546 Personal history of malignant neoplasm of prostate: Secondary | ICD-10-CM | POA: Diagnosis not present

## 2018-06-21 DIAGNOSIS — Z87442 Personal history of urinary calculi: Secondary | ICD-10-CM | POA: Diagnosis not present

## 2018-06-21 DIAGNOSIS — Z833 Family history of diabetes mellitus: Secondary | ICD-10-CM | POA: Insufficient documentation

## 2018-06-21 DIAGNOSIS — N201 Calculus of ureter: Secondary | ICD-10-CM | POA: Diagnosis not present

## 2018-06-21 DIAGNOSIS — Z8711 Personal history of peptic ulcer disease: Secondary | ICD-10-CM | POA: Insufficient documentation

## 2018-06-21 DIAGNOSIS — I10 Essential (primary) hypertension: Secondary | ICD-10-CM | POA: Diagnosis not present

## 2018-06-21 DIAGNOSIS — N202 Calculus of kidney with calculus of ureter: Secondary | ICD-10-CM | POA: Diagnosis not present

## 2018-06-21 DIAGNOSIS — K219 Gastro-esophageal reflux disease without esophagitis: Secondary | ICD-10-CM | POA: Insufficient documentation

## 2018-06-21 DIAGNOSIS — E7800 Pure hypercholesterolemia, unspecified: Secondary | ICD-10-CM | POA: Insufficient documentation

## 2018-06-21 HISTORY — PX: EXTRACORPOREAL SHOCK WAVE LITHOTRIPSY: SHX1557

## 2018-06-21 HISTORY — DX: Personal history of urinary calculi: Z87.442

## 2018-06-21 SURGERY — LITHOTRIPSY, ESWL
Anesthesia: LOCAL | Laterality: Right

## 2018-06-21 MED ORDER — DIAZEPAM 5 MG PO TABS
10.0000 mg | ORAL_TABLET | ORAL | Status: AC
  Administered 2018-06-21: 10 mg via ORAL
  Filled 2018-06-21: qty 2

## 2018-06-21 MED ORDER — TRAMADOL HCL 50 MG PO TABS: 50.0000 mg | ORAL_TABLET | Freq: Four times a day (QID) | ORAL | Status: DC | PRN

## 2018-06-21 MED ORDER — CIPROFLOXACIN HCL 500 MG PO TABS
500.0000 mg | ORAL_TABLET | ORAL | Status: AC
  Administered 2018-06-21: 500 mg via ORAL
  Filled 2018-06-21: qty 1

## 2018-06-21 MED ORDER — SODIUM CHLORIDE 0.9 % IV SOLN
INTRAVENOUS | Status: DC
  Administered 2018-06-21: 07:00:00 via INTRAVENOUS

## 2018-06-21 MED ORDER — DIPHENHYDRAMINE HCL 25 MG PO CAPS
25.0000 mg | ORAL_CAPSULE | ORAL | Status: AC
  Administered 2018-06-21: 25 mg via ORAL
  Filled 2018-06-21: qty 1

## 2018-06-21 NOTE — Op Note (Signed)
See [facility] OP note scanned into chart. Also because of the size, density, location and other factors that cannot be anticipated I feel this will likely be a staged procedure. This fact supersedes any indication in the scanned [facility] operative note to the contrary.  

## 2018-06-21 NOTE — Discharge Instructions (Signed)
See [facility] discharge instructions in chart.  

## 2018-06-21 NOTE — H&P (Signed)
I have kidney stones.  HPI: Tommy Dyer is a 82 year-old male established patient who is here for renal calculi.  He first stated noticing pain on approximately 07/28/2011. This is not his first kidney stone. He has had more than 5 stones prior to getting this one. He is not currently having flank pain, back pain, groin pain, nausea, vomiting, fever or chills. He has caught a stone in his urine strainer since his symptoms began.   He has never had surgical treatment for calculi in the past.   07/28/2016: He had 1 stone event 8 weeks ago and did not have flank pain at the time. He had new onset urgency, frequency and dysuria. He then passed a small stone the next day.   02/07/2017: No stone events since last visit.   08/21/2017: He passed a stone on Oct 16th. NO issues since passing the stone.   02/05/2018: KUB and renal US show 1.5cm right upper pole renal mass. no hydronephrosis   05/31/2018: KUB today shows right 1cm right ureteral calculus. The patient continues to have intermittent gross hematuria     ALLERGIES: No Allergies    MEDICATIONS: Tamsulosin Hcl 0.4 mg capsule 1 capsule PO Daily  Acetaminophen TABS Oral  Amlodipine Besylate 10 mg tablet  Fenofibrate 54 MG Oral Tablet Oral  Fluticasone Propionate 50 mcg/actuation spray, suspension  Mylanta  Pepcid  Refresh Optive  Saline Nasal Spray  Stool Softener     GU PSH: Locm 300-399Mg/Ml Iodine,1Ml - 05/18/2018      PSH Notes: Cataract Surgery, Colonoscopy (Fiberoptic) Wisdom teeth removal Sept of 2017   NON-GU PSH: Cataract Surgery.. Dermatological Procedure, removal of mole- left arm Diagnostic Colonoscopy - 2011/12/04    GU PMH: Gross hematuria - 05/17/2018 Prostate Cancer - 02/05/2018, - 08/21/2017, - 02/07/2017, - 07/28/2016, 04-Dec-2015, Adenocarcinoma of prostate, - Dec 04, 2015 Renal calculus - 02/05/2018, - 08/21/2017, - 02/07/2017, - 07/28/2016, Nephrolithiasis, - Dec 04, 2015 Pelvic/perineal pain - 08/21/2017, - 02/07/2017 ED due to arterial  insufficiency - 12/04/2015, Erectile dysfunction due to arterial insufficiency, - 2015/12/04 Elevated PSA, Elevated prostate specific antigen (PSA) - 2014-12-03 Other microscopic hematuria, Microscopic hematuria - 12/03/14 Encounter for Prostate Cancer screening, Prostate cancer screening - 2012/12/03 History of urolithiasis, Nephrolithiasis - Dec 03, 2012 Renal cyst, Renal cyst, acquired - Dec 03, 2012 Urinary Frequency, Urinary frequency - 12-03-2012      PMH Notes:  2012-04-26 11:28:43 - Note: Arthritis   NON-GU PMH: Encounter for general adult medical examination without abnormal findings, Encounter for preventive health examination - Dec 04, 2015 Gastric ulcer, unspecified as acute or chronic, without hemorrhage or perforation, Gastric Ulcer - 2012-12-03 Personal history of other diseases of the circulatory system, History of hypertension - 2012/12/03 Personal history of other diseases of the digestive system, History of esophageal reflux - Dec 03, 2012 Personal history of other endocrine, nutritional and metabolic disease, History of hypercholesterolemia - 2012/12/03    FAMILY HISTORY: Death In The Family Father - Runs In Family Death In The Family Mother - Runs In Family Diabetes - Runs In Family No pertinent family history - Other   SOCIAL HISTORY: Marital Status: Married Preferred Language: English; Ethnicity: Not Hispanic Or Latino; Race: Black or African American Current Smoking Status: Patient does not smoke anymore. Has not smoked since 05/27/1988.   Tobacco Use Assessment Completed: Used Tobacco in last 30 days? Has never drank.  Does not drink caffeine. Patient's occupation is/was Retired.    REVIEW OF SYSTEMS:    GU Review Male:   Patient reports frequent urination and get up  at night to urinate. Patient denies hard to postpone urination, burning/ pain with urination, leakage of urine, stream starts and stops, trouble starting your stream, have to strain to urinate , erection problems, and penile pain.  Gastrointestinal (Upper):   Patient reports  indigestion/ heartburn. Patient denies nausea and vomiting.  Gastrointestinal (Lower):   Patient reports constipation. Patient denies diarrhea.  Constitutional:   Patient reports night sweats and weight loss. Patient denies fever and fatigue.  Skin:   Patient reports itching. Patient denies skin rash/ lesion.  Eyes:   Patient denies blurred vision and double vision.  Ears/ Nose/ Throat:   Patient reports sinus problems. Patient denies sore throat.  Hematologic/Lymphatic:   Patient denies swollen glands and easy bruising.  Cardiovascular:   Patient denies leg swelling and chest pains.  Respiratory:   Patient reports cough. Patient denies shortness of breath.  Endocrine:   Patient denies excessive thirst.  Musculoskeletal:   Patient reports back pain and joint pain.   Neurological:   Patient denies headaches and dizziness.  Psychologic:   Patient reports depression. Patient denies anxiety.   VITAL SIGNS:      06/01/2018 08:01 AM  Weight 209 lb / 94.8 kg  Height 72 in / 182.88 cm  BP 137/80 mmHg  Pulse 101 /min  Temperature 98.2 F / 36.7 C  BMI 28.3 kg/m   MULTI-SYSTEM PHYSICAL EXAMINATION:    Constitutional: Well-nourished. No physical deformities. Normally developed. Good grooming.  Neck: Neck symmetrical, not swollen. Normal tracheal position.  Respiratory: No labored breathing, no use of accessory muscles.   Cardiovascular: Normal temperature, normal extremity pulses, no swelling, no varicosities.  Lymphatic: No enlargement of neck, axillae, groin.  Skin: No paleness, no jaundice, no cyanosis. No lesion, no ulcer, no rash.  Neurologic / Psychiatric: Oriented to time, oriented to place, oriented to person. No depression, no anxiety, no agitation.  Gastrointestinal: No mass, no tenderness, no rigidity, non obese abdomen.  Musculoskeletal: Normal gait and station of head and neck.     PAST DATA REVIEWED:  Source Of History:  Patient  X-Ray Review: C.T. Hematuria: Reviewed Films.  Reviewed Report. Discussed With Patient.     02/01/18 08/07/17 01/31/17 07/21/16 04/01/16 12/11/15 08/14/15 05/15/15  PSA  Total PSA 0.43 ng/mL 0.37 ng/mL 0.25 ng/dl 0.19 ng/dl 0.23  0.52  0.21  0.24     PROCEDURES:         KUB - [id number]  A single view of the abdomen is obtained.  Bony Abnormalities:  no bony abnormalities  Fecal Stasis:  Mild fecal stasis.  Soft Tissue:  no soft tissue abnormalities  Calculi:  1cm right proximal ureteral calculus               Urinalysis w/Scope Dipstick Dipstick Cont'd Micro  Color: Yellow Bilirubin: Neg mg/dL WBC/hpf: 0 - 5/hpf  Appearance: Clear Ketones: Neg mg/dL RBC/hpf: 3 - 10/hpf  Specific Gravity: 1.025 Blood: 3+ ery/uL Bacteria: NS (Not Seen)  pH: 5.5 Protein: 3+ mg/dL Cystals: NS (Not Seen)  Glucose: Neg mg/dL Urobilinogen: 0.2 mg/dL Casts: NS (Not Seen)    Nitrites: Neg Trichomonas: Not Present    Leukocyte Esterase: Neg leu/uL Mucous: Not Present      Epithelial Cells: 0 - 5/hpf      Yeast: NS (Not Seen)      Sperm: Not Present    ASSESSMENT:      ICD-10 Details  1 GU:   Renal calculus - N20.0    PLAN:  Orders X-Rays: KUB          Schedule         Document Letter(s):  Created for Patient: Clinical Summary         Notes:   right ureteral calculus:  We discussed MET versus URS versus ESWL and after discussing the options the patient elects fro ESWL

## 2018-06-22 ENCOUNTER — Encounter (HOSPITAL_COMMUNITY): Payer: Self-pay | Admitting: Urology

## 2018-07-04 DIAGNOSIS — N200 Calculus of kidney: Secondary | ICD-10-CM | POA: Diagnosis not present

## 2018-07-17 DIAGNOSIS — N200 Calculus of kidney: Secondary | ICD-10-CM | POA: Diagnosis not present

## 2018-07-31 DIAGNOSIS — C61 Malignant neoplasm of prostate: Secondary | ICD-10-CM | POA: Diagnosis not present

## 2018-08-07 DIAGNOSIS — N200 Calculus of kidney: Secondary | ICD-10-CM | POA: Diagnosis not present

## 2018-09-06 DIAGNOSIS — H04123 Dry eye syndrome of bilateral lacrimal glands: Secondary | ICD-10-CM | POA: Diagnosis not present

## 2018-09-06 DIAGNOSIS — H2512 Age-related nuclear cataract, left eye: Secondary | ICD-10-CM | POA: Diagnosis not present

## 2018-09-24 DIAGNOSIS — H2181 Floppy iris syndrome: Secondary | ICD-10-CM | POA: Diagnosis not present

## 2018-09-24 DIAGNOSIS — H25812 Combined forms of age-related cataract, left eye: Secondary | ICD-10-CM | POA: Diagnosis not present

## 2018-09-24 DIAGNOSIS — H2512 Age-related nuclear cataract, left eye: Secondary | ICD-10-CM | POA: Diagnosis not present

## 2018-09-25 DIAGNOSIS — H2512 Age-related nuclear cataract, left eye: Secondary | ICD-10-CM | POA: Diagnosis not present

## 2018-10-02 DIAGNOSIS — H04123 Dry eye syndrome of bilateral lacrimal glands: Secondary | ICD-10-CM | POA: Diagnosis not present

## 2018-11-06 DIAGNOSIS — H04123 Dry eye syndrome of bilateral lacrimal glands: Secondary | ICD-10-CM | POA: Diagnosis not present

## 2018-12-11 DIAGNOSIS — J069 Acute upper respiratory infection, unspecified: Secondary | ICD-10-CM | POA: Diagnosis not present

## 2018-12-11 DIAGNOSIS — K921 Melena: Secondary | ICD-10-CM | POA: Diagnosis not present

## 2018-12-11 DIAGNOSIS — K529 Noninfective gastroenteritis and colitis, unspecified: Secondary | ICD-10-CM | POA: Diagnosis not present

## 2019-01-29 DIAGNOSIS — C61 Malignant neoplasm of prostate: Secondary | ICD-10-CM | POA: Diagnosis not present

## 2019-02-05 DIAGNOSIS — C61 Malignant neoplasm of prostate: Secondary | ICD-10-CM | POA: Diagnosis not present

## 2019-02-05 DIAGNOSIS — N200 Calculus of kidney: Secondary | ICD-10-CM | POA: Diagnosis not present

## 2019-02-13 ENCOUNTER — Encounter: Payer: Self-pay | Admitting: General Surgery

## 2019-02-14 ENCOUNTER — Other Ambulatory Visit: Payer: Self-pay

## 2019-02-14 ENCOUNTER — Ambulatory Visit (INDEPENDENT_AMBULATORY_CARE_PROVIDER_SITE_OTHER): Payer: Medicare HMO | Admitting: Gastroenterology

## 2019-02-14 ENCOUNTER — Encounter: Payer: Self-pay | Admitting: Gastroenterology

## 2019-02-14 DIAGNOSIS — K625 Hemorrhage of anus and rectum: Secondary | ICD-10-CM

## 2019-02-14 NOTE — Progress Notes (Signed)
This patient contacted our office requesting a physician telemedicine consultation regarding clinical questions and/or test results. Interactive audio and video telecommunications were attempted between this provider and the patient.  However, this technology failed due to the patient having technical difficulties OR they did not have access to video capabilities.  We continued and completed the visit with audio only.  If new patient, they were referred by see below  Participants on the conference : myself and patient   The patient consented to this consultation and was aware that a charge will be placed through their insurance.  I was in my office and the patient was at home   Encounter time:  Total time 30 minutes, with 22 minutes spent with patient on phone   _____________________________________________________________________________________________              Tommy Dyer Gastroenterology Consult Note:  History: Tommy Dyer 02/14/2019  Referring provider: Nicolette Bang, MD ([facility])  Reason for consult/chief complaint: rectal bleeding  Subjective  HPI:  [name] was seen in phone consultation today for recent onset rectal bleeding.  He reports that for 4 to 6 weeks he has had intermittent painless rectal bleeding, sometimes on the paper or in the toilet. His bowel habits are regular without straining, and he denies chronic abdominal pain.  He denies chronic upper digestive symptoms as well. He brought this to the attention of his urologist during a recent routine follow-up for his history of prostate cancer, and was then referred to [place].  I have the office note from that encounter, does not appear that a rectal exam was done.  He also reports that he did not have a rectal exam during his last primary care visit within the last few months.  He has lately been using Preparation H cream which he has been administering into the anal canal with a rubber glove, and  finds that that settles down the bleeding.  He has now not had bleeding in a few days, and plans to use that cream when the bleeding recurs.  His urologist seems to have had some concern that this may be related to prior XRT for prostate cancer.   ROS:  Review of Systems  He denies chest pain dyspnea or dysuria   past Medical History: Past Medical History:  Diagnosis Date  . Arthritis   . Gastric ulcer   . GERD (gastroesophageal reflux disease)   . History of kidney stones   . Hypercholesterolemia   . Hypertension   . Kidney stones   . Prostate cancer Tommy Dyer)    This patient's last colonoscopy report on file is from 2006, at which time Dr. Velora Dyer found diverticulosis and multiple diminutive left-sided colon polyps.  They were removed by hot biopsy, there are no pathology reports available.  Past Surgical History: Past Surgical History:  Procedure Laterality Date  . colonscopy    . EXTRACORPOREAL SHOCK WAVE LITHOTRIPSY Right 06/21/2018   Procedure: RIGHT EXTRACORPOREAL SHOCK WAVE LITHOTRIPSY (ESWL);  Surgeon: Tommy Hughs, MD;  Location: [facility];  Service: Urology;  Laterality: Right;  . PROSTATE BIOPSY    . PROSTATE BIOPSY       Family History: Family History  Problem Relation Age of Onset  . Cancer Mother        unknown  . Cancer Maternal Grandmother        breast    Social History: Social History   Socioeconomic History  . Marital status: Married    Spouse name: Not  on file  . Number of children: Not on file  . Years of education: Not on file  . Highest education level: Not on file  Occupational History  . Not on file  Social Needs  . Financial resource strain: Not on file  . Food insecurity:    Worry: Not on file    Inability: Not on file  . Transportation needs:    Medical: Not on file    Non-medical: Not on file  Tobacco Use  . Smoking status: Former Smoker    Packs/day: 1.00    Years: 20.00    Pack years: 20.00    Last attempt to quit:  09/27/2003    Years since quitting: 15.3  . Smokeless tobacco: Never Used  Substance and Sexual Activity  . Alcohol use: No  . Drug use: No  . Sexual activity: Not on file  Lifestyle  . Physical activity:    Days per week: Not on file    Minutes per session: Not on file  . Stress: Not on file  Relationships  . Social connections:    Talks on phone: Not on file    Gets together: Not on file    Attends religious service: Not on file    Active member of club or organization: Not on file    Attends meetings of clubs or organizations: Not on file    Relationship status: Not on file  Other Topics Concern  . Not on file  Social History Narrative  . Not on file    Allergies: No Known Allergies  Outpatient Meds: Current Outpatient Medications  Medication Sig Dispense Refill  . acetaminophen (TYLENOL) 325 MG tablet Take 650 mg by mouth every 6 (six) hours as needed.    Marland Kitchen amLODipine (NORVASC) 10 MG tablet Take 10 mg by mouth daily.    Marland Kitchen aspirin 81 MG tablet Take 81 mg by mouth daily.    . calcium & magnesium carbonates (MYLANTA) 311-232 MG per tablet Take 1 tablet by mouth daily.    . Calcium Carbonate-Vitamin D (CALCIUM + D PO) Take by mouth.    . famotidine (PEPCID) 10 MG tablet Take 10 mg by mouth as needed for heartburn or indigestion.    . fenofibrate 54 MG tablet Take 54 mg by mouth daily.    . hydrocortisone (ANUSOL-HC) 2.5 % rectal cream Place 1 application rectally 2 (two) times daily. Apply to rectal area twice daily as needed. 1 tube 1 refill Called into [facility]    . hydrocortisone (ANUSOL-HC) 25 MG suppository Place 1 suppository (25 mg total) rectally 2 (two) times daily as needed (bleeding). (Patient not taking: Reported on 02/13/2019) 20 suppository 3  . meclizine (ANTIVERT) 25 MG tablet Take 25 mg by mouth 3 (three) times daily as needed for dizziness.    . mometasone (NASONEX) 50 MCG/ACT nasal spray Place 2 sprays into the nose daily.    . psyllium  (METAMUCIL) 58.6 % powder Take 1 packet by mouth 3 (three) times daily.    . ranitidine (ZANTAC) 150 MG tablet Take 150 mg by mouth 2 (two) times daily.    . tamsulosin (FLOMAX) 0.4 MG CAPS capsule TAKE 1 CAPSULE EVERY DAY  AFTER  SUPPER 90 capsule 1  . testosterone (ANDROGEL) 50 MG/5GM GEL Place 5 g onto the skin daily.     No current facility-administered medications for this visit.       ___________________________________________________________________ Objective   Exam:   No exam - telephone visit  Labs:  PSA normal on 01/29/2019-no additional data for review  Assessment: Encounter Diagnosis  Name Primary?  . Rectal bleeding Yes    This sounds most likely to be hemorrhoidal bleeding or radiation proctoscopy apathy.  Neoplasia is less likely given the reported history.  Nevertheless, he needs in person clinic evaluation including rectal exam and anoscopy.  Plan:  My office will contact him to set up an in person visit with me.  In the meantime he will continue current therapy or try Preparation H suppository if the cream is either not working or he finds it unfeasible.  Thank you for the courtesy of this consult.  Please call me with any questions or concerns.  [name]  CC: Referring provider noted above

## 2019-02-20 ENCOUNTER — Telehealth: Payer: Self-pay

## 2019-02-20 NOTE — Telephone Encounter (Signed)
Covid-19 travel screening questions  Have you traveled in the last 14 days? No  If yes where?  Do you now or have you had a fever in the last 14 days? No  Do you have any respiratory symptoms of shortness of breath or cough now or in the last 14 days? Cough- allergy related   Do you have a medical history of Congestive Heart Failure? No  Do you have a medical history of lung disease? No   Do you have any family members or close contacts with diagnosed or suspected Covid-19? No

## 2019-02-22 ENCOUNTER — Other Ambulatory Visit: Payer: Self-pay

## 2019-02-22 ENCOUNTER — Ambulatory Visit (INDEPENDENT_AMBULATORY_CARE_PROVIDER_SITE_OTHER): Payer: Medicare HMO | Admitting: Gastroenterology

## 2019-02-22 ENCOUNTER — Encounter: Payer: Self-pay | Admitting: Gastroenterology

## 2019-02-22 VITALS — BP 132/74 | HR 108 | Temp 97.7°F | Ht 70.0 in | Wt 210.0 lb

## 2019-02-22 DIAGNOSIS — K625 Hemorrhage of anus and rectum: Secondary | ICD-10-CM

## 2019-02-22 NOTE — Progress Notes (Signed)
Tommy Dyer  Chief Complaint: Rectal bleeding  Subjective  History: Recent telemedicine visit for 4 to 6 weeks of painless rectal bleeding, referred by urology.  Prior XRT for prostate cancer.  Last colonoscopy 2006 with diverticulosis and a few diminutive polyps.  Tommy Dyer has had no bleeding for the last 5 days, and feels that the Preparation H cream has been helpful.  He still occasionally strains for bowel movement, but that the stool is soft.  He finds it difficult to eat a high-fiber diet because it causes bloating.  He will occasionally take Metamucil, but it is limited by the same side effect. He denies pain in the abdomen or rectum with bowel movements.  ROS: Cardiovascular:  no chest pain Respiratory: no dyspnea  The patient's Past Medical, Family and Social History were reviewed and are on file in the EMR.  Objective:  Med list reviewed  Current Outpatient Medications:  .  acetaminophen (TYLENOL) 325 MG tablet, Take 650 mg by mouth every 6 (six) hours as needed., Disp: , Rfl:  .  Alum & Mag Hydroxide-Simeth (MYLANTA PO), Take by mouth., Disp: , Rfl:  .  amLODipine (NORVASC) 10 MG tablet, Take 10 mg by mouth daily., Disp: , Rfl:  .  aspirin 81 MG tablet, Take 81 mg by mouth daily., Disp: , Rfl:  .  calcium & magnesium carbonates (MYLANTA) 311-232 MG per tablet, Take 1 tablet by mouth daily., Disp: , Rfl:  .  Calcium Carbonate-Vitamin D (CALCIUM + D PO), Take by mouth., Disp: , Rfl:  .  Carboxymethylcellulose Sodium (REFRESH TEARS OP), Apply to eye., Disp: , Rfl:  .  docusate sodium (COLACE) 100 MG capsule, Take 100 mg by mouth 2 (two) times daily., Disp: , Rfl:  .  famotidine (PEPCID) 10 MG tablet, Take 10 mg by mouth as needed for heartburn or indigestion., Disp: , Rfl:  .  fenofibrate 54 MG tablet, Take 54 mg by mouth daily., Disp: , Rfl:  .  fluticasone (FLONASE) 50 MCG/ACT nasal spray, Place into both nostrils daily., Disp: , Rfl:  .   Lidocaine-Glycerin (PREPARATION H EX), Apply topically., Disp: , Rfl:  .  meclizine (ANTIVERT) 25 MG tablet, Take 25 mg by mouth 3 (three) times daily as needed for dizziness., Disp: , Rfl:  .  mometasone (NASONEX) 50 MCG/ACT nasal spray, Place 2 sprays into the nose daily., Disp: , Rfl:  .  psyllium (METAMUCIL) 58.6 % powder, Take 1 packet by mouth 3 (three) times daily., Disp: , Rfl:  .  sodium bicarbonate 650 MG tablet, Take 650 mg by mouth 4 (four) times daily., Disp: , Rfl:  .  tamsulosin (FLOMAX) 0.4 MG CAPS capsule, TAKE 1 CAPSULE EVERY DAY  AFTER  SUPPER, Disp: 90 capsule, Rfl: 1   Vital signs in last 24 hrs: Vitals:   02/22/19 1046  BP: 132/74  Pulse: (!) 108  Temp: 97.7 F (36.5 C)    Physical Exam  He is well-appearing  HEENT: sclera anicteric, oral mucosa moist without lesions  Neck: supple, no thyromegaly, JVD or lymphadenopathy  Cardiac: RRR without murmurs, S1S2 heard, no peripheral edema  Pulm: clear to auscultation bilaterally, normal RR and effort noted  Abdomen: soft, no tenderness, with active bowel sounds. No guarding or palpable hepatosplenomegaly.  Skin; warm and dry, no jaundice or rash Rectal: Normal external, no fissure or tenderness or palpable internal lesions. No prostate tenderness   @ASSESSMENTPLANBEGIN @ Assessment: Encounter Diagnosis  Name Primary?  . Rectal bleeding  Yes   He now reports that the bleeding has been going on several months, but that in the last several weeks it had become a little more frequent which is why he brought it to his urologist's attention.  We discussed possible sources, hemorrhoidal, radiation proctoscopy apathy, polyps or cancer.  Its improvement lately with Preparation H cream and the fact that it occurs in the setting of some intermittent constipation favors hemorrhoidal.  Nevertheless, it has been many years since he had a colonoscopy and I feel that he should have one to rule out neoplasia.  We had a long  discussion about that, including the nature procedure, sedation, preparation and risks and benefits.    In the end, he decided not to proceed with colonoscopy at this point.  He would like to give it further consideration and contact me if he changes his mind.  In the meantime, he will continue Preparation H as needed, also elevate feet a little small stool while toileting to help relieve constipation.  Dietary fiber as tolerated.   Total time 28 minutes, over half spent face-to-face with patient in counseling and coordination of care.   [name]

## 2019-02-22 NOTE — Patient Instructions (Signed)
If you are age 81 or older, your body mass index should be between 23-30. Your Body mass index is 30.13 kg/m. If this is out of the aforementioned range listed, please consider follow up with your Primary Care Provider.  If you are age 62 or younger, your body mass index should be between 19-25. Your Body mass index is 30.13 kg/m. If this is out of the aformentioned range listed, please consider follow up with your Primary Care Provider.   If you are age 64 or older, your body mass index should be between 23-30. Your Body mass index is 30.13 kg/m. If this is out of the aforementioned range listed, please consider follow up with your Primary Care Provider.  If you are age 29 or younger, your body mass index should be between 19-25. Your Body mass index is 30.13 kg/m. If this is out of the aformentioned range listed, please consider follow up with your Primary Care Provider.   It has been recommended to you by your physician that you have a colon completed. Per your request, we did not schedule the procedure today. Please contact our office at [phone] should you decide to have the procedure completed.  It was a pleasure to see you today!  Dr. Loletha Carrow

## 2019-05-27 DIAGNOSIS — N183 Chronic kidney disease, stage 3 (moderate): Secondary | ICD-10-CM | POA: Diagnosis not present

## 2019-05-27 DIAGNOSIS — C61 Malignant neoplasm of prostate: Secondary | ICD-10-CM | POA: Diagnosis not present

## 2019-05-27 DIAGNOSIS — Z1389 Encounter for screening for other disorder: Secondary | ICD-10-CM | POA: Diagnosis not present

## 2019-05-27 DIAGNOSIS — E7800 Pure hypercholesterolemia, unspecified: Secondary | ICD-10-CM | POA: Diagnosis not present

## 2019-05-27 DIAGNOSIS — K921 Melena: Secondary | ICD-10-CM | POA: Diagnosis not present

## 2019-05-27 DIAGNOSIS — I10 Essential (primary) hypertension: Secondary | ICD-10-CM | POA: Diagnosis not present

## 2019-05-27 DIAGNOSIS — Z0000 Encounter for general adult medical examination without abnormal findings: Secondary | ICD-10-CM | POA: Diagnosis not present

## 2019-05-28 DIAGNOSIS — E7800 Pure hypercholesterolemia, unspecified: Secondary | ICD-10-CM | POA: Diagnosis not present

## 2019-05-28 DIAGNOSIS — K921 Melena: Secondary | ICD-10-CM | POA: Diagnosis not present

## 2019-05-28 DIAGNOSIS — N183 Chronic kidney disease, stage 3 (moderate): Secondary | ICD-10-CM | POA: Diagnosis not present

## 2019-05-28 DIAGNOSIS — I10 Essential (primary) hypertension: Secondary | ICD-10-CM | POA: Diagnosis not present

## 2019-06-26 DIAGNOSIS — Z23 Encounter for immunization: Secondary | ICD-10-CM | POA: Diagnosis not present

## 2019-07-09 DIAGNOSIS — R21 Rash and other nonspecific skin eruption: Secondary | ICD-10-CM | POA: Diagnosis not present

## 2019-07-30 DIAGNOSIS — C61 Malignant neoplasm of prostate: Secondary | ICD-10-CM | POA: Diagnosis not present

## 2019-07-30 DIAGNOSIS — H04123 Dry eye syndrome of bilateral lacrimal glands: Secondary | ICD-10-CM | POA: Diagnosis not present

## 2019-07-30 DIAGNOSIS — H01001 Unspecified blepharitis right upper eyelid: Secondary | ICD-10-CM | POA: Diagnosis not present

## 2019-08-06 DIAGNOSIS — C61 Malignant neoplasm of prostate: Secondary | ICD-10-CM | POA: Diagnosis not present

## 2019-08-06 DIAGNOSIS — N200 Calculus of kidney: Secondary | ICD-10-CM | POA: Diagnosis not present

## 2019-09-24 DIAGNOSIS — N189 Chronic kidney disease, unspecified: Secondary | ICD-10-CM | POA: Diagnosis not present

## 2019-09-24 DIAGNOSIS — N1831 Chronic kidney disease, stage 3a: Secondary | ICD-10-CM | POA: Diagnosis not present

## 2019-09-24 DIAGNOSIS — C61 Malignant neoplasm of prostate: Secondary | ICD-10-CM | POA: Diagnosis not present

## 2019-09-24 DIAGNOSIS — I10 Essential (primary) hypertension: Secondary | ICD-10-CM | POA: Diagnosis not present

## 2019-09-24 DIAGNOSIS — E7800 Pure hypercholesterolemia, unspecified: Secondary | ICD-10-CM | POA: Diagnosis not present

## 2019-10-02 DIAGNOSIS — H0011 Chalazion right upper eyelid: Secondary | ICD-10-CM | POA: Diagnosis not present

## 2019-10-02 DIAGNOSIS — H04123 Dry eye syndrome of bilateral lacrimal glands: Secondary | ICD-10-CM | POA: Diagnosis not present

## 2019-11-14 DIAGNOSIS — I10 Essential (primary) hypertension: Secondary | ICD-10-CM | POA: Diagnosis not present

## 2019-11-14 DIAGNOSIS — N183 Chronic kidney disease, stage 3 unspecified: Secondary | ICD-10-CM | POA: Diagnosis not present

## 2019-11-14 DIAGNOSIS — N189 Chronic kidney disease, unspecified: Secondary | ICD-10-CM | POA: Diagnosis not present

## 2019-11-14 DIAGNOSIS — C61 Malignant neoplasm of prostate: Secondary | ICD-10-CM | POA: Diagnosis not present

## 2019-11-14 DIAGNOSIS — E7800 Pure hypercholesterolemia, unspecified: Secondary | ICD-10-CM | POA: Diagnosis not present

## 2019-12-10 DIAGNOSIS — Z0100 Encounter for examination of eyes and vision without abnormal findings: Secondary | ICD-10-CM | POA: Diagnosis not present

## 2019-12-10 DIAGNOSIS — H524 Presbyopia: Secondary | ICD-10-CM | POA: Diagnosis not present

## 2019-12-10 DIAGNOSIS — H2513 Age-related nuclear cataract, bilateral: Secondary | ICD-10-CM | POA: Diagnosis not present

## 2020-01-20 ENCOUNTER — Encounter: Payer: Self-pay | Admitting: Podiatry

## 2020-01-20 ENCOUNTER — Ambulatory Visit: Payer: Medicare HMO | Admitting: Podiatry

## 2020-01-20 ENCOUNTER — Other Ambulatory Visit: Payer: Self-pay

## 2020-01-20 VITALS — Temp 97.7°F

## 2020-01-20 DIAGNOSIS — M779 Enthesopathy, unspecified: Secondary | ICD-10-CM

## 2020-01-20 DIAGNOSIS — M7752 Other enthesopathy of left foot: Secondary | ICD-10-CM | POA: Diagnosis not present

## 2020-01-20 DIAGNOSIS — Q828 Other specified congenital malformations of skin: Secondary | ICD-10-CM | POA: Diagnosis not present

## 2020-01-20 NOTE — Progress Notes (Signed)
Subjective:   Patient ID: Tommy Dyer, male   DOB: 84 y.o.   MRN: [id number]   HPI Patient presents after not being seen with for a number years with inflamed area underneath the left foot that is very sore and makes walking difficult.  Patient feels like there is also a keratotic lesion that is part of this and feels like there is fluid around the joint surface.  Patient does not smoke likes to be active   Review of Systems  All other systems reviewed and are negative.       Objective:  Physical Exam Vitals and nursing note reviewed.  Constitutional:      Appearance: He is well-developed.  Pulmonary:     Effort: Pulmonary effort is normal.  Musculoskeletal:        General: Normal range of motion.  Skin:    General: Skin is warm.  Neurological:     Mental Status: He is alert.     Neurovascular status intact muscle strength adequate range of motion within normal range with patient found inflammation fluid around the third MPJ left and plantarly was found to have a keratotic lesion is very painful when pressed to make walking difficult.  Patient has good range of motion and digital traction     Assessment:  Inflammatory capsulitis third MPJ left and also noted to have lesion formation with pain left     Plan:  H&P reviewed condition at this time did sterile prep and injected the capsule 3 mg Dexasone Kenalog 5 mg Xylocaine.  I then debrided the lesion no iatrogenic bleeding and this will be done routinely

## 2020-01-20 NOTE — Progress Notes (Signed)
   Subjective:    Patient ID: Tommy Dyer, male    DOB: 1936/08/25, 84 y.o.   MRN: [id number]  HPI    Review of Systems  All other systems reviewed and are negative.      Objective:   Physical Exam        Assessment & Plan:

## 2020-01-24 DIAGNOSIS — N183 Chronic kidney disease, stage 3 unspecified: Secondary | ICD-10-CM | POA: Diagnosis not present

## 2020-01-24 DIAGNOSIS — N189 Chronic kidney disease, unspecified: Secondary | ICD-10-CM | POA: Diagnosis not present

## 2020-01-24 DIAGNOSIS — E7800 Pure hypercholesterolemia, unspecified: Secondary | ICD-10-CM | POA: Diagnosis not present

## 2020-01-24 DIAGNOSIS — C61 Malignant neoplasm of prostate: Secondary | ICD-10-CM | POA: Diagnosis not present

## 2020-01-24 DIAGNOSIS — I10 Essential (primary) hypertension: Secondary | ICD-10-CM | POA: Diagnosis not present

## 2020-01-28 DIAGNOSIS — C61 Malignant neoplasm of prostate: Secondary | ICD-10-CM | POA: Diagnosis not present

## 2020-02-04 DIAGNOSIS — R350 Frequency of micturition: Secondary | ICD-10-CM | POA: Diagnosis not present

## 2020-02-04 DIAGNOSIS — C61 Malignant neoplasm of prostate: Secondary | ICD-10-CM | POA: Diagnosis not present

## 2020-02-04 DIAGNOSIS — N200 Calculus of kidney: Secondary | ICD-10-CM | POA: Diagnosis not present

## 2020-02-12 DIAGNOSIS — I10 Essential (primary) hypertension: Secondary | ICD-10-CM | POA: Diagnosis not present

## 2020-02-17 ENCOUNTER — Telehealth: Payer: Self-pay | Admitting: *Deleted

## 2020-02-17 NOTE — Telephone Encounter (Signed)
I spoke with pt and he states the area is sore, and the skin has not come off. I asked pt if the area was red, swollen or had open wound. Pt denies the mentioned symptoms. I instructed pt soak once daily in 14 cup epsom salt and 1 quart of warm water for 15 minutes, the cover the area with a lightly coated neosporin dressing and off load with a non-medicated bunion pad daily and call Thursday with status or sooner if needed.

## 2020-02-17 NOTE — Telephone Encounter (Signed)
Pt states Dr. Paulla Dolly trimmed a place that hurts and he has before but this time it hurts.

## 2020-02-21 ENCOUNTER — Telehealth: Payer: Self-pay | Admitting: Podiatry

## 2020-02-21 DIAGNOSIS — I10 Essential (primary) hypertension: Secondary | ICD-10-CM | POA: Diagnosis not present

## 2020-02-21 NOTE — Telephone Encounter (Signed)
Called patient and lvm to tell patient that our triage nurse tell i recommend an appt if he is not better and to continue with the orders i gave him previously until seen .

## 2020-02-25 ENCOUNTER — Telehealth: Payer: Self-pay | Admitting: *Deleted

## 2020-02-25 NOTE — Telephone Encounter (Signed)
Patient is calling and says that his Callus has not gotten any better within last 2 days and was recently told by nurse that he should schedule appointment to come in.

## 2020-03-04 ENCOUNTER — Ambulatory Visit: Payer: Medicare HMO | Admitting: Podiatry

## 2020-03-04 ENCOUNTER — Encounter: Payer: Self-pay | Admitting: Podiatry

## 2020-03-04 ENCOUNTER — Other Ambulatory Visit: Payer: Self-pay

## 2020-03-04 VITALS — Temp 98.0°F

## 2020-03-04 DIAGNOSIS — M779 Enthesopathy, unspecified: Secondary | ICD-10-CM

## 2020-03-04 DIAGNOSIS — Q828 Other specified congenital malformations of skin: Secondary | ICD-10-CM | POA: Diagnosis not present

## 2020-03-05 NOTE — Progress Notes (Signed)
Subjective:   Patient ID: Tommy Dyer, male   DOB: 84 y.o.   MRN: [id number]   HPI Patient presents stating that the left foot the injection did not help but the lesion seems to have improved with my previous treatment.  States that it is still sore chief he feels like he is walking on the bone and still has fluid inflammation but is better than previous   ROS      Objective:  Physical Exam  Neurovascular status unchanged with patient found to have a lesion that is mostly around the third metatarsal left.  It is moderately painful when pressed and there is an actual lesion formation but it has improved from previous visit     Assessment:  Inflammatory capsulitis with plantarflexed metatarsal with chronic lesion formation is secondary factor     Plan:  H&P reviewed the structural condition and possibility for elevating osteotomy if symptoms were to persist.  At this time I did do sterile debridement of the lesion with sharp instrumentation took the remainder out and patient states it feels much better.  Patient will be seen back as needed will wear thick bottom shoes and not go barefoot and will be seen back for further treatment as indicated

## 2020-03-09 ENCOUNTER — Ambulatory Visit: Payer: Medicare HMO | Admitting: Podiatry

## 2020-03-13 ENCOUNTER — Encounter (HOSPITAL_COMMUNITY)
Admission: EM | Disposition: A | Discharge status: Home / Self Care | Payer: Self-pay | Source: Home / Self Care | Attending: Cardiology

## 2020-03-13 ENCOUNTER — Encounter (HOSPITAL_COMMUNITY): Payer: Self-pay

## 2020-03-13 ENCOUNTER — Emergency Department (HOSPITAL_COMMUNITY): Payer: Medicare HMO

## 2020-03-13 ENCOUNTER — Other Ambulatory Visit: Payer: Self-pay

## 2020-03-13 ENCOUNTER — Inpatient Hospital Stay (HOSPITAL_COMMUNITY): Payer: Medicare HMO

## 2020-03-13 ENCOUNTER — Inpatient Hospital Stay (HOSPITAL_COMMUNITY)
Admission: EM | Admit: 2020-03-13 | Discharge: 2020-03-15 | DRG: 247 | Disposition: A | Discharge status: Home / Self Care | Payer: Medicare HMO | Attending: Cardiology | Admitting: Cardiology

## 2020-03-13 DIAGNOSIS — I10 Essential (primary) hypertension: Secondary | ICD-10-CM | POA: Diagnosis not present

## 2020-03-13 DIAGNOSIS — I2510 Atherosclerotic heart disease of native coronary artery without angina pectoris: Secondary | ICD-10-CM | POA: Diagnosis present

## 2020-03-13 DIAGNOSIS — Z20822 Contact with and (suspected) exposure to covid-19: Secondary | ICD-10-CM | POA: Diagnosis present

## 2020-03-13 DIAGNOSIS — N184 Chronic kidney disease, stage 4 (severe): Secondary | ICD-10-CM | POA: Diagnosis present

## 2020-03-13 DIAGNOSIS — Z833 Family history of diabetes mellitus: Secondary | ICD-10-CM

## 2020-03-13 DIAGNOSIS — Z87442 Personal history of urinary calculi: Secondary | ICD-10-CM | POA: Diagnosis not present

## 2020-03-13 DIAGNOSIS — R0902 Hypoxemia: Secondary | ICD-10-CM | POA: Diagnosis not present

## 2020-03-13 DIAGNOSIS — Z79899 Other long term (current) drug therapy: Secondary | ICD-10-CM

## 2020-03-13 DIAGNOSIS — C61 Malignant neoplasm of prostate: Secondary | ICD-10-CM | POA: Diagnosis present

## 2020-03-13 DIAGNOSIS — Z803 Family history of malignant neoplasm of breast: Secondary | ICD-10-CM

## 2020-03-13 DIAGNOSIS — M810 Age-related osteoporosis without current pathological fracture: Secondary | ICD-10-CM | POA: Diagnosis present

## 2020-03-13 DIAGNOSIS — K219 Gastro-esophageal reflux disease without esophagitis: Secondary | ICD-10-CM | POA: Diagnosis not present

## 2020-03-13 DIAGNOSIS — J90 Pleural effusion, not elsewhere classified: Secondary | ICD-10-CM | POA: Diagnosis not present

## 2020-03-13 DIAGNOSIS — E785 Hyperlipidemia, unspecified: Secondary | ICD-10-CM | POA: Diagnosis present

## 2020-03-13 DIAGNOSIS — R079 Chest pain, unspecified: Secondary | ICD-10-CM | POA: Diagnosis not present

## 2020-03-13 DIAGNOSIS — I2102 ST elevation (STEMI) myocardial infarction involving left anterior descending coronary artery: Secondary | ICD-10-CM

## 2020-03-13 DIAGNOSIS — Z87891 Personal history of nicotine dependence: Secondary | ICD-10-CM | POA: Diagnosis not present

## 2020-03-13 DIAGNOSIS — Z955 Presence of coronary angioplasty implant and graft: Secondary | ICD-10-CM

## 2020-03-13 DIAGNOSIS — R001 Bradycardia, unspecified: Secondary | ICD-10-CM | POA: Diagnosis not present

## 2020-03-13 DIAGNOSIS — N171 Acute kidney failure with acute cortical necrosis: Secondary | ICD-10-CM | POA: Diagnosis not present

## 2020-03-13 DIAGNOSIS — M1990 Unspecified osteoarthritis, unspecified site: Secondary | ICD-10-CM | POA: Diagnosis present

## 2020-03-13 DIAGNOSIS — E782 Mixed hyperlipidemia: Secondary | ICD-10-CM | POA: Diagnosis not present

## 2020-03-13 DIAGNOSIS — R0789 Other chest pain: Secondary | ICD-10-CM | POA: Diagnosis not present

## 2020-03-13 DIAGNOSIS — I2121 ST elevation (STEMI) myocardial infarction involving left circumflex coronary artery: Principal | ICD-10-CM | POA: Diagnosis present

## 2020-03-13 DIAGNOSIS — E7800 Pure hypercholesterolemia, unspecified: Secondary | ICD-10-CM | POA: Diagnosis not present

## 2020-03-13 DIAGNOSIS — I129 Hypertensive chronic kidney disease with stage 1 through stage 4 chronic kidney disease, or unspecified chronic kidney disease: Secondary | ICD-10-CM | POA: Diagnosis not present

## 2020-03-13 HISTORY — DX: Atherosclerotic heart disease of native coronary artery without angina pectoris: I25.10

## 2020-03-13 HISTORY — PX: CORONARY/GRAFT ACUTE MI REVASCULARIZATION: CATH118305

## 2020-03-13 HISTORY — PX: LEFT HEART CATH AND CORONARY ANGIOGRAPHY: CATH118249

## 2020-03-13 LAB — CBC WITH DIFFERENTIAL/PLATELET
Abs Immature Granulocytes: 0.03 10*3/uL (ref 0.00–0.07)
Basophils Absolute: 0.1 10*3/uL (ref 0.0–0.1)
Basophils Relative: 1 %
Eosinophils Absolute: 0.1 10*3/uL (ref 0.0–0.5)
Eosinophils Relative: 1 %
HCT: 38.6 % — ABNORMAL LOW (ref 39.0–52.0)
Hemoglobin: 13.4 g/dL (ref 13.0–17.0)
Immature Granulocytes: 0 %
Lymphocytes Relative: 30 %
Lymphs Abs: 2.3 10*3/uL (ref 0.7–4.0)
MCH: 29.1 pg (ref 26.0–34.0)
MCHC: 34.7 g/dL (ref 30.0–36.0)
MCV: 83.7 fL (ref 80.0–100.0)
Monocytes Absolute: 0.5 10*3/uL (ref 0.1–1.0)
Monocytes Relative: 6 %
Neutro Abs: 4.7 10*3/uL (ref 1.7–7.7)
Neutrophils Relative %: 62 %
Platelets: 182 10*3/uL (ref 150–400)
RBC: 4.61 MIL/uL (ref 4.22–5.81)
RDW: 14.2 % (ref 11.5–15.5)
WBC: 7.6 10*3/uL (ref 4.0–10.5)
nRBC: 0 % (ref 0.0–0.2)

## 2020-03-13 LAB — COMPREHENSIVE METABOLIC PANEL
ALT: 22 U/L (ref 0–44)
AST: 30 U/L (ref 15–41)
Albumin: 3.8 g/dL (ref 3.5–5.0)
Alkaline Phosphatase: 29 U/L — ABNORMAL LOW (ref 38–126)
Anion gap: 9 (ref 5–15)
BUN: 23 mg/dL (ref 8–23)
CO2: 22 mmol/L (ref 22–32)
Calcium: 9.6 mg/dL (ref 8.9–10.3)
Chloride: 107 mmol/L (ref 98–111)
Creatinine, Ser: 2.55 mg/dL — ABNORMAL HIGH (ref 0.61–1.24)
GFR calc Af Amer: 26 mL/min — ABNORMAL LOW (ref >60)
GFR calc non Af Amer: 23 mL/min — ABNORMAL LOW (ref >60)
Glucose, Bld: 133 mg/dL — ABNORMAL HIGH (ref 70–99)
Potassium: 3.7 mmol/L (ref 3.5–5.1)
Sodium: 138 mmol/L (ref 135–145)
Total Bilirubin: 0.4 mg/dL (ref 0.3–1.2)
Total Protein: 7.6 g/dL (ref 6.5–8.1)

## 2020-03-13 LAB — HEMOGLOBIN A1C
Hgb A1c MFr Bld: 5.9 % — ABNORMAL HIGH (ref 4.8–5.6)
Hgb A1c MFr Bld: 5.9 % — ABNORMAL HIGH (ref 4.8–5.6)
Mean Plasma Glucose: 122.63 mg/dL
Mean Plasma Glucose: 122.63 mg/dL

## 2020-03-13 LAB — POCT I-STAT, CHEM 8
BUN: 23 mg/dL (ref 8–23)
Calcium, Ion: 1.3 mmol/L (ref 1.15–1.40)
Chloride: 108 mmol/L (ref 98–111)
Creatinine, Ser: 2.5 mg/dL — ABNORMAL HIGH (ref 0.61–1.24)
Glucose, Bld: 120 mg/dL — ABNORMAL HIGH (ref 70–99)
HCT: 37 % — ABNORMAL LOW (ref 39.0–52.0)
Hemoglobin: 12.6 g/dL — ABNORMAL LOW (ref 13.0–17.0)
Potassium: 3.6 mmol/L (ref 3.5–5.1)
Sodium: 141 mmol/L (ref 135–145)
TCO2: 20 mmol/L — ABNORMAL LOW (ref 22–32)

## 2020-03-13 LAB — CREATININE, SERUM
Creatinine, Ser: 2.48 mg/dL — ABNORMAL HIGH (ref 0.61–1.24)
GFR calc Af Amer: 27 mL/min — ABNORMAL LOW (ref >60)
GFR calc non Af Amer: 23 mL/min — ABNORMAL LOW (ref >60)

## 2020-03-13 LAB — LIPID PANEL
Cholesterol: 198 mg/dL (ref 0–200)
HDL: 29 mg/dL — ABNORMAL LOW (ref >40)
LDL Cholesterol: 149 mg/dL — ABNORMAL HIGH (ref 0–99)
Total CHOL/HDL Ratio: 6.8 RATIO
Triglycerides: 102 mg/dL (ref <150)
VLDL: 20 mg/dL (ref 0–40)

## 2020-03-13 LAB — POCT ACTIVATED CLOTTING TIME: Activated Clotting Time: 395 seconds

## 2020-03-13 LAB — PROTIME-INR
INR: 1.3 — ABNORMAL HIGH (ref 0.8–1.2)
Prothrombin Time: 15.3 seconds — ABNORMAL HIGH (ref 11.4–15.2)

## 2020-03-13 LAB — SARS CORONAVIRUS 2 BY RT PCR (HOSPITAL ORDER, PERFORMED IN ~~LOC~~ HOSPITAL LAB): SARS Coronavirus 2: NEGATIVE

## 2020-03-13 LAB — TROPONIN I (HIGH SENSITIVITY)
Troponin I (High Sensitivity): 14 ng/L (ref <18)
Troponin I (High Sensitivity): >27000 ng/L (ref <18)
Troponin I (High Sensitivity): >27000 ng/L (ref <18)

## 2020-03-13 LAB — MRSA PCR SCREENING: MRSA by PCR: NEGATIVE

## 2020-03-13 LAB — APTT: aPTT: 104 seconds — ABNORMAL HIGH (ref 24–36)

## 2020-03-13 SURGERY — CORONARY/GRAFT ACUTE MI REVASCULARIZATION
Anesthesia: LOCAL

## 2020-03-13 MED ORDER — MIDAZOLAM HCL 2 MG/2ML IJ SOLN
INTRAMUSCULAR | Status: AC
  Filled 2020-03-13: qty 2

## 2020-03-13 MED ORDER — SODIUM CHLORIDE 0.9% FLUSH
3.0000 mL | Freq: Two times a day (BID) | INTRAVENOUS | Status: DC
  Administered 2020-03-14 – 2020-03-15 (×4): 3 mL via INTRAVENOUS

## 2020-03-13 MED ORDER — SODIUM CHLORIDE 0.9 % IV SOLN: INTRAVENOUS | Status: DC

## 2020-03-13 MED ORDER — NITROGLYCERIN 1 MG/10 ML FOR IR/CATH LAB
INTRA_ARTERIAL | Status: AC
  Filled 2020-03-13: qty 10

## 2020-03-13 MED ORDER — ASPIRIN 81 MG PO CHEW
324.0000 mg | CHEWABLE_TABLET | Freq: Once | ORAL | Status: DC
  Filled 2020-03-13: qty 4

## 2020-03-13 MED ORDER — HEPARIN (PORCINE) IN NACL 1000-0.9 UT/500ML-% IV SOLN
INTRAVENOUS | Status: DC | PRN
  Administered 2020-03-13 (×2): 500 mL

## 2020-03-13 MED ORDER — TICAGRELOR 90 MG PO TABS
ORAL_TABLET | ORAL | Status: DC | PRN
  Administered 2020-03-13: 180 mg via ORAL

## 2020-03-13 MED ORDER — ENOXAPARIN SODIUM 30 MG/0.3ML ~~LOC~~ SOLN
30.0000 mg | SUBCUTANEOUS | Status: DC
  Administered 2020-03-14 – 2020-03-15 (×2): 30 mg via SUBCUTANEOUS
  Filled 2020-03-13 (×2): qty 0.3

## 2020-03-13 MED ORDER — ASPIRIN 81 MG PO CHEW: 81.0000 mg | CHEWABLE_TABLET | Freq: Every day | ORAL | Status: DC

## 2020-03-13 MED ORDER — HEPARIN SODIUM (PORCINE) 1000 UNIT/ML IJ SOLN
INTRAMUSCULAR | Status: AC
  Filled 2020-03-13: qty 1

## 2020-03-13 MED ORDER — HYDRALAZINE HCL 20 MG/ML IJ SOLN: 10.0000 mg | INTRAMUSCULAR | Status: AC | PRN

## 2020-03-13 MED ORDER — IOHEXOL 350 MG/ML SOLN
INTRAVENOUS | Status: DC | PRN
  Administered 2020-03-13: 155 mL via INTRA_ARTERIAL

## 2020-03-13 MED ORDER — TAMSULOSIN HCL 0.4 MG PO CAPS
0.4000 mg | ORAL_CAPSULE | Freq: Every day | ORAL | Status: DC
  Administered 2020-03-13 – 2020-03-14 (×2): 0.4 mg via ORAL
  Filled 2020-03-13 (×2): qty 1

## 2020-03-13 MED ORDER — FENOFIBRATE 54 MG PO TABS
54.0000 mg | ORAL_TABLET | Freq: Every day | ORAL | Status: DC
  Administered 2020-03-14 – 2020-03-15 (×2): 54 mg via ORAL
  Filled 2020-03-13 (×2): qty 1

## 2020-03-13 MED ORDER — MIDAZOLAM HCL 2 MG/2ML IJ SOLN
INTRAMUSCULAR | Status: DC | PRN
  Administered 2020-03-13: 1 mg via INTRAVENOUS

## 2020-03-13 MED ORDER — FLUTICASONE PROPIONATE 50 MCG/ACT NA SUSP
1.0000 | Freq: Every day | NASAL | Status: DC
  Administered 2020-03-13 – 2020-03-15 (×3): 1 via NASAL
  Filled 2020-03-13: qty 16

## 2020-03-13 MED ORDER — VERAPAMIL HCL 2.5 MG/ML IV SOLN
INTRAVENOUS | Status: DC | PRN
  Administered 2020-03-13: 10 mL via INTRA_ARTERIAL

## 2020-03-13 MED ORDER — HEPARIN SODIUM (PORCINE) 5000 UNIT/ML IJ SOLN: 60.0000 [IU]/kg | Freq: Once | INTRAMUSCULAR | Status: DC

## 2020-03-13 MED ORDER — TICAGRELOR 90 MG PO TABS
ORAL_TABLET | ORAL | Status: AC
  Filled 2020-03-13: qty 2

## 2020-03-13 MED ORDER — SODIUM CHLORIDE 0.9 % IV SOLN: 250.0000 mL | INTRAVENOUS | Status: DC | PRN

## 2020-03-13 MED ORDER — NITROGLYCERIN 0.4 MG SL SUBL: 0.4000 mg | SUBLINGUAL_TABLET | SUBLINGUAL | Status: DC | PRN

## 2020-03-13 MED ORDER — ATORVASTATIN CALCIUM 80 MG PO TABS
80.0000 mg | ORAL_TABLET | Freq: Every day | ORAL | Status: DC
  Administered 2020-03-14 – 2020-03-15 (×2): 80 mg via ORAL
  Filled 2020-03-13 (×3): qty 1

## 2020-03-13 MED ORDER — AMLODIPINE BESYLATE 10 MG PO TABS
10.0000 mg | ORAL_TABLET | Freq: Every day | ORAL | Status: DC
  Administered 2020-03-14 – 2020-03-15 (×2): 10 mg via ORAL
  Filled 2020-03-13 (×2): qty 1

## 2020-03-13 MED ORDER — IOHEXOL 350 MG/ML SOLN
INTRAVENOUS | Status: AC
  Filled 2020-03-13: qty 1

## 2020-03-13 MED ORDER — LIDOCAINE HCL (PF) 1 % IJ SOLN
INTRAMUSCULAR | Status: DC | PRN
  Administered 2020-03-13: 2 mL via INTRADERMAL

## 2020-03-13 MED ORDER — HEPARIN SODIUM (PORCINE) 1000 UNIT/ML IJ SOLN
INTRAMUSCULAR | Status: DC | PRN
  Administered 2020-03-13: 6000 [IU] via INTRAVENOUS

## 2020-03-13 MED ORDER — HEPARIN (PORCINE) IN NACL 1000-0.9 UT/500ML-% IV SOLN
INTRAVENOUS | Status: AC
  Filled 2020-03-13: qty 1000

## 2020-03-13 MED ORDER — LIDOCAINE HCL (PF) 1 % IJ SOLN
INTRAMUSCULAR | Status: AC
  Filled 2020-03-13: qty 30

## 2020-03-13 MED ORDER — HEPARIN BOLUS VIA INFUSION
4000.0000 [IU] | Freq: Once | INTRAVENOUS | Status: AC
  Administered 2020-03-13: 4000 [IU] via INTRAVENOUS
  Filled 2020-03-13: qty 4000

## 2020-03-13 MED ORDER — TICAGRELOR 90 MG PO TABS
90.0000 mg | ORAL_TABLET | Freq: Two times a day (BID) | ORAL | Status: DC
  Administered 2020-03-13 – 2020-03-15 (×4): 90 mg via ORAL
  Filled 2020-03-13 (×4): qty 1

## 2020-03-13 MED ORDER — ONDANSETRON HCL 4 MG/2ML IJ SOLN: 4.0000 mg | Freq: Four times a day (QID) | INTRAMUSCULAR | Status: DC | PRN

## 2020-03-13 MED ORDER — PNEUMOCOCCAL VAC POLYVALENT 25 MCG/0.5ML IJ INJ: 0.5000 mL | INJECTION | INTRAMUSCULAR | Status: DC

## 2020-03-13 MED ORDER — ACETAMINOPHEN 325 MG PO TABS
650.0000 mg | ORAL_TABLET | ORAL | Status: DC | PRN
  Administered 2020-03-14: 650 mg via ORAL
  Filled 2020-03-13: qty 2

## 2020-03-13 MED ORDER — METOPROLOL SUCCINATE ER 25 MG PO TB24
25.0000 mg | ORAL_TABLET | Freq: Every day | ORAL | Status: DC
  Administered 2020-03-13 – 2020-03-14 (×2): 25 mg via ORAL
  Filled 2020-03-13 (×2): qty 1

## 2020-03-13 MED ORDER — ASPIRIN EC 81 MG PO TBEC
81.0000 mg | DELAYED_RELEASE_TABLET | Freq: Every day | ORAL | Status: DC
  Administered 2020-03-14 – 2020-03-15 (×2): 81 mg via ORAL
  Filled 2020-03-13 (×2): qty 1

## 2020-03-13 MED ORDER — SODIUM CHLORIDE 0.9 % WEIGHT BASED INFUSION
1.0000 mL/kg/h | INTRAVENOUS | Status: AC
  Administered 2020-03-13: 1 mL/kg/h via INTRAVENOUS

## 2020-03-13 MED ORDER — LABETALOL HCL 5 MG/ML IV SOLN
10.0000 mg | INTRAVENOUS | Status: AC | PRN
  Administered 2020-03-13 (×2): 10 mg via INTRAVENOUS
  Filled 2020-03-13: qty 4

## 2020-03-13 MED ORDER — CHLORHEXIDINE GLUCONATE CLOTH 2 % EX PADS
6.0000 | MEDICATED_PAD | Freq: Every day | CUTANEOUS | Status: DC
  Administered 2020-03-13 – 2020-03-14 (×2): 6 via TOPICAL

## 2020-03-13 MED ORDER — SODIUM CHLORIDE 0.9% FLUSH
3.0000 mL | INTRAVENOUS | Status: DC | PRN
  Administered 2020-03-15 (×2): 3 mL via INTRAVENOUS

## 2020-03-13 MED ORDER — ENOXAPARIN SODIUM 40 MG/0.4ML ~~LOC~~ SOLN: 40.0000 mg | SUBCUTANEOUS | Status: DC

## 2020-03-13 MED ORDER — HEPARIN SODIUM (PORCINE) 5000 UNIT/ML IJ SOLN: 4000.0000 [IU] | Freq: Once | INTRAMUSCULAR | Status: DC

## 2020-03-13 MED ORDER — VERAPAMIL HCL 2.5 MG/ML IV SOLN
INTRAVENOUS | Status: AC
  Filled 2020-03-13: qty 2

## 2020-03-13 SURGICAL SUPPLY — 18 items
BALLN EMERGE MR 2.5X15 (BALLOONS) ×2
BALLN SAPPHIRE ~~LOC~~ 3.0X15 (BALLOONS) ×1 IMPLANT
BALLOON EMERGE MR 2.5X15 (BALLOONS) IMPLANT
CATH INFINITI 5FR ANG PIGTAIL (CATHETERS) ×1 IMPLANT
CATH INFINITI JR4 5F (CATHETERS) ×1 IMPLANT
CATH VISTA GUIDE 6FR XB3.5 (CATHETERS) ×1 IMPLANT
CATH VISTA GUIDE 6FR XBLAD3.5 (CATHETERS) ×1 IMPLANT
DEVICE RAD COMP TR BAND LRG (VASCULAR PRODUCTS) ×1 IMPLANT
GLIDESHEATH SLEND SS 6F .021 (SHEATH) ×1 IMPLANT
GUIDEWIRE INQWIRE 1.5J.035X260 (WIRE) IMPLANT
INQWIRE 1.5J .035X260CM (WIRE) ×2
KIT ENCORE 26 ADVANTAGE (KITS) ×1 IMPLANT
KIT HEART LEFT (KITS) ×2 IMPLANT
PACK CARDIAC CATHETERIZATION (CUSTOM PROCEDURE TRAY) ×2 IMPLANT
STENT RESOLUTE ONYX 2.75X22 (Permanent Stent) ×1 IMPLANT
TRANSDUCER W/STOPCOCK (MISCELLANEOUS) ×2 IMPLANT
TUBING CIL FLEX 10 FLL-RA (TUBING) ×2 IMPLANT
WIRE ASAHI PROWATER 180CM (WIRE) ×2 IMPLANT

## 2020-03-13 NOTE — Plan of Care (Signed)

## 2020-03-13 NOTE — H&P (Signed)
Cardiology Admission History and Physical:   Patient ID: Tommy Dyer MRN: [id number]; DOB: [birth year]   Admission date: 03/13/2020  Primary Care Provider: [name], MD [place] Cardiologist: No primary care provider on file. [facility] Electrophysiologist:  None   Chief Complaint:  Chest pain  Patient Profile:   Tommy Dyer is a 84 y.o. male with history of HTN, HLD presents with chest pain. Ecg shows lateral ST elevation c/w STEMI  History of Present Illness:   Tommy Dyer has a history of HTN, HLD, prostate CA, CKD, and kidney stones. He reports that his arms have been hurting off and on for the past month. Yesterday he developed substernal chest pain around noon radiating to his arms. This pain was off and on throughout the day. This morning at 7 am he reports the pain became worse and did not resolve. EMS called. Initial Ecg done by EMS was not diagnostic for STEMI. Subsequent Ecg done in the ED showed ST elevation in the lateral leads. Code STEMI activated.    Past Medical History:  Diagnosis Date   Arthritis    Gastric ulcer    GERD (gastroesophageal reflux disease)    History of kidney stones    Hypercholesterolemia    Hypertension    Kidney stones    Osteoporosis    Prostate cancer [facility])     Past Surgical History:  Procedure Laterality Date   colonscopy     EXTRACORPOREAL SHOCK WAVE LITHOTRIPSY Right 06/21/2018   Procedure: RIGHT EXTRACORPOREAL SHOCK WAVE LITHOTRIPSY (ESWL);  Surgeon: Ardis Hughs, MD;  Location: [facility];  Service: Urology;  Laterality: Right;   PROSTATE BIOPSY     PROSTATE BIOPSY       Medications Prior to Admission: Prior to Admission medications   Medication Sig Start Date End Date Taking? Authorizing Provider  acetaminophen (TYLENOL) 325 MG tablet Take 650 mg by mouth every 6 (six) hours as needed.    [provider]  amLODipine (NORVASC) 10 MG tablet Take 10 mg by mouth daily.    [provider]  Calcium Carbonate-Vitamin D (CALCIUM + D PO) Take by mouth.    [provider]  Carboxymethylcellulose Sodium (REFRESH TEARS OP) Apply to eye.    [provider]  famotidine (PEPCID) 10 MG tablet Take 10 mg by mouth as needed for heartburn or indigestion.    [provider]  fenofibrate 54 MG tablet Take 54 mg by mouth daily.    [provider]  fluticasone (FLONASE) 50 MCG/ACT nasal spray Place into both nostrils daily.    [provider]  Lidocaine-Glycerin (PREPARATION H EX) Apply topically as needed.     [provider]  mometasone (NASONEX) 50 MCG/ACT nasal spray Place 2 sprays into the nose daily.    [provider]  tamsulosin (FLOMAX) 0.4 MG CAPS capsule TAKE 1 CAPSULE EVERY DAY  AFTER  SUPPER 06/17/15   Tyler Pita, MD     Allergies:   No Known Allergies  Social History:   Social History   Socioeconomic History   Marital status: Married    Spouse name: Not on file   Number of children: Not on file   Years of education: Not on file   Highest education level: Not on file  Occupational History   Not on file  Tobacco Use   Smoking status: Former Smoker    Packs/day: 1.00    Years: 20.00    Pack years: 20.00    Quit  date: 09/27/2003    Years since quitting: 16.4   Smokeless tobacco: Never Used  Vaping Use   Vaping Use: Never used  Substance and Sexual Activity   Alcohol use: No   Drug use: No   Sexual activity: Not on file  Other Topics Concern   Not on file  Social History Narrative   Not on file   Social Determinants of Health   Financial Resource Strain:    Difficulty of Paying Living Expenses:   Food Insecurity:    Worried About Charity fundraiser in the Last Year:    Arboriculturist in the Last Year:   Transportation Needs:    Film/video editor (Medical):    Lack of Transportation (Non-Medical):   Physical Activity:    Days of Exercise per Week:     Minutes of Exercise per Session:   Stress:    Feeling of Stress :   Social Connections:    Frequency of Communication with Friends and Family:    Frequency of Social Gatherings with Friends and Family:    Attends Religious Services:    Active Member of Clubs or Organizations:    Attends Music therapist:    Marital Status:   Intimate Partner Violence:    Fear of Current or Ex-Partner:    Emotionally Abused:    Physically Abused:    Sexually Abused:     Family History:   The patient's family history includes Breast cancer in his maternal grandmother; Cancer in his mother; Diabetes in his father and mother; High blood pressure in his father and mother. There is no history of Colon cancer or Stomach cancer.    ROS:  Please see the history of present illness.  All other ROS reviewed and negative.     Physical Exam/Data:   Vitals:   03/13/20 1304 03/13/20 1308 03/13/20 1313 03/13/20 1318  BP: 138/72 136/81 132/81 130/74  Pulse: 78 74 76 79  Resp: 16 16 15 15   Temp:      TempSrc:      SpO2: 100% 100% 100% 100%  Weight:      Height:       No intake or output data in the 24 hours ending 03/13/20 1340 Last 3 Weights 03/13/2020 02/22/2019 02/13/2019  Weight (lbs) 205 lb 210 lb 210 lb  Weight (kg) 92.987 kg 95.255 kg 95.255 kg     Body mass index is 27.8 kg/m.  General:  Well nourished, well developed, in no acute distress HEENT: normal Lymph: no adenopathy Neck: no JVD Endocrine:  No thryomegaly Vascular: No carotid bruits; FA pulses 2+ bilaterally without bruits  Cardiac:  normal S1, S2; RRR; no murmur  Lungs:  clear to auscultation bilaterally, no wheezing, rhonchi or rales  Abd: soft, nontender, no hepatomegaly  Ext: no edema Musculoskeletal:  No deformities, BUE and BLE strength normal and equal Skin: warm and dry  Neuro:  CNs 2-12 intact, no focal abnormalities noted Psych:  Normal affect    EKG:  The ECG that was done today  was personally  reviewed and demonstrates NSR with ST elevation in 2, Avf, and V5-6 c/w lateral infarct.   Relevant CV Studies: none  Laboratory Data:  High Sensitivity Troponin:   Recent Labs  Lab 03/13/20 1213  TROPONINIHS 14      Chemistry Recent Labs  Lab 03/13/20 1213  NA 138  K 3.7  CL 107  CO2 22  GLUCOSE 133*  BUN 23  CREATININE 2.55*  CALCIUM 9.6  GFRNONAA 23*  GFRAA 26*  ANIONGAP 9    Recent Labs  Lab 03/13/20 1213  PROT 7.6  ALBUMIN 3.8  AST 30  ALT 22  ALKPHOS 29*  BILITOT 0.4   Hematology Recent Labs  Lab 03/13/20 1213  WBC 7.6  RBC 4.61  HGB 13.4  HCT 38.6*  MCV 83.7  MCH 29.1  MCHC 34.7  RDW 14.2  PLT 182   BNPNo results for input(s): BNP, PROBNP in the last 168 hours.  DDimer No results for input(s): DDIMER in the last 168 hours.   Radiology/Studies:  CARDIAC CATHETERIZATION  Result Date: 03/13/2020  Prox LAD lesion is 40% stenosed.  Mid LAD lesion is 80% stenosed.  2nd Mrg lesion is 100% stenosed.  Post intervention, there is a 0% residual stenosis.  A drug-eluting stent was successfully placed using a STENT RESOLUTE ONYX [id number].  LV end diastolic pressure is mildly elevated.  1. 2 vessel obstructive CAD    -80% mid LAD    -100% second OM 2. Elevated LVEDP 23 mm Hg 3. Successful PCI of the second OM with DES x 1 Plan: DAPT for one year. Hydrate. Monitor renal function closely. Depending on recovery of renal function consider staged PCI of the mid LAD. Will assess LV function by Echo.       TIMI Risk Score for ST  Elevation MI:   The patient's TIMI risk score is  , which indicates a  % risk of all cause mortality at 30 days.       Assessment and Plan:   1. Acute lateral STEMI. Patient with stuttering onset of pain that intensified this am. Given ASA and IV heparin in the ED. Will proceed with emergent cardiac cath with PCI.  2. HTN on amlodipine. Will add Toprol XL 3. Hyperlipidemia. On fenofibrate. Will check lipid panel and start high  dose statin 4. CKD stage 4. Renal function appears worse than prior baseline. Will hydrate and monitor. 5. History of renal calculi 6. History of prostate CA treated with RT  Severity of Illness: The appropriate patient status for this patient is INPATIENT. Inpatient status is judged to be reasonable and necessary in order to provide the required intensity of service to ensure the patient's safety. The patient's presenting symptoms, physical exam findings, and initial radiographic and laboratory data in the context of their chronic comorbidities is felt to place them at high risk for further clinical deterioration. Furthermore, it is not anticipated that the patient will be medically stable for discharge from the hospital within 2 midnights of admission. The following factors support the patient status of inpatient.   " The patient's presenting symptoms include chest pain. " The worrisome physical exam findings include none. " The initial radiographic and laboratory data are worrisome because of ST elevation on Ecg. " The chronic co-morbidities include HTN, HLD, CKD. .   * I certify that at the point of admission it is my clinical judgment that the patient will require inpatient hospital care spanning beyond 2 midnights from the point of admission due to high intensity of service, high risk for further deterioration and high frequency of surveillance required.*    For questions or updates, please contact [place] Please consult [email or web address] for contact info under     Signed, Naraly Fritcher [place], MD  03/13/2020 1:40 PM

## 2020-03-13 NOTE — ED Provider Notes (Signed)
[place] EMERGENCY DEPARTMENT Provider Note   CSN: [id number] Arrival date & time: 03/13/20  1127     History Chief Complaint  Patient presents with  . Chest Pain    Tommy Dyer is a 84 y.o. male with pertinent past medical history of hypercholesteremia, hypertension that presents emergency department today for chest pain.  Patient states that chest pain started last night, has been progressively getting worse.  States that it is central, keeps describing it as a tiredness.  According to triage note states that it was a 9 out of 10 while earlier today, EMS was called and gave him aspirin along with nitro, states that pain is now a 5/10.  Patient states that pain was radiating down to his left arm, denies any pain anymore.  Denies any previous weakness or paresthesias in the left arm.  Denies any back pain.  Patient states that he was in normal health yesterday.  Denies any fever, URI symptoms, nausea, vomiting, diaphoresis, abdominal pain, weakness, headache, vision changes.  Denies any cardiac history.  Has never had MI before.  HPI     Past Medical History:  Diagnosis Date  . Arthritis   . Gastric ulcer   . GERD (gastroesophageal reflux disease)   . History of kidney stones   . Hypercholesterolemia   . Hypertension   . Kidney stones   . Osteoporosis   . Prostate cancer [facility])     Patient Active Problem List   Diagnosis Date Noted  . Adenocarcinoma of prostate (Tommy Dyer) 10/15/2013  . NEURAL HEARING LOSS BILATERAL 09/30/2008  . HYPERTENSION 09/30/2008  . ALLERGIC RHINITIS 09/30/2008  . GERD 09/30/2008  . OSTEOARTHRITIS 09/30/2008  . OSTEOPOROSIS 09/30/2008  . COLONIC POLYPS, HX OF 09/30/2008  . NEPHROLITHIASIS, HX OF 09/30/2008    Past Surgical History:  Procedure Laterality Date  . colonscopy    . EXTRACORPOREAL SHOCK WAVE LITHOTRIPSY Right 06/21/2018   Procedure: RIGHT EXTRACORPOREAL SHOCK WAVE LITHOTRIPSY (ESWL);  Surgeon: Ardis Hughs, MD;   Location: [facility];  Service: Urology;  Laterality: Right;  . PROSTATE BIOPSY    . PROSTATE BIOPSY         Family History  Problem Relation Age of Onset  . Cancer Mother        unknown  . Diabetes Mother   . High blood pressure Mother   . Diabetes Father   . High blood pressure Father   . Breast cancer Maternal Grandmother        breast  . Colon cancer Neg Hx   . Stomach cancer Neg Hx     Social History   Tobacco Use  . Smoking status: Former Smoker    Packs/day: 1.00    Years: 20.00    Pack years: 20.00    Quit date: 09/27/2003    Years since quitting: 16.4  . Smokeless tobacco: Never Used  Vaping Use  . Vaping Use: Never used  Substance Use Topics  . Alcohol use: No  . Drug use: No    Home Medications Prior to Admission medications   Medication Sig Start Date End Date Taking? Authorizing Provider  acetaminophen (TYLENOL) 325 MG tablet Take 650 mg by mouth every 6 (six) hours as needed.    [provider]  amLODipine (NORVASC) 10 MG tablet Take 10 mg by mouth daily.    [provider]  Calcium Carbonate-Vitamin D (CALCIUM + D PO) Take by mouth.    [provider]  Carboxymethylcellulose Sodium (REFRESH  TEARS OP) Apply to eye.    [provider]  famotidine (PEPCID) 10 MG tablet Take 10 mg by mouth as needed for heartburn or indigestion.    [provider]  fenofibrate 54 MG tablet Take 54 mg by mouth daily.    [provider]  fluticasone (FLONASE) 50 MCG/ACT nasal spray Place into both nostrils daily.    [provider]  Lidocaine-Glycerin (PREPARATION H EX) Apply topically as needed.     [provider]  mometasone (NASONEX) 50 MCG/ACT nasal spray Place 2 sprays into the nose daily.    [provider]  tamsulosin (FLOMAX) 0.4 MG CAPS capsule TAKE 1 CAPSULE EVERY DAY  AFTER  SUPPER 06/17/15   Tyler Pita, MD    Allergies    Patient has no known allergies.  Review of Systems     Review of Systems  Constitutional: Negative for chills, diaphoresis, fatigue and fever.  HENT: Negative for congestion, sore throat and trouble swallowing.   Eyes: Negative for pain and visual disturbance.  Respiratory: Negative for cough, shortness of breath and wheezing.   Cardiovascular: Positive for chest pain. Negative for palpitations and leg swelling.  Gastrointestinal: Negative for abdominal distention, abdominal pain, diarrhea, nausea and vomiting.  Genitourinary: Negative for difficulty urinating.  Musculoskeletal: Negative for back pain, neck pain and neck stiffness.  Skin: Negative for pallor.  Neurological: Negative for dizziness, speech difficulty, weakness and headaches.  Psychiatric/Behavioral: Negative for confusion.    Physical Exam Updated Vital Signs BP 131/73 (BP Location: Right Arm)   Pulse 76   Temp 97.6 F (36.4 C) (Oral)   Resp 14   Ht 6' (1.829 m)   Wt 93 kg   SpO2 100%   BMI 27.80 kg/m   Physical Exam Constitutional:      General: He is not in acute distress.    Appearance: Normal appearance. He is not ill-appearing, toxic-appearing or diaphoretic.     Comments: Patient is not in any acute distress, nondiaphoretic.  Patient seems comfortable in bed.  Satting at 97% on room air  HENT:     Mouth/Throat:     Mouth: Mucous membranes are moist.     Pharynx: Oropharynx is clear.  Eyes:     General: No scleral icterus.    Extraocular Movements: Extraocular movements intact.     Pupils: Pupils are equal, round, and reactive to light.  Cardiovascular:     Rate and Rhythm: Normal rate and regular rhythm.     Pulses: Normal pulses.     Heart sounds: Normal heart sounds.  Pulmonary:     Effort: Pulmonary effort is normal. No respiratory distress.     Breath sounds: Normal breath sounds. No stridor. No wheezing, rhonchi or rales.  Chest:     Chest wall: No tenderness.  Abdominal:     General: Abdomen is flat. There is no distension.     Palpations:  Abdomen is soft.     Tenderness: There is no abdominal tenderness. There is no guarding or rebound.  Musculoskeletal:        General: No swelling or tenderness. Normal range of motion.     Cervical back: Normal range of motion and neck supple. No rigidity.     Right lower leg: No edema.     Left lower leg: No edema.  Skin:    General: Skin is warm and dry.     Capillary Refill: Capillary refill takes less than 2 seconds.     Coloration:  Skin is not pale.  Neurological:     General: No focal deficit present.     Mental Status: He is alert and oriented to person, place, and time.     Cranial Nerves: No cranial nerve deficit.     Sensory: No sensory deficit.     Motor: No weakness.     Coordination: Coordination normal.     Gait: Gait normal.     Deep Tendon Reflexes: Reflexes normal.  Psychiatric:        Mood and Affect: Mood normal.        Behavior: Behavior normal.     ED Results / Procedures / Treatments   Labs (all labs ordered are listed, but only abnormal results are displayed) Labs Reviewed  CBC WITH DIFFERENTIAL/PLATELET - Abnormal; Notable for the following components:      Result Value   HCT 38.6 (*)    All other components within normal limits  SARS CORONAVIRUS 2 BY RT PCR (HOSPITAL ORDER, [facility])  HEMOGLOBIN A1C  COMPREHENSIVE METABOLIC PANEL  LIPID PANEL  PROTIME-INR  APTT  TROPONIN I (HIGH SENSITIVITY)    EKG EKG Interpretation  Date/Time:  Friday March 13 2020 11:32:27 EDT Ventricular Rate:  75 PR Interval:    QRS Duration: 93 QT Interval:  385 QTC Calculation: 430 R Axis:   24 Text Interpretation: Sinus rhythm Lateral infarct, acute (LAD) ST elevation, consider inferior injury >>> Acute MI <<< Confirmed by Dorie Rank [phone]) on 03/13/2020 11:55:18 AM   Radiology No results found.  Procedures Procedures (including critical care time)  Medications Ordered in ED Medications  0.9 %  sodium chloride infusion (  Intravenous Bolus from Bag 03/13/20 1221)  lidocaine (PF) (XYLOCAINE) 1 % injection (2 mLs Intradermal Given 03/13/20 1239)  heparin bolus via infusion 4,000 Units (4,000 Units Intravenous Bolus from Bag 03/13/20 1212)    ED Course  I have reviewed the triage vital signs and the nursing notes.  Pertinent labs & imaging results that were available during my care of the patient were reviewed by me and considered in my medical decision making (see chart for details).  Clinical Course as of Mar 14 1247  Fri Mar 13, 2020  1213 EMS and ED EKG were reviewed.  No paper copy was provided initially.  EKG (reviewed on MUSE) concerning for code STEMI.  Activated immediately after my review of the EKG   [JK]    Clinical Course User Index [JK] Dorie Rank, MD   MDM Rules/Calculators/A&P                         Tommy Dyer is a 84 y.o. male with pertinent past medical history of hypercholesteremia, hypertension that presents emergency department today for chest pain.EKG with ST elevations in V5 and V6 with reciprocal changes.  Code STEMI was activated.  Dr. Tomi Bamberger was informed.  Patient with normal vitals, satting at 97% on room air.  Patient seems comfortable in bed, do not think morphine is needed at this time.  Patient states that pain is almost gone.  Patient does not have any cardiac history.  Aspirin and nitro were given via EMS.  Heparin bolus given in the emergency department today.   Dr. Tomi Bamberger spoke to cardiology will assume care of patient.  Final Clinical Impression(s) / ED Diagnoses Final diagnoses:  ST elevation myocardial infarction involving left anterior descending (LAD) coronary artery ([place])    Rx / DC Orders  ED Discharge Orders    None       Alfredia Client, PA-C 03/13/20 1249    Dorie Rank, MD 03/14/20 [phone]

## 2020-03-13 NOTE — Plan of Care (Signed)
  Problem: Cardiovascular: Goal: Vascular access site(s) Level 0-1 will be maintained Outcome: Progressing   Problem: Cardiovascular: Goal: Ability to achieve and maintain adequate cardiovascular perfusion will improve Outcome: Progressing   Problem: Education: Goal: Understanding of CV disease, CV risk reduction, and recovery process will improve Outcome: Progressing

## 2020-03-13 NOTE — Progress Notes (Signed)
TR band taken off patient @2250 . Total of 9 cc of air taken out on this shift. Site clean, dry and intact and at level 0. Tegaderm dressing placed and patient educated about site and dressing. Will continue to monitor

## 2020-03-13 NOTE — ED Provider Notes (Signed)
Medical screening examination/treatment/procedure(s) were conducted as a shared visit with non-physician practitioner(s) and myself.  I personally evaluated the patient during the encounter.  Patient presents to ED for evaluation of chest pain.  Patient described chest pain starting yesterday.  Today it was more intense and radiating to his jaw and elbow.  No known history of heart disease.  Physical Exam  BP 131/73 (BP Location: Right Arm)   Pulse 76   Temp 97.6 F (36.4 C) (Oral)   Resp 14   Ht 1.829 m (6')   Wt 93 kg   SpO2 97%   BMI 27.80 kg/m   Physical Exam Vitals and nursing note reviewed.  Constitutional:      General: He is not in acute distress.    Appearance: He is well-developed.  HENT:     Head: Normocephalic and atraumatic.     Right Ear: External ear normal.     Left Ear: External ear normal.  Eyes:     General: No scleral icterus.       Right eye: No discharge.        Left eye: No discharge.     Conjunctiva/sclera: Conjunctivae normal.  Neck:     Trachea: No tracheal deviation.  Cardiovascular:     Rate and Rhythm: Normal rate.  Pulmonary:     Effort: Pulmonary effort is normal. No respiratory distress.     Breath sounds: No stridor.  Abdominal:     General: There is no distension.  Musculoskeletal:        General: No swelling or deformity.     Cervical back: Neck supple.  Skin:    General: Skin is warm and dry.     Findings: No rash.  Neurological:     Mental Status: He is alert.     Cranial Nerves: Cranial nerve deficit: no gross deficits.     ED Course/Procedures   Clinical Course as of Mar 13 1216  Fri Mar 13, 2020  1213 EMS and ED EKG were reviewed.  No paper copy was provided initially.  EKG (reviewed on MUSE) concerning for code STEMI.  Activated immediately after my review of the EKG   [JK]    Clinical Course User Index [JK] Dorie Rank, MD    EKG Interpretation  Date/Time:  Friday March 13 2020 11:32:27 EDT Ventricular Rate:   75 PR Interval:    QRS Duration: 93 QT Interval:  385 QTC Calculation: 430 R Axis:   24 Text Interpretation: Sinus rhythm Lateral infarct, acute (LAD) ST elevation, consider inferior injury >>> Acute MI <<< Confirmed by Dorie Rank [phone]) on 03/13/2020 11:55:18 AM       Procedures  MDM  Patient presented via EMS with complaints of chest pain initial EKG from EMS did not meet STEMI criteria.  Patient's bedside EKG however is concerning for acute ST elevation MI.  I immediately activated code STEMI after I reviewed the patient's EKG.  Case discussed with Dr. Martinique.  Patient will proceed to the catheterization lab.      Dorie Rank, MD 03/13/20 1217

## 2020-03-13 NOTE — Progress Notes (Signed)
Called to 2h for radial band oozing. Tr band in place on right radial. Band looks deflated, very slight ooze not appearing to be from the artery. Tr band is at deflation time except for ooze. Appears the band may have leaked slowly over the past 2 hrs. 3cc air added, no oozing noted. Recommended to wait another 30 minutes before deflating, and to elevate the right arm higher.

## 2020-03-13 NOTE — ED Triage Notes (Signed)
Pt was waiting at a Dr office for a family member getting a procedure. The central/pressure CP he began having yesterday increased to an intensity of 9/10 while in the waiting room, with radiations into his Left arm/neck. EMS arrived & gave pt 324 mg ASA & 2 Nitro, pt stated that it did help the pain & rated it a 5/10 upon arrival to ED.

## 2020-03-14 ENCOUNTER — Inpatient Hospital Stay (HOSPITAL_COMMUNITY): Payer: Medicare HMO

## 2020-03-14 DIAGNOSIS — N171 Acute kidney failure with acute cortical necrosis: Secondary | ICD-10-CM

## 2020-03-14 DIAGNOSIS — I2510 Atherosclerotic heart disease of native coronary artery without angina pectoris: Secondary | ICD-10-CM

## 2020-03-14 DIAGNOSIS — E782 Mixed hyperlipidemia: Secondary | ICD-10-CM

## 2020-03-14 LAB — ECHOCARDIOGRAM COMPLETE
Height: 72 in
Weight: 3280 oz

## 2020-03-14 LAB — CBC
HCT: 38.2 % — ABNORMAL LOW (ref 39.0–52.0)
Hemoglobin: 13.8 g/dL (ref 13.0–17.0)
MCH: 29.4 pg (ref 26.0–34.0)
MCHC: 36.1 g/dL — ABNORMAL HIGH (ref 30.0–36.0)
MCV: 81.4 fL (ref 80.0–100.0)
Platelets: 188 10*3/uL (ref 150–400)
RBC: 4.69 MIL/uL (ref 4.22–5.81)
RDW: 13.9 % (ref 11.5–15.5)
WBC: 7.3 10*3/uL (ref 4.0–10.5)
nRBC: 0 % (ref 0.0–0.2)

## 2020-03-14 LAB — BASIC METABOLIC PANEL
Anion gap: 8 (ref 5–15)
BUN: 19 mg/dL (ref 8–23)
CO2: 20 mmol/L — ABNORMAL LOW (ref 22–32)
Calcium: 9.4 mg/dL (ref 8.9–10.3)
Chloride: 108 mmol/L (ref 98–111)
Creatinine, Ser: 2.26 mg/dL — ABNORMAL HIGH (ref 0.61–1.24)
GFR calc Af Amer: 30 mL/min — ABNORMAL LOW (ref >60)
GFR calc non Af Amer: 26 mL/min — ABNORMAL LOW (ref >60)
Glucose, Bld: 104 mg/dL — ABNORMAL HIGH (ref 70–99)
Potassium: 3.8 mmol/L (ref 3.5–5.1)
Sodium: 136 mmol/L (ref 135–145)

## 2020-03-14 LAB — LIPID PANEL
Cholesterol: 190 mg/dL (ref 0–200)
HDL: 26 mg/dL — ABNORMAL LOW (ref >40)
LDL Cholesterol: 140 mg/dL — ABNORMAL HIGH (ref 0–99)
Total CHOL/HDL Ratio: 7.3 RATIO
Triglycerides: 120 mg/dL (ref <150)
VLDL: 24 mg/dL (ref 0–40)

## 2020-03-14 MED ORDER — METOPROLOL SUCCINATE ER 50 MG PO TB24
50.0000 mg | ORAL_TABLET | Freq: Every day | ORAL | Status: DC
  Administered 2020-03-15: 50 mg via ORAL
  Filled 2020-03-14: qty 1

## 2020-03-14 NOTE — Progress Notes (Signed)
Progress Note  Patient Name: Tommy Dyer Date of Encounter: 03/14/2020  [facility] Cardiologist: New patient/ Dr Martinique  Subjective   The patient is chest pain free this am.  Inpatient Medications    Scheduled Meds: . amLODipine  10 mg Oral Daily  . aspirin EC  81 mg Oral Daily  . atorvastatin  80 mg Oral Daily  . Chlorhexidine Gluconate Cloth  6 each Topical Daily  . enoxaparin (LOVENOX) injection  30 mg Subcutaneous Q24H  . fenofibrate  54 mg Oral Daily  . fluticasone  1 spray Each Nare Daily  . metoprolol succinate  25 mg Oral Daily  . pneumococcal 23 valent vaccine  0.5 mL Intramuscular Tomorrow-1000  . sodium chloride flush  3 mL Intravenous Q12H  . tamsulosin  0.4 mg Oral QPC supper  . ticagrelor  90 mg Oral BID   Continuous Infusions: . sodium chloride 10 mL/hr at 03/13/20 1221  . sodium chloride     PRN Meds: sodium chloride, acetaminophen, nitroGLYCERIN, ondansetron (ZOFRAN) IV, sodium chloride flush   Vital Signs    Vitals:   03/14/20 0700 03/14/20 0829 03/14/20 0900 03/14/20 0938  BP: 140/75  126/72 125/72  Pulse: 82  78 81  Resp: 14  15   Temp:  97.6 F (36.4 C)    TempSrc:      SpO2: 97%  95%   Weight:      Height:        Intake/Output Summary (Last 24 hours) at 03/14/2020 1007 Last data filed at 03/14/2020 0945 Gross per 24 hour  Intake 452.25 ml  Output 3025 ml  Net -2572.75 ml   Last 3 Weights 03/13/2020 02/22/2019 02/13/2019  Weight (lbs) 205 lb 210 lb 210 lb  Weight (kg) 92.987 kg 95.255 kg 95.255 kg     Telemetry    SR - Personally Reviewed  ECG    SR, nonspecific ST T wave abnormalities - Personally Reviewed  Physical Exam   GEN: No acute distress.   Neck: No JVD Cardiac: RRR, no murmurs, rubs, or gallops.  Respiratory: Clear to auscultation bilaterally. GI: Soft, nontender, non-distended  MS: No edema; No deformity. Neuro:  Nonfocal  Psych: Normal affect   Labs    High Sensitivity Troponin:   Recent Labs  Lab  03/13/20 1213 03/13/20 1548 03/13/20 1944  TROPONINIHS 14 >27,000* >27,000*      Chemistry Recent Labs  Lab 03/13/20 1213 03/13/20 1213 03/13/20 1241 03/13/20 1548 03/14/20 0414  NA 138  --  141  --  136  K 3.7  --  3.6  --  3.8  CL 107  --  108  --  108  CO2 22  --   --   --  20*  GLUCOSE 133*  --  120*  --  104*  BUN 23  --  23  --  19  CREATININE 2.55*   < > 2.50* 2.48* 2.26*  CALCIUM 9.6  --   --   --  9.4  PROT 7.6  --   --   --   --   ALBUMIN 3.8  --   --   --   --   AST 30  --   --   --   --   ALT 22  --   --   --   --   ALKPHOS 29*  --   --   --   --   BILITOT 0.4  --   --   --   --  GFRNONAA 23*  --   --  23* 26*  GFRAA 26*  --   --  27* 30*  ANIONGAP 9  --   --   --  8   < > = values in this interval not displayed.     Hematology Recent Labs  Lab 03/13/20 1213 03/13/20 1241 03/14/20 0414  WBC 7.6  --  7.3  RBC 4.61  --  4.69  HGB 13.4 12.6* 13.8  HCT 38.6* 37.0* 38.2*  MCV 83.7  --  81.4  MCH 29.1  --  29.4  MCHC 34.7  --  36.1*  RDW 14.2  --  13.9  PLT 182  --  188    BNPNo results for input(s): BNP, PROBNP in the last 168 hours.   DDimer No results for input(s): DDIMER in the last 168 hours.   Radiology    CARDIAC CATHETERIZATION  Result Date: 03/13/2020  Prox LAD lesion is 40% stenosed.  Mid LAD lesion is 80% stenosed.  2nd Mrg lesion is 100% stenosed.  Post intervention, there is a 0% residual stenosis.  A drug-eluting stent was successfully placed using a STENT RESOLUTE ONYX [id number].  LV end diastolic pressure is mildly elevated.  1. 2 vessel obstructive CAD    -80% mid LAD    -100% second OM 2. Elevated LVEDP 23 mm Hg 3. Successful PCI of the second OM with DES x 1 Plan: DAPT for one year. Hydrate. Monitor renal function closely. Depending on recovery of renal function consider staged PCI of the mid LAD. Will assess LV function by Echo.   DG Chest Port 1 View  Result Date: 03/13/2020 CLINICAL DATA:  Chest pain EXAM: PORTABLE CHEST  1 VIEW COMPARISON:  06/27/2011 FINDINGS: Cardiac shadow is stable. The lungs are well aerated bilaterally. No focal infiltrate or sizable effusion is seen. No bony abnormality is noted. IMPRESSION: No acute abnormality noted. Electronically Signed   By: Inez Catalina M.D.   On: 03/13/2020 16:47   Cardiac Studies   Cardiac Cath:   Prox LAD lesion is 40% stenosed.  Mid LAD lesion is 80% stenosed.  2nd Mrg lesion is 100% stenosed.  Post intervention, there is a 0% residual stenosis.  A drug-eluting stent was successfully placed using a STENT RESOLUTE ONYX [id number].  LV end diastolic pressure is mildly elevated.   1. 2 vessel obstructive CAD    -80% mid LAD    -100% second OM 2. Elevated LVEDP 23 mm Hg 3. Successful PCI of the second OM with DES x 1  Plan: DAPT for one year. Hydrate. Monitor renal function closely. Depending on recovery of renal function consider staged PCI of the mid LAD. Will assess LV function by Echo.    Patient Profile     84 y.o. male with history of HTN, HLD presents with chest pain. Ecg shows lateral ST elevation c/w STEMI  Assessment & Plan    1. Acute lateral STEMI. 2 vessel obstructive CAD, 80% mid LAD, 100% second OM Elevated LVEDP 23 mm Hg, Successful PCI of the second OM with DES x 1 - Troponin > 27.000 -  DAPT for one year. Hydrate. Monitor renal function closely. Depending on recovery of renal function consider staged PCI of the mid LAD. Crea 2.48->2.26 LVEF 55-60%, mild basal inferior and inferolateral hypokinesis.  2. HTN on amlodipine and Toprol XL, I will increase to 50 mg po daily 3. Hyperlipidemia. On fenofibrate, add atorvastatin 80 mg po daily 4. CKD stage 4. Renal  function appears worse than prior baseline. Will hydrate and monitor, History of renal calculi and prostate CA treated with RT  For questions or updates, please contact [place] Please consult [email or web address] for contact info under     Signed, Ena Dawley, MD    03/14/2020, 10:07 AM

## 2020-03-14 NOTE — Progress Notes (Signed)
  Echocardiogram 2D Echocardiogram has been performed.  Tommy Dyer 03/14/2020, 9:14 AM

## 2020-03-14 NOTE — Progress Notes (Signed)
Phase 1 cardiac rehab 1420-1500 Reviewed heart healthy diet, MI booklet with patient, wife and son. Given stent card. Discussed exercise instructions, RPE scale. End point's of exercise, temp precautions RPE scale reviewed. Tommy Dyer says he does not think he will be interested in phase 2 cardiac rehab as he goes to the [facility]. Referral placed in case he changes his mind for [facility].Barnet Pall, RN,BSN 03/14/2020 3:12 PM

## 2020-03-14 NOTE — Plan of Care (Signed)

## 2020-03-15 ENCOUNTER — Encounter (HOSPITAL_COMMUNITY): Payer: Self-pay | Admitting: Cardiology

## 2020-03-15 LAB — BASIC METABOLIC PANEL
Anion gap: 10 (ref 5–15)
BUN: 18 mg/dL (ref 8–23)
CO2: 20 mmol/L — ABNORMAL LOW (ref 22–32)
Calcium: 9.3 mg/dL (ref 8.9–10.3)
Chloride: 106 mmol/L (ref 98–111)
Creatinine, Ser: 2.49 mg/dL — ABNORMAL HIGH (ref 0.61–1.24)
GFR calc Af Amer: 27 mL/min — ABNORMAL LOW (ref >60)
GFR calc non Af Amer: 23 mL/min — ABNORMAL LOW (ref >60)
Glucose, Bld: 100 mg/dL — ABNORMAL HIGH (ref 70–99)
Potassium: 4 mmol/L (ref 3.5–5.1)
Sodium: 136 mmol/L (ref 135–145)

## 2020-03-15 MED ORDER — ATORVASTATIN CALCIUM 80 MG PO TABS: 80.0000 mg | ORAL_TABLET | Freq: Every day | ORAL | 0 refills | Status: DC

## 2020-03-15 MED ORDER — TICAGRELOR 90 MG PO TABS: 90.0000 mg | ORAL_TABLET | Freq: Two times a day (BID) | ORAL | 3 refills | Status: DC

## 2020-03-15 MED ORDER — TICAGRELOR 90 MG PO TABS: 90.0000 mg | ORAL_TABLET | Freq: Two times a day (BID) | ORAL | 0 refills | Status: DC

## 2020-03-15 MED ORDER — METOPROLOL SUCCINATE ER 50 MG PO TB24: 50.0000 mg | ORAL_TABLET | Freq: Every day | ORAL | 1 refills | Status: AC

## 2020-03-15 MED ORDER — ASPIRIN 81 MG PO TBEC: 81.0000 mg | DELAYED_RELEASE_TABLET | Freq: Every day | ORAL | 11 refills | Status: DC

## 2020-03-15 MED ORDER — METOPROLOL SUCCINATE ER 50 MG PO TB24: 50.0000 mg | ORAL_TABLET | Freq: Every day | ORAL | 0 refills | Status: DC

## 2020-03-15 MED ORDER — ATORVASTATIN CALCIUM 80 MG PO TABS: 80.0000 mg | ORAL_TABLET | Freq: Every day | ORAL | 1 refills | Status: DC

## 2020-03-15 MED ORDER — NITROGLYCERIN 0.4 MG SL SUBL: 0.4000 mg | SUBLINGUAL_TABLET | SUBLINGUAL | 2 refills | Status: DC | PRN

## 2020-03-15 NOTE — Progress Notes (Signed)
Progress Note  Patient Name: Tommy Dyer Date of Encounter: 03/15/2020  [facility] Cardiologist: New patient/ Dr Martinique  Subjective   The patient is chest pain free this am. He walked without difficulties.  Inpatient Medications    Scheduled Meds: . amLODipine  10 mg Oral Daily  . aspirin EC  81 mg Oral Daily  . atorvastatin  80 mg Oral Daily  . Chlorhexidine Gluconate Cloth  6 each Topical Daily  . enoxaparin (LOVENOX) injection  30 mg Subcutaneous Q24H  . fenofibrate  54 mg Oral Daily  . fluticasone  1 spray Each Nare Daily  . metoprolol succinate  50 mg Oral Daily  . pneumococcal 23 valent vaccine  0.5 mL Intramuscular Tomorrow-1000  . sodium chloride flush  3 mL Intravenous Q12H  . tamsulosin  0.4 mg Oral QPC supper  . ticagrelor  90 mg Oral BID   Continuous Infusions: . sodium chloride 10 mL/hr at 03/13/20 1221  . sodium chloride     PRN Meds: sodium chloride, acetaminophen, nitroGLYCERIN, ondansetron (ZOFRAN) IV, sodium chloride flush   Vital Signs    Vitals:   03/15/20 0900 03/15/20 1000 03/15/20 1008 03/15/20 1010  BP: 131/84 111/72 111/72 111/72  Pulse: 89 89  92  Resp: 17 17    Temp:      TempSrc:      SpO2: 95% 97%    Weight:      Height:        Intake/Output Summary (Last 24 hours) at 03/15/2020 1128 Last data filed at 03/15/2020 1019 Gross per 24 hour  Intake 729 ml  Output 1050 ml  Net -321 ml   Last 3 Weights 03/13/2020 02/22/2019 02/13/2019  Weight (lbs) 205 lb 210 lb 210 lb  Weight (kg) 92.987 kg 95.255 kg 95.255 kg     Telemetry    SR 60-70' - Personally Reviewed  ECG    SR, nonspecific ST T wave abnormalities - Personally Reviewed  Physical Exam   GEN: No acute distress.   Neck: No JVD Cardiac: RRR, no murmurs, rubs, or gallops.  Respiratory: Clear to auscultation bilaterally. GI: Soft, nontender, non-distended  MS: No edema; No deformity. Neuro:  Nonfocal  Psych: Normal affect   Labs    High Sensitivity Troponin:     Recent Labs  Lab 03/13/20 1213 03/13/20 1548 03/13/20 1944  TROPONINIHS 14 >27,000* >27,000*     Chemistry Recent Labs  Lab 03/13/20 1213 03/13/20 1213 03/13/20 1241 03/13/20 1548 03/14/20 0414 03/15/20 0336  NA 138   < > 141  --  136 136  K 3.7   < > 3.6  --  3.8 4.0  CL 107   < > 108  --  108 106  CO2 22  --   --   --  20* 20*  GLUCOSE 133*   < > 120*  --  104* 100*  BUN 23   < > 23  --  19 18  CREATININE 2.55*   < > 2.50* 2.48* 2.26* 2.49*  CALCIUM 9.6  --   --   --  9.4 9.3  PROT 7.6  --   --   --   --   --   ALBUMIN 3.8  --   --   --   --   --   AST 30  --   --   --   --   --   ALT 22  --   --   --   --   --  ALKPHOS 29*  --   --   --   --   --   BILITOT 0.4  --   --   --   --   --   GFRNONAA 23*   < >  --  23* 26* 23*  GFRAA 26*   < >  --  27* 30* 27*  ANIONGAP 9  --   --   --  8 10   < > = values in this interval not displayed.    Hematology Recent Labs  Lab 03/13/20 1213 03/13/20 1241 03/14/20 0414  WBC 7.6  --  7.3  RBC 4.61  --  4.69  HGB 13.4 12.6* 13.8  HCT 38.6* 37.0* 38.2*  MCV 83.7  --  81.4  MCH 29.1  --  29.4  MCHC 34.7  --  36.1*  RDW 14.2  --  13.9  PLT 182  --  188    BNPNo results for input(s): BNP, PROBNP in the last 168 hours.   DDimer No results for input(s): DDIMER in the last 168 hours.   Radiology    TTE: 03/14/2020  Study Result    ECHOCARDIOGRAM REPORT       Patient Name:  Tommy Dyer Date of Exam: 03/14/2020  Medical Rec #: [id number]  Height:    72.0 in  Accession #:  [id number]  Weight:    205.0 lb  Date of Birth: Mar 07, 1936  BSA:     2.153 m  Patient Age:  84 years   BP:      140/75 mmHg  Patient Gender: M      HR:      80 bpm.  Exam Location: Inpatient   Procedure: 2D Echo, Cardiac Doppler and Color Doppler   Indications:  CAD Native Vessel 414.01 / I25.10    History:    Patient has no prior history of Echocardiogram  examinations.          Arrythmias:STEMI; Risk Factors:Hypertension, Dyslipidemia  and         Former Smoker. GERD.    Sonographer:  Vickie Epley RDCS  Referring Phys: [id number] PETER M Martinique   IMPRESSIONS    1. Left ventricular ejection fraction, by estimation, is 55 to 60%. The  left ventricle has normal function. The left ventricle has no regional  wall motion abnormalities. Left ventricular diastolic parameters are  consistent with Grade I diastolic  dysfunction (impaired relaxation). Elevated left atrial pressure.  2. Right ventricular systolic function is normal. The right ventricular  size is normal.  3. The mitral valve is normal in structure. No evidence of mitral valve  regurgitation. No evidence of mitral stenosis.  4. The aortic valve is normal in structure. Aortic valve regurgitation is  not visualized. Mild to moderate aortic valve sclerosis/calcification is  present, without any evidence of aortic stenosis.  5. The inferior vena cava is normal in size with greater than 50%  respiratory variability, suggesting right atrial pressure of 3 mmHg.     Cardiac Studies   Cardiac Cath:   Prox LAD lesion is 40% stenosed.  Mid LAD lesion is 80% stenosed.  2nd Mrg lesion is 100% stenosed.  Post intervention, there is a 0% residual stenosis.  A drug-eluting stent was successfully placed using a STENT RESOLUTE ONYX [id number].  LV end diastolic pressure is mildly elevated.   1. 2 vessel obstructive CAD    -80% mid LAD    -100% second OM 2. Elevated LVEDP 23 mm Hg 3.  Successful PCI of the second OM with DES x 1  Plan: DAPT for one year. Hydrate. Monitor renal function closely. Depending on recovery of renal function consider staged PCI of the mid LAD. Will assess LV function by Echo.   Patient Profile     84 y.o. male with history of HTN, HLD presents with chest pain. Ecg shows lateral ST elevation c/w STEMI  Assessment & Plan    1. Acute lateral STEMI. 2 vessel  obstructive CAD, 80% mid LAD, 100% second OM Elevated LVEDP 23 mm Hg, Successful PCI of the second OM with DES x 1 - Troponin > 27.000 -  DAPT for one year. Hydrate. Monitor renal function closely. Depending on recovery of renal function consider staged PCI of the mid LAD. Crea 2.48->2.26->2.49 LVEF 55-60%, mild basal inferior and inferolateral hypokinesis. -- he is asymptomatic, he will require an early follow up later this week with labs, if Crea improved schedule a staged PCI to mid LAD - he is advised to call us if he has recurrent symptoms - discharge today  2. HTN on amlodipine and Toprol XL, I will increase to 50 mg po daily, improved HR, now in 60-70' 3. Hyperlipidemia. On fenofibrate, add atorvastatin 80 mg po daily 4. CKD stage 4. Renal function appears worse than prior baseline. Will hydrate and monitor, History of renal calculi and prostate CA treated with RT  For questions or updates, please contact [place] Please consult [email or web address] for contact info under     Signed, Ena Dawley, MD  03/15/2020, 11:28 AM

## 2020-03-15 NOTE — Discharge Summary (Signed)
Discharge Summary    Patient ID: Tommy Dyer MRN: [id number]; DOB: [birth year]  Admit date: 03/13/2020 Discharge date: 03/15/2020  Primary Care Provider: [name], MD  Primary Cardiologist: Peter Martinique, MD (new this admission)  Discharge Diagnoses    Principal Problem:   STEMI involving left circumflex coronary artery [facility]) Active Problems:   Essential hypertension   NEPHROLITHIASIS, HX OF   Adenocarcinoma of prostate ([place])   Hyperlipidemia   CKD (chronic kidney disease) stage 4, GFR 15-29 ml/min (HCC)   ST elevation myocardial infarction involving left circumflex coronary artery [facility])    Diagnostic Studies/Procedures    Cardiac Catheterization: 03/13/2020  Prox LAD lesion is 40% stenosed.  Mid LAD lesion is 80% stenosed.  2nd Mrg lesion is 100% stenosed.  Post intervention, there is a 0% residual stenosis.  A drug-eluting stent was successfully placed using a STENT RESOLUTE ONYX [id number].  LV end diastolic pressure is mildly elevated.   1. 2 vessel obstructive CAD    -80% mid LAD    -100% second OM 2. Elevated LVEDP 23 mm Hg 3. Successful PCI of the second OM with DES x 1  Plan: DAPT for one year. Hydrate. Monitor renal function closely. Depending on recovery of renal function consider staged PCI of the mid LAD. Will assess LV function by Echo.   Echocardiogram: 03/14/2020 IMPRESSIONS    1. Left ventricular ejection fraction, by estimation, is 55 to 60%. The  left ventricle has normal function. The left ventricle has no regional  wall motion abnormalities. Left ventricular diastolic parameters are  consistent with Grade I diastolic  dysfunction (impaired relaxation). Elevated left atrial pressure.  2. Right ventricular systolic function is normal. The right ventricular  size is normal.  3. The mitral valve is normal in structure. No evidence of mitral valve  regurgitation. No evidence of mitral stenosis.  4. The aortic valve is normal in  structure. Aortic valve regurgitation is  not visualized. Mild to moderate aortic valve sclerosis/calcification is  present, without any evidence of aortic stenosis.  5. The inferior vena cava is normal in size with greater than 50%  respiratory variability, suggesting right atrial pressure of 3 mmHg.     History of Present Illness     Tommy Dyer is a 84 y.o. male with past medical history of HTN, HLD, nephrolithiasis, Stage 4 CKD and history of prostate cancer who presented to [facility] ED 03/13/2020 for evaluation of chest pain.   He reported worsening discomfort along his arms for the past month and the day prior to admission he developed intermittent episodes of substernal chest pain which would radiate to his arms. The morning of admission, he developed worsening pain which did not resolve and prompted him to come to the ED for further evaluation.   Initial EMS showed ST elevation along the lateral leads and CODE STEMI was activated and he was taken to the cath lab for an emergent cardiac catheterization.   Hospital Course     Consultants: None  His catheterization showed 40% Prox-LAD stenosis, 80% mid-LAD stenosis and 100% 2nd Mrg stenosis and he underwent DES placement to the 2nd Mrg with 0% residual stenosis. LVEDP was elevated to 23 mm Hg. It was mentioned that depending on recovery of renal function, could consider staged PCI of the mid LAD. Was started on DAPT with ASA and Brilinta.   The following morning, he denied any recurrent chest pain. Creatinine had been elevated at 2.55 on admission and was stable at 2.26 following  his procedure. HS Troponin values peaked > 27,000. Echocardiogram showed a preserved EF of 55 to 60% with Grade 1 DD and mild to moderate aortic valve sclerosis/calcification without any evidence of aortic stenosis. Hgb A1c was mildly elevated to 5.9 and Lipid Panel showed LDL was elevated to 149. He was started on Atorvastatin 80mg  daily.   On 03/15/2020, he  continued to deny any recurrent symptoms and had ambulated with cardiac rehab without difficulty. Creatinine was at 2.49 which was similar to his values on admission. He was examined by Dr. Meda Coffee and deemed stable for discharge. Was recommended to consider staged PCI of the LAD as an outpatient if renal function remained stable. Toprol-XL was increased to 50mg  daily and he was continued on Amlodipine. 30-day prescriptions were sent to [facility] and 90-day prescriptions were sent to [facility] at his request. A 30-day card was provided for Brilinta. A staff message has been sent to the office to arrange for a TOC visit within 7-10 days.    Did the patient have an acute coronary syndrome (MI, NSTEMI, STEMI, etc) this admission?:  Yes                               AHA/ACC Clinical Performance & Quality Measures: 1. Aspirin prescribed? - Yes 2. ADP Receptor Inhibitor (Plavix/Clopidogrel, Brilinta/Ticagrelor or Effient/Prasugrel) prescribed (includes medically managed patients)? - Yes 3. Beta Blocker prescribed? - Yes 4. High Intensity Statin (Lipitor 40-80mg  or Crestor 20-40mg ) prescribed? - Yes 5. EF assessed during THIS hospitalization? - Yes 6. For EF <40%, was ACEI/ARB prescribed? - Not Applicable (EF >/= 51%) 7. For EF <40%, Aldosterone Antagonist (Spironolactone or Eplerenone) prescribed? - Not Applicable (EF >/= 88%) 8. Cardiac Rehab Phase II ordered (including medically managed patients)? - Yes   _____________  Discharge Vitals Blood pressure 125/80, pulse 80, temperature 98.3 F (36.8 C), resp. rate 14, height 6' (1.829 m), weight 93 kg, SpO2 98 %.  Filed Weights   03/13/20 1200  Weight: 93 kg    Labs & Radiologic Studies    CBC Recent Labs    03/13/20 1213 03/13/20 1213 03/13/20 1241 03/14/20 0414  WBC 7.6  --   --  7.3  NEUTROABS 4.7  --   --   --   HGB 13.4   < > 12.6* 13.8  HCT 38.6*   < > 37.0* 38.2*  MCV 83.7  --   --  81.4  PLT 182  --   --  188   < > = values  in this interval not displayed.   Basic Metabolic Panel Recent Labs    03/14/20 0414 03/15/20 0336  NA 136 136  K 3.8 4.0  CL 108 106  CO2 20* 20*  GLUCOSE 104* 100*  BUN 19 18  CREATININE 2.26* 2.49*  CALCIUM 9.4 9.3   Liver Function Tests Recent Labs    03/13/20 1213  AST 30  ALT 22  ALKPHOS 29*  BILITOT 0.4  PROT 7.6  ALBUMIN 3.8   No results for input(s): LIPASE, AMYLASE in the last 72 hours. High Sensitivity Troponin:   Recent Labs  Lab 03/13/20 1213 03/13/20 1548 03/13/20 1944  TROPONINIHS 14 >27,000* >27,000*    BNP Invalid input(s): POCBNP D-Dimer No results for input(s): DDIMER in the last 72 hours. Hemoglobin A1C Recent Labs    03/13/20 1230  HGBA1C 5.9*   Fasting Lipid Panel Recent Labs    03/14/20 0414  CHOL 190  HDL 26*  LDLCALC 140*  TRIG 120  CHOLHDL 7.3   Thyroid Function Tests No results for input(s): TSH, T4TOTAL, T3FREE, THYROIDAB in the last 72 hours.  Invalid input(s): FREET3 _____________  CARDIAC CATHETERIZATION  Result Date: 03/13/2020  Prox LAD lesion is 40% stenosed.  Mid LAD lesion is 80% stenosed.  2nd Mrg lesion is 100% stenosed.  Post intervention, there is a 0% residual stenosis.  A drug-eluting stent was successfully placed using a STENT RESOLUTE ONYX [id number].  LV end diastolic pressure is mildly elevated.  1. 2 vessel obstructive CAD    -80% mid LAD    -100% second OM 2. Elevated LVEDP 23 mm Hg 3. Successful PCI of the second OM with DES x 1 Plan: DAPT for one year. Hydrate. Monitor renal function closely. Depending on recovery of renal function consider staged PCI of the mid LAD. Will assess LV function by Echo.   DG Chest Port 1 View  Result Date: 03/13/2020 CLINICAL DATA:  Chest pain EXAM: PORTABLE CHEST 1 VIEW COMPARISON:  06/27/2011 FINDINGS: Cardiac shadow is stable. The lungs are well aerated bilaterally. No focal infiltrate or sizable effusion is seen. No bony abnormality is noted. IMPRESSION: No acute  abnormality noted. Electronically Signed   By: Inez Catalina M.D.   On: 03/13/2020 16:47   ECHOCARDIOGRAM COMPLETE  Result Date: 03/14/2020    ECHOCARDIOGRAM REPORT   Patient Name:   Tommy Dyer Date of Exam: 03/14/2020 Medical Rec #:  [id number]    Height:       72.0 in Accession #:    [id number]   Weight:       205.0 lb Date of Birth:  Jan 31, 1936    BSA:          2.153 m Patient Age:    84 years     BP:           140/75 mmHg Patient Gender: M            HR:           80 bpm. Exam Location:  Inpatient Procedure: 2D Echo, Cardiac Doppler and Color Doppler Indications:    CAD Native Vessel 414.01 / I25.10  History:        Patient has no prior history of Echocardiogram examinations.                 Arrythmias:STEMI; Risk Factors:Hypertension, Dyslipidemia and                 Former Smoker. GERD.  Sonographer:    Vickie Epley RDCS Referring Phys: [id number] PETER M Martinique IMPRESSIONS  1. Left ventricular ejection fraction, by estimation, is 55 to 60%. The left ventricle has normal function. The left ventricle has no regional wall motion abnormalities. Left ventricular diastolic parameters are consistent with Grade I diastolic dysfunction (impaired relaxation). Elevated left atrial pressure.  2. Right ventricular systolic function is normal. The right ventricular size is normal.  3. The mitral valve is normal in structure. No evidence of mitral valve regurgitation. No evidence of mitral stenosis.  4. The aortic valve is normal in structure. Aortic valve regurgitation is not visualized. Mild to moderate aortic valve sclerosis/calcification is present, without any evidence of aortic stenosis.  5. The inferior vena cava is normal in size with greater than 50% respiratory variability, suggesting right atrial pressure of 3 mmHg. FINDINGS  Left Ventricle: Left ventricular ejection fraction, by estimation, is 55 to 60%. The left ventricle has normal  function. The left ventricle has no regional wall motion abnormalities. The left  ventricular internal cavity size was normal in size. There is  no left ventricular hypertrophy. Left ventricular diastolic parameters are consistent with Grade I diastolic dysfunction (impaired relaxation). Elevated left atrial pressure.  LV Wall Scoring: The basal inferolateral segment and basal inferior segment are hypokinetic. Right Ventricle: The right ventricular size is normal. No increase in right ventricular wall thickness. Right ventricular systolic function is normal. Left Atrium: Left atrial size was normal in size. Right Atrium: Right atrial size was normal in size. Pericardium: There is no evidence of pericardial effusion. Mitral Valve: The mitral valve is normal in structure. There is moderate thickening of the mitral valve leaflet(s). There is moderate calcification of the mitral valve leaflet(s). Normal mobility of the mitral valve leaflets. Moderate mitral annular calcification. No evidence of mitral valve regurgitation. No evidence of mitral valve stenosis. Tricuspid Valve: The tricuspid valve is normal in structure. Tricuspid valve regurgitation is not demonstrated. No evidence of tricuspid stenosis. Aortic Valve: The aortic valve is normal in structure. Aortic valve regurgitation is not visualized. Mild to moderate aortic valve sclerosis/calcification is present, without any evidence of aortic stenosis. Pulmonic Valve: The pulmonic valve was normal in structure. Pulmonic valve regurgitation is not visualized. No evidence of pulmonic stenosis. Aorta: The aortic root is normal in size and structure. Venous: The inferior vena cava is normal in size with greater than 50% respiratory variability, suggesting right atrial pressure of 3 mmHg. IAS/Shunts: No atrial level shunt detected by color flow Doppler.  LEFT VENTRICLE PLAX 2D LVIDd:         4.20 cm     Diastology LVIDs:         3.20 cm     LV e' lateral:   6.74 cm/s LV PW:         0.90 cm     LV E/e' lateral: 9.2 LV IVS:        0.90 cm     LV e'  medial:    5.11 cm/s LVOT diam:     2.40 cm     LV E/e' medial:  12.1 LV SV:         78 LV SV Index:   36 LVOT Area:     4.52 cm  LV Volumes (MOD) LV vol d, MOD A2C: 72.0 ml LV vol d, MOD A4C: 97.7 ml LV vol s, MOD A2C: 39.0 ml LV vol s, MOD A4C: 39.9 ml LV SV MOD A2C:     33.0 ml LV SV MOD A4C:     97.7 ml LV SV MOD BP:      44.7 ml RIGHT VENTRICLE RV S prime:     17.30 cm/s TAPSE (M-mode): 1.9 cm LEFT ATRIUM           Index       RIGHT ATRIUM           Index LA diam:      3.30 cm 1.53 cm/m  RA Area:     10.90 cm LA Vol (A2C): 26.9 ml 12.49 ml/m RA Volume:   21.80 ml  10.13 ml/m LA Vol (A4C): 20.5 ml 9.52 ml/m  AORTIC VALVE LVOT Vmax:   87.70 cm/s LVOT Vmean:  62.300 cm/s LVOT VTI:    0.173 m  AORTA Ao Root diam: 3.60 cm Ao Asc diam:  3.50 cm MITRAL VALVE MV Area (PHT): 3.08 cm    SHUNTS MV Decel Time: 246 msec  Systemic VTI:  0.17 m MV E velocity: 61.70 cm/s  Systemic Diam: 2.40 cm MV A velocity: 91.30 cm/s MV E/A ratio:  0.68 Ena Dawley MD Electronically signed by Ena Dawley MD Signature Date/Time: 03/14/2020/10:17:55 AM    Final    Disposition   Pt is being discharged home today in good condition.  Follow-up Plans & Appointments     Follow-up Information    Martinique, Peter M, MD Follow up.   Specialty: Cardiology Why: The office should call you within 1-2 business days to arrange follow-up. If you do not hear from them within this time frame, please call the number provided.  Contact information: [address] [place] [address] [phone]              Discharge Instructions    Amb Referral to Cardiac Rehabilitation   Complete by: As directed    Diagnosis:  STEMI Coronary Stents PTCA     After initial evaluation and assessments completed: Virtual Based Care may be provided alone or in conjunction with Phase 2 Cardiac Rehab based on patient barriers.: Yes   Diet - low sodium heart healthy   Complete by: As directed    Discharge instructions    Complete by: As directed    PLEASE REMEMBER TO BRING ALL OF [place].  PLEASE ATTEND ALL SCHEDULED FOLLOW-UP APPOINTMENTS.   Activity: Increase activity slowly as tolerated. You may shower, but no soaking baths (or swimming) for 1 week. No driving for 1 week. No lifting over 10 lbs for 2 weeks. No sexual activity for 2 weeks.   You May Return to Work: in 1 week (if applicable)  Wound Care: You may wash cath site gently with soap and water. Keep cath site clean and dry. If you notice pain, swelling, bleeding or pus at your cath site, please call [phone].   Increase activity slowly   Complete by: As directed       Discharge Medications   Allergies as of 03/15/2020   No Known Allergies     Medication List    TAKE these medications   acetaminophen 500 MG tablet Commonly known as: TYLENOL Take 1,000 mg by mouth every 6 (six) hours as needed for headache (pain).   amLODipine 10 MG tablet Commonly known as: NORVASC Take 10 mg by mouth daily.   aspirin 81 MG EC tablet Take 1 tablet (81 mg total) by mouth daily. Swallow whole. Start taking on: March 16, 2020   atorvastatin 80 MG tablet Commonly known as: LIPITOR Take 1 tablet (80 mg total) by mouth daily. Start taking on: March 16, 2020   fenofibrate 54 MG tablet Take 54 mg by mouth daily.   fluticasone 50 MCG/ACT nasal spray Commonly known as: FLONASE Place 1 spray into both nostrils daily as needed for allergies or rhinitis.   metoprolol succinate 50 MG 24 hr tablet Commonly known as: TOPROL-XL Take 1 tablet (50 mg total) by mouth daily. Take with or immediately following a meal. Start taking on: March 16, 2020   nitroGLYCERIN 0.4 MG SL tablet Commonly known as: NITROSTAT Place 1 tablet (0.4 mg total) under the tongue every 5 (five) minutes x 3 doses as needed for chest pain.   PEPCID AC PO Take 1 tablet by mouth daily.   PREPARATION H EX Place 1 application  rectally daily as needed (hemorrhoids/pain/itching).   REFRESH TEARS OP Place 1 drop into both eyes daily as needed (dry  eyes).   sodium chloride 0.65 % Soln nasal spray Commonly known as: OCEAN Place 1 spray into both nostrils as needed for congestion.   tamsulosin 0.4 MG Caps capsule Commonly known as: FLOMAX TAKE 1 CAPSULE EVERY DAY  AFTER  SUPPER What changed: See the new instructions.   ticagrelor 90 MG Tabs tablet Commonly known as: BRILINTA Take 1 tablet (90 mg total) by mouth 2 (two) times daily.          Outstanding Labs/Studies   BMET at follow-up; FLP and LFT's in 6-8 weeks.   Duration of Discharge Encounter   Greater than 30 minutes including physician time.  Signed, Erma Heritage, PA-C 03/15/2020, 12:58 PM

## 2020-03-15 NOTE — Care Management (Signed)
Spoke w patient's wife. She states they are home already. She confirmed that they were given a coupon for Brilinta, and have filled it.

## 2020-03-16 ENCOUNTER — Encounter (HOSPITAL_COMMUNITY): Payer: Self-pay | Admitting: Cardiology

## 2020-03-16 ENCOUNTER — Telehealth: Payer: Self-pay | Admitting: General Practice

## 2020-03-16 MED FILL — Nitroglycerin IV Soln 100 MCG/ML in D5W: INTRA_ARTERIAL | Qty: 10 | Status: AC

## 2020-03-16 NOTE — Telephone Encounter (Signed)
Patient is scheduled for a TOC appointment on 03/24/20 at 11:15 AM per [place] Staff Message

## 2020-03-16 NOTE — Telephone Encounter (Signed)
Patient contacted regarding discharge from [facility] on 03/15/20.  Patient understands to follow up with provider Coletta Memos, NP on 03/24/20 at 11:15 am at [facility]. Patient understands discharge instructions? yes Patient understands medications and regiment? yes Patient understands to bring all medications to this visit? yes

## 2020-03-18 ENCOUNTER — Telehealth (HOSPITAL_COMMUNITY): Payer: Self-pay

## 2020-03-18 NOTE — Telephone Encounter (Signed)
Called patient to see if he is interested in the Cardiac Rehab Program. Patient expressed interest. Explained scheduling process and went over insurance, patient verbalized understanding. Will contact patient for scheduling once f/u has been completed.  °

## 2020-03-18 NOTE — Telephone Encounter (Signed)
Pt insurance is active and benefits verified through [facility]. Co-pay $10.00, DED $0.00/$0.00 met, out of pocket $3,900.00/$125.00 met, co-insurance 0%. No pre-authorization required. Passport, 03/18/20 @ 3:43PM, VDP#[id number]  Will contact patient to see if he is interested in the Cardiac Rehab Program. If interested, patient will need to complete follow up appt. Once completed, patient will be contacted for scheduling upon review by the RN Navigator.

## 2020-03-20 ENCOUNTER — Telehealth: Payer: Self-pay | Admitting: General Practice

## 2020-03-20 NOTE — Telephone Encounter (Signed)
Spoke with pt, he reports watery diarrhea. Patient given the okay to stop the atorvastatin today. He has a follow up appointment 6/29 and can follow up on symptoms at that time. Pt agreed with this plan.

## 2020-03-20 NOTE — Telephone Encounter (Signed)
    Pt c/o medication issue:  1. Name of Medication: atorvastatin (LIPITOR) 80 MG tablet   metoprolol succinate (TOPROL-XL) 50 MG 24 hr tablet  ticagrelor (BRILINTA) 90 MG TABS tablet   2. How are you currently taking this medication (dosage and times per day)?   3. Are you having a reaction (difficulty breathing--STAT)?   4. What is your medication issue? Pt said he's having reaction in one of this meds, he wasn't sure which one but this are the new meds he's taking when he was in the hospital. He said he's having diarrhea and weakness

## 2020-03-21 NOTE — Progress Notes (Signed)
Cardiology Clinic Note   Patient Name: Tommy Dyer Date of Encounter: 03/24/2020  Primary Care Provider:  [name], MD Primary Cardiologist:  Peter Martinique, MD  Patient Profile    Tommy Dyer 84 year old male presents today for follow-up of his essential hypertension, STEMI (proximal LAD 40%, mid LAD 80%, second marginal 100% with DES x1, LVEF 55-60%).  Past Medical History    Past Medical History:  Diagnosis Date  . Arthritis   . CAD (coronary artery disease)    a. 02/2020: STEMI with DES to 100% 2nd Mrg stenosis. Residual 80% mid-LAD stenosis with consideration of staged PCI recommended.    . Gastric ulcer   . GERD (gastroesophageal reflux disease)   . History of kidney stones   . Hypercholesterolemia   . Hypertension   . Kidney stones   . Osteoporosis   . Prostate cancer [facility])    Past Surgical History:  Procedure Laterality Date  . colonscopy    . CORONARY/GRAFT ACUTE MI REVASCULARIZATION N/A 03/13/2020   Procedure: CORONARY/GRAFT ACUTE MI REVASCULARIZATION;  Surgeon: Martinique, Peter M, MD;  Location: [place] CV LAB;  Service: Cardiovascular;  Laterality: N/A;  . EXTRACORPOREAL SHOCK WAVE LITHOTRIPSY Right 06/21/2018   Procedure: RIGHT EXTRACORPOREAL SHOCK WAVE LITHOTRIPSY (ESWL);  Surgeon: Ardis Hughs, MD;  Location: [facility];  Service: Urology;  Laterality: Right;  . LEFT HEART CATH AND CORONARY ANGIOGRAPHY N/A 03/13/2020   Procedure: LEFT HEART CATH AND CORONARY ANGIOGRAPHY;  Surgeon: Martinique, Peter M, MD;  Location: [place] CV LAB;  Service: Cardiovascular;  Laterality: N/A;  . PROSTATE BIOPSY    . PROSTATE BIOPSY      Allergies  No Known Allergies  History of Present Illness    Tommy Dyer has a PMH of  essential hypertension, STEMI (proximal LAD 40%, mid LAD 80%, second marginal 100% with DES x1, LVEF 55-60%), HLD, nephrolithiasis, stage IV CKD, and prostate cancer.  He presented to [facility]/13/2020 for evaluation of his chest  pain.  He presented to the cardiac Cath Lab and received DES x1.  Recommendation for DAPT x1 year.  Renal function being monitored for possible staged PCI of mid LAD.  Echocardiogram 03/14/2020 showed LVEF 55 to 60%, G1 DD.  Started on metoprolol XL, continue amlodipine, Brilinta, aspirin.  He presents to the clinic today for follow-up evaluation and states he feels well.  He has started to slowly increase his physical activity.  He has questions about the diet he should be following.  He states that he was eating a lot of baked fish and baked chicken currently.  He notes that his a atorvastatin may have given him diarrhea.  However, he has a 90-day supply and is not sure that this is what caused his upset stomach.  He would like to try the atorvastatin and see if it again makes him have diarrhea.  Plan is to transition him to rosuvastatin 20 if he has repeat GI upset.  I will give him the salty 6 diet sheet, have him continue to increase his physical activity as tolerated, and have him follow-up with Dr. Martinique in 3 months.  Today he denies chest pain, shortness of breath, lower extremity edema, fatigue, palpitations, melena, hematuria, hemoptysis, diaphoresis, weakness, presyncope, syncope, orthopnea, and PND.  Home Medications    Prior to Admission medications   Medication Sig Start Date End Date Taking? Authorizing Provider  acetaminophen (TYLENOL) 500 MG tablet Take 1,000 mg by mouth every 6 (six) hours as needed for  headache (pain).    [provider]  amLODipine (NORVASC) 10 MG tablet Take 10 mg by mouth daily.    [provider]  aspirin EC 81 MG EC tablet Take 1 tablet (81 mg total) by mouth daily. Swallow whole. 03/16/20   Strader, Fransisco Hertz, PA-C  atorvastatin (LIPITOR) 80 MG tablet Take 1 tablet (80 mg total) by mouth daily. 03/16/20   Strader, Fransisco Hertz, PA-C  Carboxymethylcellulose Sodium (REFRESH TEARS OP) Place 1 drop into both eyes daily as needed (dry eyes).      [provider]  Famotidine (PEPCID AC PO) Take 1 tablet by mouth daily.    [provider]  fenofibrate 54 MG tablet Take 54 mg by mouth daily.    [provider]  fluticasone (FLONASE) 50 MCG/ACT nasal spray Place 1 spray into both nostrils daily as needed for allergies or rhinitis.     [provider]  Lidocaine-Glycerin (PREPARATION H EX) Place 1 application rectally daily as needed (hemorrhoids/pain/itching).     [provider]  metoprolol succinate (TOPROL-XL) 50 MG 24 hr tablet Take 1 tablet (50 mg total) by mouth daily. Take with or immediately following a meal. 03/16/20   Strader, [place] M, PA-C  nitroGLYCERIN (NITROSTAT) 0.4 MG SL tablet Place 1 tablet (0.4 mg total) under the tongue every 5 (five) minutes x 3 doses as needed for chest pain. 03/15/20   Strader, Fransisco Hertz, PA-C  sodium chloride (OCEAN) 0.65 % SOLN nasal spray Place 1 spray into both nostrils as needed for congestion.    [provider]  tamsulosin (FLOMAX) 0.4 MG CAPS capsule TAKE 1 CAPSULE EVERY DAY  AFTER  SUPPER Patient taking differently: Take 0.4 mg by mouth See admin instructions. Take one capsule (0.4 mg) by mouth every other day after supper 06/17/15   Tyler Pita, MD  ticagrelor (BRILINTA) 90 MG TABS tablet Take 1 tablet (90 mg total) by mouth 2 (two) times daily. 03/15/20   Erma Heritage, PA-C    Family History    Family History  Problem Relation Age of Onset  . Cancer Mother        unknown  . Diabetes Mother   . High blood pressure Mother   . Diabetes Father   . High blood pressure Father   . Breast cancer Maternal Grandmother        breast  . Colon cancer Neg Hx   . Stomach cancer Neg Hx    He indicated that his mother is deceased. He indicated that his father is deceased. He indicated that the status of his maternal grandmother is unknown. He indicated that the status of his neg hx is unknown.  Social History    Social History    Socioeconomic History  . Marital status: Married    Spouse name: Not on file  . Number of children: Not on file  . Years of education: Not on file  . Highest education level: Not on file  Occupational History  . Not on file  Tobacco Use  . Smoking status: Former Smoker    Packs/day: 1.00    Years: 20.00    Pack years: 20.00    Quit date: 09/27/2003    Years since quitting: 16.5  . Smokeless tobacco: Never Used  Vaping Use  . Vaping Use: Never used  Substance and Sexual Activity  . Alcohol use: No  . Drug use: No  . Sexual activity: Not on file  Other Topics Concern  . Not  on file  Social History Narrative  . Not on file   Social Determinants of Health   Financial Resource Strain:   . Difficulty of Paying Living Expenses:   Food Insecurity:   . Worried About Charity fundraiser in the Last Year:   . Arboriculturist in the Last Year:   Transportation Needs:   . Film/video editor (Medical):   Marland Kitchen Lack of Transportation (Non-Medical):   Physical Activity:   . Days of Exercise per Week:   . Minutes of Exercise per Session:   Stress:   . Feeling of Stress :   Social Connections:   . Frequency of Communication with Friends and Family:   . Frequency of Social Gatherings with Friends and Family:   . Attends Religious Services:   . Active Member of Clubs or Organizations:   . Attends Archivist Meetings:   Marland Kitchen Marital Status:   Intimate Partner Violence:   . Fear of Current or Ex-Partner:   . Emotionally Abused:   Marland Kitchen Physically Abused:   . Sexually Abused:      Review of Systems    General:  No chills, fever, night sweats or weight changes.  Cardiovascular:  No chest pain, dyspnea on exertion, edema, orthopnea, palpitations, paroxysmal nocturnal dyspnea. Dermatological: No rash, lesions/masses Respiratory: No cough, dyspnea Urologic: No hematuria, dysuria Abdominal:   No nausea, vomiting, diarrhea, bright red blood per rectum, melena, or  hematemesis Neurologic:  No visual changes, wkns, changes in mental status. All other systems reviewed and are otherwise negative except as noted above.  Physical Exam    VS:  BP 132/70 (BP Location: Left Arm, Patient Position: Sitting, Cuff Size: Normal)   Pulse 93   Ht 6' (1.829 m)   Wt 200 lb 6.4 oz (90.9 kg)   SpO2 96%   BMI 27.18 kg/m  , BMI Body mass index is 27.18 kg/m. GEN: Well nourished, well developed, in no acute distress. HEENT: normal. Neck: Supple, no JVD, carotid bruits, or masses. Cardiac: RRR, no murmurs, rubs, or gallops. No clubbing, cyanosis, edema.  Radials/DP/PT 2+ and equal bilaterally.  Respiratory:  Respirations regular and unlabored, clear to auscultation bilaterally. GI: Soft, nontender, nondistended, BS + x 4. MS: no deformity or atrophy. Skin: warm and dry, no rash.  Right radial cath site clean dry intact no drainage Neuro:  Strength and sensation are intact. Psych: Normal affect.  Accessory Clinical Findings    ECG personally reviewed by me today-sinus rhythm anterior infarct undetermined age T wave abnormality consider lateral ischemia 88 bpm- No acute changes  EKG 03/15/2020 Sinus rhythm 88 bpm  Echocardiogram 03/14/2020 IMPRESSIONS    1. Left ventricular ejection fraction, by estimation, is 55 to 60%. The  left ventricle has normal function. The left ventricle has no regional  wall motion abnormalities. Left ventricular diastolic parameters are  consistent with Grade I diastolic  dysfunction (impaired relaxation). Elevated left atrial pressure.  2. Right ventricular systolic function is normal. The right ventricular  size is normal.  3. The mitral valve is normal in structure. No evidence of mitral valve  regurgitation. No evidence of mitral stenosis.  4. The aortic valve is normal in structure. Aortic valve regurgitation is  not visualized. Mild to moderate aortic valve sclerosis/calcification is  present, without any evidence of  aortic stenosis.  5. The inferior vena cava is normal in size with greater than 50%  respiratory variability, suggesting right atrial pressure of 3 mmHg.  Cardiac  catheterization 03/13/2020  Prox LAD lesion is 40% stenosed.  Mid LAD lesion is 80% stenosed.  2nd Mrg lesion is 100% stenosed.  Post intervention, there is a 0% residual stenosis.  A drug-eluting stent was successfully placed using a STENT RESOLUTE ONYX [id number].  LV end diastolic pressure is mildly elevated.   1. 2 vessel obstructive CAD    -80% mid LAD    -100% second OM 2. Elevated LVEDP 23 mm Hg 3. Successful PCI of the second OM with DES x 1  Plan: DAPT for one year. Hydrate. Monitor renal function closely. Depending on recovery of renal function consider staged PCI of the mid LAD. Will assess LV function by Echo.   Diagnostic Dominance: Left  Intervention     Assessment & Plan   1.  STEMI-no chest pain today.proximal LAD 40%, mid LAD 80%, second marginal 100% with DES x1, LVEF 55-60%.  Of note was considered for staged PCI however creatinine bumped from 2.26-2.49.  We will continue to monitor Continue Brilinta, aspirin, amlodipine, metoprolol succinate, nitroglycerin Heart healthy low-sodium diet-salty 6 given Increase physical activity as tolerated  Essential hypertension-BP today 132/70.  Well-controlled at home. Continue amlodipine, metoprolol Heart healthy low-sodium diet-salty 6 given Increase physical activity as tolerated  Hyperlipidemia-03/14/2020: Cholesterol 190; HDL 26; LDL Cholesterol 140; Triglycerides 120; VLDL 24 Continue atorvastatin (may transition to Crestor/rosuvastatin if GI upset on atorvastatin) Heart healthy low-sodium high-fiber diet Increase physical activity as tolerated  Disposition: Follow-up with Dr. Martinique in 3 months.   Jossie Ng. Brittny Spangle NP-C    03/24/2020, 11:49 AM [address] Office [phone] Fax [phone]

## 2020-03-23 DIAGNOSIS — E7800 Pure hypercholesterolemia, unspecified: Secondary | ICD-10-CM | POA: Diagnosis not present

## 2020-03-23 DIAGNOSIS — I10 Essential (primary) hypertension: Secondary | ICD-10-CM | POA: Diagnosis not present

## 2020-03-23 DIAGNOSIS — I213 ST elevation (STEMI) myocardial infarction of unspecified site: Secondary | ICD-10-CM | POA: Diagnosis not present

## 2020-03-23 DIAGNOSIS — C61 Malignant neoplasm of prostate: Secondary | ICD-10-CM | POA: Diagnosis not present

## 2020-03-23 DIAGNOSIS — I2510 Atherosclerotic heart disease of native coronary artery without angina pectoris: Secondary | ICD-10-CM | POA: Diagnosis not present

## 2020-03-23 DIAGNOSIS — R197 Diarrhea, unspecified: Secondary | ICD-10-CM | POA: Diagnosis not present

## 2020-03-23 DIAGNOSIS — N1831 Chronic kidney disease, stage 3a: Secondary | ICD-10-CM | POA: Diagnosis not present

## 2020-03-23 DIAGNOSIS — R7303 Prediabetes: Secondary | ICD-10-CM | POA: Diagnosis not present

## 2020-03-24 ENCOUNTER — Encounter: Payer: Self-pay | Admitting: General Practice

## 2020-03-24 ENCOUNTER — Ambulatory Visit: Payer: Medicare HMO | Admitting: General Practice

## 2020-03-24 ENCOUNTER — Other Ambulatory Visit: Payer: Self-pay

## 2020-03-24 VITALS — BP 132/70 | HR 93 | Ht 72.0 in | Wt 200.4 lb

## 2020-03-24 DIAGNOSIS — E7800 Pure hypercholesterolemia, unspecified: Secondary | ICD-10-CM

## 2020-03-24 DIAGNOSIS — I10 Essential (primary) hypertension: Secondary | ICD-10-CM | POA: Diagnosis not present

## 2020-03-24 DIAGNOSIS — I2121 ST elevation (STEMI) myocardial infarction involving left circumflex coronary artery: Secondary | ICD-10-CM

## 2020-03-24 NOTE — Patient Instructions (Signed)
Medication Instructions:  The current medical regimen is effective;  continue present plan and medications as directed. Please refer to the Current Medication list given to you today. *If you need a refill on your cardiac medications before your next appointment, please call your pharmacy*  Special Instructions PLEASE READ AND FOLLOW SALTY 6-ATTACHED  PLEASE INCREASE PHYSICAL ACTIVITY AS TOLERATED  Ok TO START CARDIAC REHAB  Follow-Up: Your next appointment:  3 month(s) In Person with Peter Martinique, MD [place], FNP-C  At [facility], you and your health needs are our priority.  As part of our continuing mission to provide you with exceptional heart care, we have created designated Provider Care Teams.  These Care Teams include your primary Cardiologist (physician) and Advanced Practice Providers (APPs -  Physician Assistants and Nurse Practitioners) who all work together to provide you with the care you need, when you need it.  We recommend signing up for the patient portal called "MyChart".  Sign up information is provided on this After Visit Summary.  MyChart is used to connect with patients for Virtual Visits (Telemedicine).  Patients are able to view lab/test results, encounter notes, upcoming appointments, etc.  Non-urgent messages can be sent to your provider as well.   To learn more about what you can do with MyChart, go to [email or web address].

## 2020-03-25 ENCOUNTER — Encounter (HOSPITAL_COMMUNITY): Payer: Self-pay

## 2020-03-25 DIAGNOSIS — H6121 Impacted cerumen, right ear: Secondary | ICD-10-CM | POA: Diagnosis not present

## 2020-03-25 DIAGNOSIS — H8113 Benign paroxysmal vertigo, bilateral: Secondary | ICD-10-CM | POA: Diagnosis not present

## 2020-03-25 DIAGNOSIS — I10 Essential (primary) hypertension: Secondary | ICD-10-CM | POA: Diagnosis not present

## 2020-03-25 NOTE — Progress Notes (Signed)
Cardiac Rehab Note:  Clinical review of pt follow up appt on 03/24/20 with Coletta Memos, NP - cardiologist office note. Pt is making the expected progress in recovery.  Pt appropriate for scheduling for on site cardiac rehab and/or enrollment in Virtual Cardiac Rehab.  Pt Covid score is 6.  Will forward to staff for follow up.  Minard Millirons E. Rollene Rotunda RN, BSN [place]. [facility]  Cardiac and Pulmonary Rehabilitation Phone: [phone] Fax: [phone]

## 2020-03-26 ENCOUNTER — Encounter (HOSPITAL_COMMUNITY): Payer: Self-pay | Admitting: Emergency Medicine

## 2020-03-26 ENCOUNTER — Telehealth (HOSPITAL_COMMUNITY): Payer: Self-pay

## 2020-03-26 ENCOUNTER — Emergency Department (HOSPITAL_COMMUNITY): Payer: Medicare HMO

## 2020-03-26 ENCOUNTER — Emergency Department (HOSPITAL_COMMUNITY)
Admission: EM | Admit: 2020-03-26 | Discharge: 2020-03-27 | Disposition: A | Discharge status: Home / Self Care | Payer: Medicare HMO | Attending: Emergency Medicine | Admitting: Emergency Medicine

## 2020-03-26 ENCOUNTER — Other Ambulatory Visit: Payer: Self-pay

## 2020-03-26 DIAGNOSIS — R42 Dizziness and giddiness: Secondary | ICD-10-CM | POA: Insufficient documentation

## 2020-03-26 DIAGNOSIS — Z8546 Personal history of malignant neoplasm of prostate: Secondary | ICD-10-CM | POA: Diagnosis not present

## 2020-03-26 DIAGNOSIS — Z79899 Other long term (current) drug therapy: Secondary | ICD-10-CM | POA: Diagnosis not present

## 2020-03-26 DIAGNOSIS — I129 Hypertensive chronic kidney disease with stage 1 through stage 4 chronic kidney disease, or unspecified chronic kidney disease: Secondary | ICD-10-CM | POA: Insufficient documentation

## 2020-03-26 DIAGNOSIS — Z87891 Personal history of nicotine dependence: Secondary | ICD-10-CM | POA: Insufficient documentation

## 2020-03-26 DIAGNOSIS — Z20822 Contact with and (suspected) exposure to covid-19: Secondary | ICD-10-CM | POA: Insufficient documentation

## 2020-03-26 DIAGNOSIS — R0789 Other chest pain: Secondary | ICD-10-CM | POA: Insufficient documentation

## 2020-03-26 DIAGNOSIS — I2510 Atherosclerotic heart disease of native coronary artery without angina pectoris: Secondary | ICD-10-CM | POA: Insufficient documentation

## 2020-03-26 DIAGNOSIS — N184 Chronic kidney disease, stage 4 (severe): Secondary | ICD-10-CM | POA: Diagnosis not present

## 2020-03-26 DIAGNOSIS — Z7982 Long term (current) use of aspirin: Secondary | ICD-10-CM | POA: Diagnosis not present

## 2020-03-26 DIAGNOSIS — R61 Generalized hyperhidrosis: Secondary | ICD-10-CM | POA: Insufficient documentation

## 2020-03-26 DIAGNOSIS — I4891 Unspecified atrial fibrillation: Secondary | ICD-10-CM | POA: Diagnosis not present

## 2020-03-26 HISTORY — DX: ST elevation (STEMI) myocardial infarction of unspecified site: I21.3

## 2020-03-26 LAB — BASIC METABOLIC PANEL
Anion gap: 9 (ref 5–15)
BUN: 38 mg/dL — ABNORMAL HIGH (ref 8–23)
CO2: 19 mmol/L — ABNORMAL LOW (ref 22–32)
Calcium: 9.5 mg/dL (ref 8.9–10.3)
Chloride: 106 mmol/L (ref 98–111)
Creatinine, Ser: 2.83 mg/dL — ABNORMAL HIGH (ref 0.61–1.24)
GFR calc Af Amer: 23 mL/min — ABNORMAL LOW (ref >60)
GFR calc non Af Amer: 20 mL/min — ABNORMAL LOW (ref >60)
Glucose, Bld: 111 mg/dL — ABNORMAL HIGH (ref 70–99)
Potassium: 3.5 mmol/L (ref 3.5–5.1)
Sodium: 134 mmol/L — ABNORMAL LOW (ref 135–145)

## 2020-03-26 LAB — CBC
HCT: 37.8 % — ABNORMAL LOW (ref 39.0–52.0)
Hemoglobin: 13.3 g/dL (ref 13.0–17.0)
MCH: 28.7 pg (ref 26.0–34.0)
MCHC: 35.2 g/dL (ref 30.0–36.0)
MCV: 81.5 fL (ref 80.0–100.0)
Platelets: 251 10*3/uL (ref 150–400)
RBC: 4.64 MIL/uL (ref 4.22–5.81)
RDW: 14 % (ref 11.5–15.5)
WBC: 10.2 10*3/uL (ref 4.0–10.5)
nRBC: 0 % (ref 0.0–0.2)

## 2020-03-26 LAB — TROPONIN I (HIGH SENSITIVITY): Troponin I (High Sensitivity): 66 ng/L — ABNORMAL HIGH (ref <18)

## 2020-03-26 MED ORDER — SODIUM CHLORIDE 0.9 % IV BOLUS
1000.0000 mL | Freq: Once | INTRAVENOUS | Status: AC
  Administered 2020-03-26: 1000 mL via INTRAVENOUS

## 2020-03-26 MED ORDER — SODIUM CHLORIDE 0.9% FLUSH: 3.0000 mL | Freq: Once | INTRAVENOUS | Status: DC

## 2020-03-26 MED ORDER — ONDANSETRON HCL 4 MG/2ML IJ SOLN
4.0000 mg | Freq: Once | INTRAMUSCULAR | Status: AC
  Administered 2020-03-26: 4 mg via INTRAVENOUS
  Filled 2020-03-26: qty 2

## 2020-03-26 NOTE — Telephone Encounter (Signed)
Called patient to see if he was interested in participating in the Cardiac Rehab Program. Patient stated yes. Patient will come in for orientation on 04/23/20 @ 9AM and will attend the 1:15PM exercise class.  Mailed letter

## 2020-03-26 NOTE — ED Provider Notes (Addendum)
[facility] EMERGENCY DEPARTMENT Provider Note   CSN: [id number] Arrival date & time: 03/26/20  2204     History Chief Complaint  Patient presents with  . Dizziness    Tommy Dyer is a 84 y.o. male.  The history is provided by the patient and medical records.   84 y.o. male with history of arthritis, gastric ulcers, GERD, hyperlipidemia, hypertension, prostate cancer, CAD with STEMI 03/13/2020 status post DES to to 2nd OM, presenting to the ED for dizziness.  States over the past several day he has had a some GI upset-- felt it was likely due to his atorvastatin so was told to hold it for a few days and resumed yesterday.  States today he has felt dizzy, has history of vertigo, and has had nausea.  Symptoms do seem worse with movement.  He has vomited x1 on arrival to the ED.  He reports he has had intermittent "twinges" in the chest but no significant pain.  He did have some SOB and sweating today.  No fevers, chills, sweats.  States he has been eating fine-- grilled chicken and salmon, veggies.  He has been complaint with his ASA and brillinta.  No issues with bleeding.  Past Medical History:  Diagnosis Date  . Arthritis   . CAD (coronary artery disease)    a. 02/2020: STEMI with DES to 100% 2nd Mrg stenosis. Residual 80% mid-LAD stenosis with consideration of staged PCI recommended.    . Gastric ulcer   . GERD (gastroesophageal reflux disease)   . History of kidney stones   . Hypercholesterolemia   . Hypertension   . Kidney stones   . Osteoporosis   . Prostate cancer ([place])   . STEMI (ST elevation myocardial infarction) [facility])     Patient Active Problem List   Diagnosis Date Noted  . STEMI involving left circumflex coronary artery ([place]) 03/13/2020  . Hyperlipidemia 03/13/2020  . CKD (chronic kidney disease) stage 4, GFR 15-29 ml/min (HCC) 03/13/2020  . ST elevation myocardial infarction involving left circumflex coronary artery ([place]) 03/13/2020  .  Adenocarcinoma of prostate ([place]) 10/15/2013  . NEURAL HEARING LOSS BILATERAL 09/30/2008  . Essential hypertension 09/30/2008  . ALLERGIC RHINITIS 09/30/2008  . GERD 09/30/2008  . OSTEOARTHRITIS 09/30/2008  . OSTEOPOROSIS 09/30/2008  . COLONIC POLYPS, HX OF 09/30/2008  . NEPHROLITHIASIS, HX OF 09/30/2008    Past Surgical History:  Procedure Laterality Date  . colonscopy    . CORONARY/GRAFT ACUTE MI REVASCULARIZATION N/A 03/13/2020   Procedure: CORONARY/GRAFT ACUTE MI REVASCULARIZATION;  Surgeon: Martinique, Peter M, MD;  Location: [place] CV LAB;  Service: Cardiovascular;  Laterality: N/A;  . EXTRACORPOREAL SHOCK WAVE LITHOTRIPSY Right 06/21/2018   Procedure: RIGHT EXTRACORPOREAL SHOCK WAVE LITHOTRIPSY (ESWL);  Surgeon: Ardis Hughs, MD;  Location: [facility];  Service: Urology;  Laterality: Right;  . LEFT HEART CATH AND CORONARY ANGIOGRAPHY N/A 03/13/2020   Procedure: LEFT HEART CATH AND CORONARY ANGIOGRAPHY;  Surgeon: Martinique, Peter M, MD;  Location: [place] [facility] LAB;  Service: Cardiovascular;  Laterality: N/A;  . PROSTATE BIOPSY    . PROSTATE BIOPSY         Family History  Problem Relation Age of Onset  . Cancer Mother        unknown  . Diabetes Mother   . High blood pressure Mother   . Diabetes Father   . High blood pressure Father   . Breast cancer Maternal Grandmother        breast  .  Colon cancer Neg Hx   . Stomach cancer Neg Hx     Social History   Tobacco Use  . Smoking status: Former Smoker    Packs/day: 1.00    Years: 20.00    Pack years: 20.00    Quit date: 09/27/2003    Years since quitting: 16.5  . Smokeless tobacco: Never Used  Vaping Use  . Vaping Use: Never used  Substance Use Topics  . Alcohol use: No  . Drug use: No    Home Medications Prior to Admission medications   Medication Sig Start Date End Date Taking? Authorizing Provider  acetaminophen (TYLENOL) 500 MG tablet Take 1,000 mg by mouth every 6 (six) hours as needed for headache  (pain).    [provider]  amLODipine (NORVASC) 10 MG tablet Take 10 mg by mouth daily.    [provider]  aspirin EC 81 MG EC tablet Take 1 tablet (81 mg total) by mouth daily. Swallow whole. 03/16/20   Strader, Fransisco Hertz, PA-C  Carboxymethylcellulose Sodium (REFRESH TEARS OP) Place 1 drop into both eyes daily as needed (dry eyes).     [provider]  Famotidine (PEPCID AC PO) Take 1 tablet by mouth daily.    [provider]  fenofibrate 54 MG tablet Take 54 mg by mouth daily.    [provider]  fluticasone (FLONASE) 50 MCG/ACT nasal spray Place 1 spray into both nostrils daily as needed for allergies or rhinitis.     [provider]  Lidocaine-Glycerin (PREPARATION H EX) Place 1 application rectally daily as needed (hemorrhoids/pain/itching).     [provider]  metoprolol succinate (TOPROL-XL) 50 MG 24 hr tablet Take 1 tablet (50 mg total) by mouth daily. Take with or immediately following a meal. 03/16/20   Strader, [place] M, PA-C  nitroGLYCERIN (NITROSTAT) 0.4 MG SL tablet Place 1 tablet (0.4 mg total) under the tongue every 5 (five) minutes x 3 doses as needed for chest pain. 03/15/20   Strader, Fransisco Hertz, PA-C  Polyvinyl Alcohol-Povidone (REFRESH OP) Apply to eye.    [provider]  sodium chloride (OCEAN) 0.65 % SOLN nasal spray Place 1 spray into both nostrils as needed for congestion.    [provider]  tamsulosin (FLOMAX) 0.4 MG CAPS capsule TAKE 1 CAPSULE EVERY DAY  AFTER  SUPPER Patient taking differently: Take 0.4 mg by mouth See admin instructions. Take one capsule (0.4 mg) by mouth every other day after supper 06/17/15   Tyler Pita, MD  ticagrelor (BRILINTA) 90 MG TABS tablet Take 1 tablet (90 mg total) by mouth 2 (two) times daily. 03/15/20   Erma Heritage, PA-C    Allergies    Patient has no known allergies.  Review of Systems   Review of Systems  Constitutional: Positive for  diaphoresis.  Respiratory: Positive for shortness of breath.   Gastrointestinal: Positive for nausea and vomiting.  Skin: Negative for wound.  Neurological: Positive for dizziness. Negative for numbness.  All other systems reviewed and are negative.   Physical Exam Updated Vital Signs BP (!) 146/81 (BP Location: Right Arm)   Pulse 80   Temp 97.7 F (36.5 C) (Oral)   Resp 16   Ht 6' (1.829 m)   Wt 90.7 kg   SpO2 98%   BMI 27.12 kg/m   Physical Exam Vitals and nursing note reviewed.  Constitutional:      Appearance: He is well-developed.  HENT:     Head: Normocephalic and atraumatic.  Eyes:     Conjunctiva/sclera: Conjunctivae normal.     Pupils: Pupils are equal, round, and reactive to light.  Cardiovascular:     Rate and Rhythm: Normal rate and regular rhythm.     Heart sounds: Normal heart sounds.  Pulmonary:     Effort: Pulmonary effort is normal. No respiratory distress.     Breath sounds: Normal breath sounds. No rhonchi.  Abdominal:     General: Bowel sounds are normal.     Palpations: Abdomen is soft.     Tenderness: There is no abdominal tenderness. There is no rebound.  Musculoskeletal:        General: Normal range of motion.     Cervical back: Normal range of motion.  Skin:    General: Skin is warm and dry.  Neurological:     Mental Status: He is alert and oriented to person, place, and time.     Comments: AAOx3, answering questions and following commands appropriately; equal strength UE and LE bilaterally; CN grossly intact; moves all extremities appropriately without ataxia; no focal neuro deficits or facial asymmetry appreciated, speech clear and goal oriented     ED Results / Procedures / Treatments   Labs (all labs ordered are listed, but only abnormal results are displayed) Labs Reviewed  BASIC METABOLIC PANEL - Abnormal; Notable for the following components:      Result Value   Sodium 134 (*)    CO2 19 (*)    Glucose, Bld 111 (*)    BUN 38  (*)    Creatinine, Ser 2.83 (*)    GFR calc non Af Amer 20 (*)    GFR calc Af Amer 23 (*)    All other components within normal limits  CBC - Abnormal; Notable for the following components:   HCT 37.8 (*)    All other components within normal limits  TROPONIN I (HIGH SENSITIVITY) - Abnormal; Notable for the following components:   Troponin I (High Sensitivity) 66 (*)    All other components within normal limits  TROPONIN I (HIGH SENSITIVITY) - Abnormal; Notable for the following components:   Troponin I (High Sensitivity) 56 (*)    All other components within normal limits  SARS CORONAVIRUS 2 BY RT PCR [facility] ORDER, [place] [facility])    EKG EKG Interpretation  Date/Time:  Thursday March 26 2020 22:43:39 EDT Ventricular Rate:  71 PR Interval:    QRS Duration: 115 QT Interval:  397 QTC Calculation: 432 R Axis:   -6 Text Interpretation: Sinus rhythm Nonspecific intraventricular conduction delay Low voltage, precordial leads Confirmed by Orpah Greek [phone]) on 03/26/2020 11:04:35 PM   Radiology DG Chest 2 View  Result Date: 03/26/2020 CLINICAL DATA:  Dizziness and vertigo for 2 days, feels like food is stuck in throat EXAM: CHEST - 2 VIEW COMPARISON:  03/13/2020 FINDINGS: Frontal and lateral views of the chest demonstrate an unremarkable cardiac silhouette. Stable areas of scarring without airspace disease, effusion, or pneumothorax. No acute bony abnormalities. IMPRESSION: 1. Stable exam, no acute process. Electronically Signed   By: Randa Ngo M.D.   On: 03/26/2020 22:42    Procedures Procedures (including critical care time)  CRITICAL CARE Performed by: Larene Pickett   Total critical care time: 35 minutes  Critical care time was exclusive of separately billable procedures and treating other patients.  Critical care was necessary to treat or prevent imminent or life-threatening deterioration.  Critical care was time spent personally  by  me on the following activities: development of treatment plan with patient and/or surrogate as well as nursing, discussions with consultants, evaluation of patient's response to treatment, examination of patient, obtaining history from patient or surrogate, ordering and performing treatments and interventions, ordering and review of laboratory studies, ordering and review of radiographic studies, pulse oximetry and re-evaluation of patient's condition..   Medications Ordered in ED Medications  sodium chloride flush (NS) 0.9 % injection 3 mL (3 mLs Intravenous Not Given 03/26/20 2240)  ondansetron (ZOFRAN) injection 4 mg (4 mg Intravenous Given 03/26/20 2343)  sodium chloride 0.9 % bolus 1,000 mL (1,000 mLs Intravenous New Bag/Given 03/26/20 2342)    ED Course  I have reviewed the triage vital signs and the nursing notes.  Pertinent labs & imaging results that were available during my care of the patient were reviewed by me and considered in my medical decision making (see chart for details).    MDM Rules/Calculators/A&P  84 y.o. M with recent STEMI on 03/13/20, presenting to the ED with dizziness, diaphoresis, nausea, and vomiting over the past 24 hours.  Has had some GI upset from atorvastatin recently.  Reports "twinges" of chest discomfort but denies "pain".  He is afebrile, non-toxic.  He did vomit x1 on arrival to ED treatment room, HR went up to 150's then slowly returned back to normal.  EKG fairly unchanged from post cath.  Labs pending.  Given IVF, zofran.  No focal deficits on exam to suggest acute CVA.  Has hx of vertigo, takes meclizine.  Labs as above-- does have a mild AKI with SrCr of 2.86 (baseline is around 2.5).  May be from GI upset.  Trop 66, question if this is still downtrending from prior.  Will discuss with cardiology.  Discussed with Dr. Ailene Ravel-- he will see patient.  4:21 AM Re discussed with Dr. Ailene Ravel-- has evaluated patient.  States he did note patient to go in and  out of AFIB, but rate controlled into the 80's-90's.  No history of same.  Troponins are downtrending (likely still from prior STEMI), 66 to 56 today.  Only real question will be about anticoagulation (currently on DAPT).  Feels he can board in the ED, have cards team see him first thing this AM for possible medication adjustment, etc.  If requiring admission, will notify EDP but otherwise will plan to d/c home with new recommendations/med adjustments and close clinic follow-up as per cards team.  Care signed out to morning team-- will follow-up on cardiology recommendations.  Final Clinical Impression(s) / ED Diagnoses Final diagnoses:  Dizziness  Diaphoresis  Chest discomfort    Rx / DC Orders ED Discharge Orders    None       Larene Pickett, PA-C 03/27/20 0623    Larene Pickett, PA-C 03/27/20 5697    Orpah Greek, MD 03/27/20 [phone]

## 2020-03-26 NOTE — ED Triage Notes (Signed)
Pt presents with dizziness, nausea, shob and diaphoresis all day today. Pt had stemi with stents last week. Pt denies pain at this time

## 2020-03-27 ENCOUNTER — Telehealth: Payer: Self-pay | Admitting: *Deleted

## 2020-03-27 ENCOUNTER — Telehealth: Payer: Self-pay | Admitting: Cardiology

## 2020-03-27 DIAGNOSIS — I4891 Unspecified atrial fibrillation: Secondary | ICD-10-CM | POA: Diagnosis not present

## 2020-03-27 LAB — TROPONIN I (HIGH SENSITIVITY): Troponin I (High Sensitivity): 56 ng/L — ABNORMAL HIGH (ref <18)

## 2020-03-27 LAB — SARS CORONAVIRUS 2 BY RT PCR (HOSPITAL ORDER, PERFORMED IN ~~LOC~~ HOSPITAL LAB): SARS Coronavirus 2: NEGATIVE

## 2020-03-27 MED ORDER — APIXABAN 2.5 MG PO TABS
2.5000 mg | ORAL_TABLET | Freq: Two times a day (BID) | ORAL | 2 refills | Status: DC
Start: 2020-03-27 — End: 2020-04-03

## 2020-03-27 MED ORDER — METOPROLOL SUCCINATE ER 25 MG PO TB24
50.0000 mg | ORAL_TABLET | Freq: Once | ORAL | Status: AC
  Administered 2020-03-27: 50 mg via ORAL
  Filled 2020-03-27: qty 2

## 2020-03-27 MED ORDER — CLOPIDOGREL BISULFATE 300 MG PO TABS
300.0000 mg | ORAL_TABLET | Freq: Once | ORAL | Status: AC
  Administered 2020-03-27: 300 mg via ORAL
  Filled 2020-03-27: qty 1

## 2020-03-27 MED ORDER — CLOPIDOGREL BISULFATE 75 MG PO TABS
75.0000 mg | ORAL_TABLET | Freq: Every day | ORAL | 2 refills | Status: DC
Start: 2020-03-27 — End: 2020-04-03

## 2020-03-27 MED ORDER — APIXABAN 2.5 MG PO TABS
2.5000 mg | ORAL_TABLET | Freq: Two times a day (BID) | ORAL | Status: DC
  Administered 2020-03-27: 2.5 mg via ORAL
  Filled 2020-03-27: qty 1

## 2020-03-27 NOTE — Discharge Instructions (Signed)
Stop taking aspirin and Brilinta.  Start taking Plavix 75 mg daily and Eliquis 2.5 mg twice daily as directed by your cardiologist and pharmacist.  You can discuss the new medications with the pharmacist as well.  Follow-up with Dr. Martinique in the office as soon as possible, likely in the next week.  Return to the emergency department if any concerning signs or symptoms develop such as loss of consciousness, fast heart rate, chest pains or shortness of breath.  Information on my medicine - ELIQUIS (apixaban)  This medication education was reviewed with me or my healthcare representative as part of my discharge preparation.  Why was Eliquis prescribed for you? Eliquis was prescribed for you to reduce the risk of a blood clot forming that can cause a stroke if you have a medical condition called atrial fibrillation (a type of irregular heartbeat).  What do You need to know about Eliquis ? Take your Eliquis TWICE DAILY - one tablet in the morning and one tablet in the evening with or without food. If you have difficulty swallowing the tablet whole please discuss with your pharmacist how to take the medication safely.  Take Eliquis exactly as prescribed by your doctor and DO NOT stop taking Eliquis without talking to the doctor who prescribed the medication.  Stopping may increase your risk of developing a stroke.  Refill your prescription before you run out.  After discharge, you should have regular check-up appointments with your healthcare provider that is prescribing your Eliquis.  In the future your dose may need to be changed if your kidney function or weight changes by a significant amount or as you get older.  What do you do if you miss a dose? If you miss a dose, take it as soon as you remember on the same day and resume taking twice daily.  Do not take more than one dose of ELIQUIS at the same time to make up a missed dose.  Important Safety Information A possible side effect of  Eliquis is bleeding. You should call your healthcare provider right away if you experience any of the following: ? Bleeding from an injury or your nose that does not stop. ? Unusual colored urine (red or dark brown) or unusual colored stools (red or black). ? Unusual bruising for unknown reasons. ? A serious fall or if you hit your head (even if there is no bleeding).  Some medicines may interact with Eliquis and might increase your risk of bleeding or clotting while on Eliquis. To help avoid this, consult your healthcare provider or pharmacist prior to using any new prescription or non-prescription medications, including herbals, vitamins, non-steroidal anti-inflammatory drugs (NSAIDs) and supplements.  This website has more information on Eliquis (apixaban): http://www.eliquis.com/eliquis/home

## 2020-03-27 NOTE — ED Notes (Signed)
Patient verbalizes understanding of discharge instructions. Opportunity for questioning and answers were provided. Pt discharged from ED. 

## 2020-03-27 NOTE — Telephone Encounter (Signed)
Currently admitted.

## 2020-03-27 NOTE — Telephone Encounter (Signed)
Pt and wife called to have AVS and discharge Rx reviewed.  RNCM reviewed Rx and instructions until pt and wife understood.  No further RNCM needs identified.

## 2020-03-27 NOTE — Telephone Encounter (Signed)
[facility] follow up [facility] visit per PA [facility] with Coletta Memos on 04/03/20 3:15pm.

## 2020-03-27 NOTE — ED Provider Notes (Signed)
Received patient at signout from [facility].  Refer to provider note for full history and physical examination.  Briefly, patient is an 84 year old male with history of recent STEMI 03/13/2020 presenting for evaluation of intermittent dizziness and "twinges" of chest pain.  In the ED he is found to be in and out of A. fib, new onset.  Overnight cardiology fellow has assessed the patient, plan for morning cardiology team to see the patient and make adjustments.  Plan for likely discharge home with medication adjustments and close follow-up outpatient.  MDM  Spoke with Dr. Renella Cunas, cardiology fellow.  The patient has been seen and assessed by himself and the patient's primary cardiologist Dr. Martinique in the ED.  Plan to discontinue Brilinta and aspirin and start Plavix 75 mg daily and reduced dose Eliquis 2.5 mg twice daily.  He will be seen closely in cardiology follow-up outpatient.  Patient has been rate controlled while in the ED.  Dr. Velva Harman has spoken with the patient's wife, discussed medication changes and she and the patient are both in agreement with medication adjustments and plan for follow-up.  I spoke with pharmacy to discuss patient education.  Patient hemodynamically stable and in no distress.       Renita Papa, PA-C 03/27/20 0809    Orpah Greek, MD 04/02/20 0630

## 2020-03-27 NOTE — Consult Note (Addendum)
Cardiology Consultation:   Patient ID: Tommy Dyer MRN: [id number]; DOB: Aug 05, 1936  Admit date: 03/26/2020 Date of Consult: 03/27/2020  Primary Care Provider: [name], MD [facility] Cardiologist: Peter Martinique, MD  [facility] Electrophysiologist:  None   Patient Profile:   33M with CAD (STEMI 02/1820) s/p PCI OM2, CKD, HLD, HTN and prostate CA who presents with nausea, dizziness, and   History of Present Illness:   Mr. Tommy Dyer reported that he has hx of vertigo and was having intermittent nausea over the past few days in the setting of vertigo like sx. He became concerned when he also had throat burning similar to his prior GERD sx but it lasted slightly longer than normal. He wanted to make sure he was not having anginal CP and presented to the ED. His typical vertigo is the room spinning followed by nausea which is similar to his current sx. Prescribed meds by PCP for vertigo. He denies significant CP similar to his recent STEMI and does not feel like this is any way similar. He incidentally was found to have AF while in the ED, mostly rate controlled, briefly 130s but majority 80-100 HR. No hx of AF prior.   Past Medical History:  Diagnosis Date  . Arthritis   . CAD (coronary artery disease)    a. 02/2020: STEMI with DES to 100% 2nd Mrg stenosis. Residual 80% mid-LAD stenosis with consideration of staged PCI recommended.    . Gastric ulcer   . GERD (gastroesophageal reflux disease)   . History of kidney stones   . Hypercholesterolemia   . Hypertension   . Kidney stones   . Osteoporosis   . Prostate cancer ([place])   . STEMI (ST elevation myocardial infarction) [facility])    Past Surgical History:  Procedure Laterality Date  . colonscopy    . CORONARY/GRAFT ACUTE MI REVASCULARIZATION N/A 03/13/2020   Procedure: CORONARY/GRAFT ACUTE MI REVASCULARIZATION;  Surgeon: Martinique, Peter M, MD;  Location: [place] [facility];  Service: Cardiovascular;  Laterality: N/A;  .  EXTRACORPOREAL SHOCK WAVE LITHOTRIPSY Right 06/21/2018   Procedure: RIGHT EXTRACORPOREAL SHOCK WAVE LITHOTRIPSY (ESWL);  Surgeon: Ardis Hughs, MD;  Location: [facility];  Service: Urology;  Laterality: Right;  . LEFT HEART CATH AND CORONARY ANGIOGRAPHY N/A 03/13/2020   Procedure: LEFT HEART CATH AND CORONARY ANGIOGRAPHY;  Surgeon: Martinique, Peter M, MD;  Location: [place] [facility];  Service: Cardiovascular;  Laterality: N/A;  . PROSTATE BIOPSY    . PROSTATE BIOPSY      Home Medications:  Prior to Admission medications   Medication Sig Start Date End Date Taking? Authorizing Provider  acetaminophen (TYLENOL) 500 MG tablet Take 1,000 mg by mouth every 6 (six) hours as needed for headache (pain).    [provider]  amLODipine (NORVASC) 10 MG tablet Take 10 mg by mouth daily.    [provider]  aspirin EC 81 MG EC tablet Take 1 tablet (81 mg total) by mouth daily. Swallow whole. 03/16/20   Strader, Fransisco Hertz, PA-C  Carboxymethylcellulose Sodium (REFRESH TEARS OP) Place 1 drop into both eyes daily as needed (dry eyes).     [provider]  Famotidine (PEPCID AC PO) Take 1 tablet by mouth daily.    [provider]  fenofibrate 54 MG tablet Take 54 mg by mouth daily.    [provider]  fluticasone (FLONASE) 50 MCG/ACT nasal spray Place 1 spray into both nostrils daily as needed for allergies or rhinitis.  [provider]  Lidocaine-Glycerin (PREPARATION H EX) Place 1 application rectally daily as needed (hemorrhoids/pain/itching).     [provider]  metoprolol succinate (TOPROL-XL) 50 MG 24 hr tablet Take 1 tablet (50 mg total) by mouth daily. Take with or immediately following a meal. 03/16/20   Strader, [place] M, PA-C  nitroGLYCERIN (NITROSTAT) 0.4 MG SL tablet Place 1 tablet (0.4 mg total) under the tongue every 5 (five) minutes x 3 doses as needed for chest pain. 03/15/20   Strader, Fransisco Hertz, PA-C  Polyvinyl Alcohol-Povidone  (REFRESH OP) Apply to eye.    [provider]  sodium chloride (OCEAN) 0.65 % SOLN nasal spray Place 1 spray into both nostrils as needed for congestion.    [provider]  tamsulosin (FLOMAX) 0.4 MG CAPS capsule TAKE 1 CAPSULE EVERY DAY  AFTER  SUPPER Patient taking differently: Take 0.4 mg by mouth See admin instructions. Take one capsule (0.4 mg) by mouth every other day after supper 06/17/15   Tyler Pita, MD  ticagrelor (BRILINTA) 90 MG TABS tablet Take 1 tablet (90 mg total) by mouth 2 (two) times daily. 03/15/20   Erma Heritage, PA-C    Inpatient Medications: Scheduled Meds: . apixaban  2.5 mg Oral BID  . clopidogrel  300 mg Oral Once  . metoprolol succinate  50 mg Oral Once  . sodium chloride flush  3 mL Intravenous Once   Continuous Infusions:  PRN Meds:   Allergies:   No Known Allergies  Social History:   Social History   Socioeconomic History  . Marital status: Married    Spouse name: Not on file  . Number of children: Not on file  . Years of education: Not on file  . Highest education level: Not on file  Occupational History  . Not on file  Tobacco Use  . Smoking status: Former Smoker    Packs/day: 1.00    Years: 20.00    Pack years: 20.00    Quit date: 09/27/2003    Years since quitting: 16.5  . Smokeless tobacco: Never Used  Vaping Use  . Vaping Use: Never used  Substance and Sexual Activity  . Alcohol use: No  . Drug use: No  . Sexual activity: Not on file  Other Topics Concern  . Not on file  Social History Narrative  . Not on file   Social Determinants of Health   Financial Resource Strain:   . Difficulty of Paying Living Expenses:   Food Insecurity:   . Worried About Charity fundraiser in the Last Year:   . Arboriculturist in the Last Year:   Transportation Needs:   . Film/video editor (Medical):   Marland Kitchen Lack of Transportation (Non-Medical):   Physical Activity:   . Days of Exercise per Week:   . Minutes of  Exercise per Session:   Stress:   . Feeling of Stress :   Social Connections:   . Frequency of Communication with Friends and Family:   . Frequency of Social Gatherings with Friends and Family:   . Attends Religious Services:   . Active Member of Clubs or Organizations:   . Attends Archivist Meetings:   Marland Kitchen Marital Status:   Intimate Partner Violence:   . Fear of Current or Ex-Partner:   . Emotionally Abused:   Marland Kitchen Physically Abused:   . Sexually Abused:     Family History:   No premature CAD or SCD Family History  Problem  Relation Age of Onset  . Cancer Mother        unknown  . Diabetes Mother   . High blood pressure Mother   . Diabetes Father   . High blood pressure Father   . Breast cancer Maternal Grandmother        breast  . Colon cancer Neg Hx   . Stomach cancer Neg Hx     ROS:  Review of Systems: [y] = yes, [ ]  = no       General: Weight gain [ ] ; Weight loss [ ] ; Anorexia [ ] ; Fatigue [ ] ; Fever [ ] ; Chills [ ] ; Weakness [ ]     Cardiac: Chest pain/pressure [ ] ; Resting SOB [ ] ; Exertional SOB [ ] ; Orthopnea [ ] ; Pedal Edema [ ] ; Palpitations [ ] ; Syncope [ ] ; Presyncope [ ] ; Paroxysmal nocturnal dyspnea [ ]     Pulmonary: Cough [ ] ; Wheezing [ ] ; Hemoptysis [ ] ; Sputum [ ] ; Snoring [ ]     GI: Vomiting [ ] ; Dysphagia [ ] ; Melena [ ] ; Hematochezia [ ] ; Heartburn [y]; Abdominal pain [ ] ; Constipation [ ] ; Diarrhea [ ] ; BRBPR [ ] ; nausea [y]    GU: Hematuria [ ] ; Dysuria [ ] ; Nocturia [ ]   Vascular: Pain in legs with walking [ ] ; Pain in feet with lying flat [ ] ; Non-healing sores [ ] ; Stroke [ ] ; TIA [ ] ; Slurred speech [ ] ;    Neuro: Headaches [ ] ; Vertigo [y]; Seizures [ ] ; Paresthesias [ ] ;Blurred vision [ ] ; Diplopia [ ] ; Vision changes [ ]     Ortho/Skin: Arthritis [ ] ; Joint pain [ ] ; Muscle pain [ ] ; Joint swelling [ ] ; Back Pain [ ] ; Rash [ ]     Psych: Depression [ ] ; Anxiety [ ]     Heme: Bleeding problems [ ] ; Clotting disorders [ ] ; Anemia [ ]      Endocrine: Diabetes [ ] ; Thyroid dysfunction [ ]    Physical Exam/Data:   Vitals:   03/26/20 2243 03/26/20 2254 03/27/20 0445 03/27/20 0716  BP:  (!) 149/79    Pulse: 69 97 88   Resp: 13 17 13 18   Temp:  (!) 97.5 F (36.4 C)    TempSrc:  Oral    SpO2: 98% 99% 97%   Weight:      Height:        Intake/Output Summary (Last 24 hours) at 03/27/2020 0755 Last data filed at 03/27/2020 0706 Gross per 24 hour  Intake 1000 ml  Output --  Net 1000 ml   Last 3 Weights 03/26/2020 03/24/2020 03/13/2020  Weight (lbs) 200 lb 200 lb 6.4 oz 205 lb  Weight (kg) 90.719 kg 90.901 kg 92.987 kg     Body mass index is 27.12 kg/m.  General:  Well nourished, well developed, in no acute distress HEENT: normal Lymph: no adenopathy Neck: no JVD Endocrine:  No thryomegaly Vascular: No carotid bruits; FA pulses 2+ bilaterally without bruits  Cardiac:  normal S1, S2; RRR; no murmur  Lungs:  clear to auscultation bilaterally, no wheezing, rhonchi or rales  Abd: soft, nontender, no hepatomegaly  Ext: no edema Musculoskeletal:  No deformities, BUE and BLE strength normal and equal Skin: warm and dry  Neuro:  CNs 2-12 intact, no focal abnormalities noted Psych:  Normal affect   EKG:  The EKG was personally reviewed and demonstrates: AF Telemetry:  Telemetry was personally reviewed and demonstrates:  Intermittent AF/NSR  Relevant CV Studies:  TTE Result date: 03/14/20 LVEF  55-60%, LV normal fxn, no WMA, grade I diastolic dysfxn, RV systolic fxn normal, mild-mod AV sclerosis  Cors angio/PCI Result date: 03/13/20  Prox LAD lesion is 40% stenosed.  Mid LAD lesion is 80% stenosed.  OM2 100% stenosed.  Post intervention, there is a 0% residual stenosis.  A drug-eluting stent was successfully placed using a STENT RESOLUTE ONYX [id number].  LV end diastolic pressure is mildly elevated.  Laboratory Data:  High Sensitivity Troponin:   Recent Labs  Lab 03/13/20 1213 03/13/20 1548 03/13/20 1944  03/26/20 2218 03/27/20 0240  TROPONINIHS 14 >27,000* >27,000* 66* 56*     Chemistry Recent Labs  Lab 03/26/20 2218  NA 134*  K 3.5  CL 106  CO2 19*  GLUCOSE 111*  BUN 38*  CREATININE 2.83*  CALCIUM 9.5  GFRNONAA 20*  GFRAA 23*  ANIONGAP 9    No results for input(s): PROT, ALBUMIN, AST, ALT, ALKPHOS, BILITOT in the last 168 hours. Hematology Recent Labs  Lab 03/26/20 2218  WBC 10.2  RBC 4.64  HGB 13.3  HCT 37.8*  MCV 81.5  MCH 28.7  MCHC 35.2  RDW 14.0  PLT 251   BNPNo results for input(s): BNP, PROBNP in the last 168 hours.  DDimer No results for input(s): DDIMER in the last 168 hours.  Radiology/Studies:  DG Chest 2 View  Result Date: 03/26/2020 CLINICAL DATA:  Dizziness and vertigo for 2 days, feels like food is stuck in throat EXAM: CHEST - 2 VIEW COMPARISON:  03/13/2020 FINDINGS: Frontal and lateral views of the chest demonstrate an unremarkable cardiac silhouette. Stable areas of scarring without airspace disease, effusion, or pneumothorax. No acute bony abnormalities. IMPRESSION: 1. Stable exam, no acute process. Electronically Signed   By: Randa Ngo M.D.   On: 03/26/2020 22:42   HEART score (4)   Assessment and Plan:   1. AF Found to be in new onset AF during his evaluation. Will plan to switch from ASA/ticag to plavix/reduced dose eliquis. LD brillinta 7/01 PM so will reload plavix 300 mg PO once 12h post last dose and start eliquis at same time. Discussed med changes with patient and wife who understand reasoning on medication changes. Already on toprol XL 50 mg PO daily, will continue for rate control. Will coordinate OP f/u as he just was seen for his post STEMI care.  - please prescribe eliquis 2.5 mg PO bid (next home dose 07/02 PM) - please prescribe plavix 75 mg PO daily (next home dose 07/03 AM)  2. Vertigo 3. GERD Although troponins elevated they are likely still down trending from 1.5 weeks ago when they were >27,000. Today (66->56).  Patient has missed no doses of DAPT and does not feel like this is anginal equivalent. ECG only notable for intermittent AF but no STE concerning for new infarct. Continue current meds for vertigo previously prescribed.   Discussed case with Dr. Martinique who will see as OP f/u  [facility] will sign off.   Medication Recommendations: change ticag/asa to eliquis 2.5 mg PO bid/plavix 75 mg PO daily Other recommendations (labs, testing, etc):  none Follow up as an outpatient:  Will arrange   For questions or updates, please contact [place] Please consult [email or web address] for contact info under   Signed, Dion Body, MD  03/27/2020 7:55 AM

## 2020-03-27 NOTE — ED Notes (Signed)
Cardiology at bedside.

## 2020-03-31 ENCOUNTER — Encounter: Payer: Self-pay | Admitting: General Practice

## 2020-03-31 DIAGNOSIS — N1831 Chronic kidney disease, stage 3a: Secondary | ICD-10-CM | POA: Diagnosis not present

## 2020-03-31 NOTE — Telephone Encounter (Signed)
Spoke with pt, he reports the diarrhea has gotten better. He feels like the diarrhea is related to the atorvastatin and was given the okay to stop until he is seen in the office on Friday this week.

## 2020-03-31 NOTE — Telephone Encounter (Addendum)
Patient contacted regarding discharge from [place] [facility] on 6/.  Patient understands to follow up with provider J CLEAVER on 04/03/20 at 3:15 at [facility]. Patient understands discharge instructions? yes  Patient understands medications and regiment? yes Patient understands to bring all medications to this visit? yes    Patient had question  About stopping Brilitinta and aspirin . Patient was started on Clopidogrel 75 mg  Daily And Eliquis 2.5 mg  One tablet twice a day  Patient states he has both medication and has started them.  Patient states he has develop diarrhea for  2 day . It stated on Sunday . He had 3 episodes on Monday 03/30/20 -    He used some Pepto bismol  , no blood in stool ,  Some bubbling in stomach  , no nausea - per patient no change in diet.  Patient wanted to know if new medication is the cause of  diarrhea   RN  Informed patient will  Defer to  [facility] Pharmacist and doctor covering Dr Martinique

## 2020-03-31 NOTE — Telephone Encounter (Signed)
error 

## 2020-03-31 NOTE — Telephone Encounter (Signed)
Diarrhea can occur with clopidogrel, but < 1%.  Not sure if switched off Brilinta due to cost/side effects.  Looks like he had problems with diarrhea in past, had wondered if was also caused by atorvastatin.

## 2020-03-31 NOTE — Addendum Note (Signed)
Addended by: Cristopher Estimable on: 03/31/2020 12:00 PM   Modules accepted: Orders

## 2020-04-02 NOTE — Progress Notes (Signed)
Cardiology Clinic Note   Patient Name: Tommy Dyer Date of Encounter: 04/03/2020  Primary Care Provider:  [name], MD Primary Cardiologist:  Peter Martinique, MD  Patient Profile    Tommy Dyer 84 year old male presents today for follow-up of his essential hypertension, STEMI (proximal LAD 40%, mid LAD 80%, second marginal 100% with DES x1, LVEF 55-60%) and for follow-up evaluation of his dizziness. He presented to the emergency department on 03/26/2020  Past Medical History    Past Medical History:  Diagnosis Date  . Arthritis   . CAD (coronary artery disease)    a. 02/2020: STEMI with DES to 100% 2nd Mrg stenosis. Residual 80% mid-LAD stenosis with consideration of staged PCI recommended.    . Gastric ulcer   . GERD (gastroesophageal reflux disease)   . History of kidney stones   . Hypercholesterolemia   . Hypertension   . Kidney stones   . Osteoporosis   . Prostate cancer ([place])   . STEMI (ST elevation myocardial infarction) [facility])    Past Surgical History:  Procedure Laterality Date  . colonscopy    . CORONARY/GRAFT ACUTE MI REVASCULARIZATION N/A 03/13/2020   Procedure: CORONARY/GRAFT ACUTE MI REVASCULARIZATION;  Surgeon: Martinique, Peter M, MD;  Location: [facility];  Service: Cardiovascular;  Laterality: N/A;  . EXTRACORPOREAL SHOCK WAVE LITHOTRIPSY Right 06/21/2018   Procedure: RIGHT EXTRACORPOREAL SHOCK WAVE LITHOTRIPSY (ESWL);  Surgeon: Ardis Hughs, MD;  Location: [facility];  Service: Urology;  Laterality: Right;  . LEFT HEART CATH AND CORONARY ANGIOGRAPHY N/A 03/13/2020   Procedure: LEFT HEART CATH AND CORONARY ANGIOGRAPHY;  Surgeon: Martinique, Peter M, MD;  Location: [place];  Service: Cardiovascular;  Laterality: N/A;  . PROSTATE BIOPSY    . PROSTATE BIOPSY      Allergies  No Known Allergies  History of Present Illness    Mr. Riedinger has a PMH of  essential hypertension, STEMI (proximal LAD 40%, mid LAD 80%, second marginal 100% with DES x1,  LVEF 55-60%), HLD, nephrolithiasis, stage IV CKD, and prostate cancer.  He presented to [facility] 03/13/2020 for evaluation of his chest pain.  He presented to the cardiac Cath Lab and received DES x1.  Recommendation for DAPT x1 year.  Renal function being monitored for possible staged PCI of mid LAD.  Echocardiogram 03/14/2020 showed LVEF 55 to 60%, G1 DD.  Started on metoprolol XL, continue amlodipine, Brilinta, aspirin.  He presented to the clinic 03/24/20 for follow-up evaluation and stated he felt well.  He had started to slowly increase his physical activity.  He had questions about the diet he should be following.  He stated that he was eating a lot of baked fish and baked chicken currently.  He noted that his  atorvastatin may have given him diarrhea.  However, he had a 90-day supply and was not sure that this is what caused his upset stomach.  He wanted  to try the atorvastatin and see if it again made him have diarrhea.  Plan was to transition him to rosuvastatin 20 if he has repeat GI upset.  I  gave him the salty 6 diet sheet, had him continue to increase his physical activity as tolerated, and planned follow-up with Dr. Martinique in 3 months.  He presented to the emergency department on 03/26/2020 with dizziness.  His EKG showed atrial fibrillation with RVR at rate of 131 new onset.  He was seen in the emergency department by Dr. Martinique.  Plavix was started  and Eliquis 2.5 mg twice daily.  He was rate controlled and fluctuating between sinus rhythm and atrial fibrillation.  Also noted to have AKI with serum creatinine of 2.86 with a baseline around 2.5.  Felt this may be related to his GI upset.  He presents to the clinic today for follow-up evaluation and states he feels well.  He is no longer having loose stools.  He has been walking and slowly increasing his physical activity.  He has not noticed any palpitations and states he occasionally does have a short episode of shortness of  breath.  He states that these episodes last only seconds and then dissipate with rest.  He wants to know when he can get back on his riding lawnmower.  I have instructed him to present to cardiac rehab for 2 weeks before slowly beginning yard work.  He presented with his wife today and his new medications apixaban, clopidogrel, and metoprolol were discussed.  All questions were answered.  I will have him continue his low-sodium diet, continue to slowly increase his physical activity and follow-up with Dr. Martinique scheduled.  Today he denies chest pain, shortness of breath, lower extremity edema, fatigue, palpitations, melena, hematuria, hemoptysis, diaphoresis, weakness, presyncope, syncope, orthopnea, and PND.  Home Medications    Prior to Admission medications   Medication Sig Start Date End Date Taking? Authorizing Provider  acetaminophen (TYLENOL) 500 MG tablet Take 1,000 mg by mouth every 6 (six) hours as needed for headache (pain).    [provider]  amLODipine (NORVASC) 10 MG tablet Take 10 mg by mouth daily.    [provider]  apixaban (ELIQUIS) 2.5 MG TABS tablet Take 1 tablet (2.5 mg total) by mouth 2 (two) times daily. 03/27/20   Fawze, Mina A, PA-C  Carboxymethylcellulose Sodium (REFRESH TEARS OP) Place 1 drop into both eyes daily as needed (dry eyes).     [provider]  clopidogrel (PLAVIX) 75 MG tablet Take 1 tablet (75 mg total) by mouth daily. 03/27/20   Fawze, Mina A, PA-C  Famotidine (PEPCID AC PO) Take 1 tablet by mouth daily.    [provider]  fenofibrate 54 MG tablet Take 54 mg by mouth daily.    [provider]  fluticasone (FLONASE) 50 MCG/ACT nasal spray Place 1 spray into both nostrils daily as needed for allergies or rhinitis.     [provider]  Lidocaine-Glycerin (PREPARATION H EX) Place 1 application rectally daily as needed (hemorrhoids/pain/itching).     [provider]  meclizine (ANTIVERT) 25 MG tablet  Take 25 mg by mouth daily as needed for dizziness. 03/25/20   [provider]  metoprolol succinate (TOPROL-XL) 50 MG 24 hr tablet Take 1 tablet (50 mg total) by mouth daily. Take with or immediately following a meal. 03/16/20   Strader, [place] M, PA-C  nitroGLYCERIN (NITROSTAT) 0.4 MG SL tablet Place 1 tablet (0.4 mg total) under the tongue every 5 (five) minutes x 3 doses as needed for chest pain. 03/15/20   Strader, Fransisco Hertz, PA-C  sodium chloride (OCEAN) 0.65 % SOLN nasal spray Place 1 spray into both nostrils as needed for congestion.    [provider]  tamsulosin (FLOMAX) 0.4 MG CAPS capsule TAKE 1 CAPSULE EVERY DAY  AFTER  SUPPER Patient taking differently: Take 0.4 mg by mouth See admin instructions. Take one capsule (0.4 mg) by mouth every other day after supper 06/17/15   Tyler Pita, MD    Family History  Family History  Problem Relation Age of Onset  . Cancer Mother        unknown  . Diabetes Mother   . High blood pressure Mother   . Diabetes Father   . High blood pressure Father   . Breast cancer Maternal Grandmother        breast  . Colon cancer Neg Hx   . Stomach cancer Neg Hx    He indicated that his mother is deceased. He indicated that his father is deceased. He indicated that the status of his maternal grandmother is unknown. He indicated that the status of his neg hx is unknown.  Social History    Social History   Socioeconomic History  . Marital status: Married    Spouse name: Not on file  . Number of children: Not on file  . Years of education: Not on file  . Highest education level: Not on file  Occupational History  . Not on file  Tobacco Use  . Smoking status: Former Smoker    Packs/day: 1.00    Years: 20.00    Pack years: 20.00    Quit date: 09/27/2003    Years since quitting: 16.5  . Smokeless tobacco: Never Used  Vaping Use  . Vaping Use: Never used  Substance and Sexual Activity  . Alcohol use: No  . Drug use: No   . Sexual activity: Not on file  Other Topics Concern  . Not on file  Social History Narrative  . Not on file   Social Determinants of Health   Financial Resource Strain:   . Difficulty of Paying Living Expenses:   Food Insecurity:   . Worried About Charity fundraiser in the Last Year:   . Arboriculturist in the Last Year:   Transportation Needs:   . Film/video editor (Medical):   Marland Kitchen Lack of Transportation (Non-Medical):   Physical Activity:   . Days of Exercise per Week:   . Minutes of Exercise per Session:   Stress:   . Feeling of Stress :   Social Connections:   . Frequency of Communication with Friends and Family:   . Frequency of Social Gatherings with Friends and Family:   . Attends Religious Services:   . Active Member of Clubs or Organizations:   . Attends Archivist Meetings:   Marland Kitchen Marital Status:   Intimate Partner Violence:   . Fear of Current or Ex-Partner:   . Emotionally Abused:   Marland Kitchen Physically Abused:   . Sexually Abused:      Review of Systems    General:  No chills, fever, night sweats or weight changes.  Cardiovascular:  No chest pain, dyspnea on exertion, edema, orthopnea, palpitations, paroxysmal nocturnal dyspnea. Dermatological: No rash, lesions/masses Respiratory: No cough, dyspnea Urologic: No hematuria, dysuria Abdominal:   No nausea, vomiting, diarrhea, bright red blood per rectum, melena, or hematemesis Neurologic:  No visual changes, wkns, changes in mental status. All other systems reviewed and are otherwise negative except as noted above.  Physical Exam    VS:  BP 132/74   Pulse 85   Ht 6' (1.829 m)   Wt 199 lb 9.6 oz (90.5 kg)   SpO2 97%   BMI 27.07 kg/m  , BMI Body mass index is 27.07 kg/m. GEN: Well nourished, well developed, in no acute distress. HEENT: normal. Neck: Supple, no JVD, carotid bruits, or masses. Cardiac: RRR, no murmurs, rubs, or gallops. No clubbing, cyanosis, edema.  Radials/DP/PT 2+ and equal  bilaterally.  Respiratory:  Respirations regular and unlabored, clear to auscultation bilaterally. GI: Soft, nontender, nondistended, BS + x 4. MS: no deformity or atrophy. Skin: warm and dry, no rash. Neuro:  Strength and sensation are intact. Psych: Normal affect.  Accessory Clinical Findings    ECG personally reviewed by me today-normal sinus rhythm nonspecific T wave abnormality 82 bpm- No acute changes  EKG 03/26/2020 Atrial fibrillation borderline left axis deviation low voltage 131 bpm  EKG 03/26/2020 Sinus rhythm with nonspecific intraventricular conduction delay 71 bpm  EKG 03/24/2020 Sinus rhythm anterior infarct undetermined age T wave abnormality consider lateral ischemia 88 bpm  EKG 03/15/2020 Sinus rhythm 88 bpm  Echocardiogram 03/14/2020 IMPRESSIONS    1. Left ventricular ejection fraction, by estimation, is 55 to 60%. The  left ventricle has normal function. The left ventricle has no regional  wall motion abnormalities. Left ventricular diastolic parameters are  consistent with Grade I diastolic  dysfunction (impaired relaxation). Elevated left atrial pressure.  2. Right ventricular systolic function is normal. The right ventricular  size is normal.  3. The mitral valve is normal in structure. No evidence of mitral valve  regurgitation. No evidence of mitral stenosis.  4. The aortic valve is normal in structure. Aortic valve regurgitation is  not visualized. Mild to moderate aortic valve sclerosis/calcification is  present, without any evidence of aortic stenosis.  5. The inferior vena cava is normal in size with greater than 50%  respiratory variability, suggesting right atrial pressure of 3 mmHg.  Cardiac catheterization 03/13/2020  Prox LAD lesion is 40% stenosed.  Mid LAD lesion is 80% stenosed.  2nd Mrg lesion is 100% stenosed.  Post intervention, there is a 0% residual stenosis.  A drug-eluting stent was successfully placed using a STENT  RESOLUTE ONYX [id number].  LV end diastolic pressure is mildly elevated.  1. 2 vessel obstructive CAD -80% mid LAD -100% second OM 2. Elevated LVEDP 23 mm Hg 3. Successful PCI of the second OM with DES x 1  Plan: DAPT for one year. Hydrate. Monitor renal function closely. Depending on recovery of renal function consider staged PCI of the mid LAD. Will assess LV function by Echo.   Diagnostic Dominance: Left  Intervention     Assessment & Plan   1.  New onset atrial fibrillation-EKG today shows normal sinus rhythm nonspecific T wave abnormality 82 bpm.  This patients CHA2DS2-VASc Score and unadjusted Ischemic Stroke Rate (% per year) is equal to 4.8 % stroke rate/year from a score of 4(HTN,MI/PAD/Aortic Plaque, Age) Continue Plavix, Eliquis, metoprolol Avoid triggers caffeine, chocolate, EtOH etc. Maintain p.o. hydration  STEMI-no chest pain today.proximal LAD 40%, mid LAD 80%, second marginal 100% with DES x1, LVEF 55-60%.  Of note was considered for staged PCI however creatinine bumped from 2.26-2.49.  We will continue to monitor Continue Plavix, Eliquis amlodipine, metoprolol succinate, nitroglycerin Heart healthy low-sodium diet-salty 6 given Increase physical activity as tolerated  Essential hypertension-BP today 132/74.  Well-controlled at home. Continue amlodipine, metoprolol Heart healthy low-sodium diet-salty 6 given Increase physical activity as tolerated  Hyperlipidemia-03/14/2020: Cholesterol 190; HDL 26; LDL Cholesterol 140; Triglycerides 120; VLDL 24.  Atorvastatin  caused GI upset. Continue fenofibrate Heart healthy low-sodium high-fiber diet Increase physical activity as tolerated  Disposition: Follow-up with Dr. Martinique as scheduled.  Jossie Ng. Allison Silva NP-C    04/03/2020, 3:23 PM [facility] [place] [address] Office [phone] Fax [phone]

## 2020-04-03 ENCOUNTER — Ambulatory Visit: Payer: Medicare HMO | Admitting: General Practice

## 2020-04-03 ENCOUNTER — Other Ambulatory Visit: Payer: Self-pay

## 2020-04-03 ENCOUNTER — Encounter: Payer: Self-pay | Admitting: General Practice

## 2020-04-03 VITALS — BP 132/74 | HR 85 | Ht 72.0 in | Wt 199.6 lb

## 2020-04-03 DIAGNOSIS — E7800 Pure hypercholesterolemia, unspecified: Secondary | ICD-10-CM

## 2020-04-03 DIAGNOSIS — I10 Essential (primary) hypertension: Secondary | ICD-10-CM | POA: Diagnosis not present

## 2020-04-03 DIAGNOSIS — I2121 ST elevation (STEMI) myocardial infarction involving left circumflex coronary artery: Secondary | ICD-10-CM

## 2020-04-03 DIAGNOSIS — I4891 Unspecified atrial fibrillation: Secondary | ICD-10-CM

## 2020-04-03 MED ORDER — APIXABAN 2.5 MG PO TABS: 2.5000 mg | ORAL_TABLET | Freq: Two times a day (BID) | ORAL | 3 refills | Status: DC

## 2020-04-03 MED ORDER — CLOPIDOGREL BISULFATE 75 MG PO TABS: 75.0000 mg | ORAL_TABLET | Freq: Every day | ORAL | 3 refills | Status: DC

## 2020-04-03 NOTE — Patient Instructions (Signed)
Medication Instructions:  The current medical regimen is effective;  continue present plan and medications as directed. Please refer to the Current Medication list given to you today. *If you need a refill on your cardiac medications before your next appointment, please call your pharmacy*  Special Instructions Please try to avoid these triggers:  Do not use any products that have nicotine or tobacco in them. These include cigarettes, e-cigarettes, and chewing tobacco. If you need help quitting, ask your doctor.  Eat heart-healthy foods. Talk with your doctor about the right eating plan for you.  Exercise regularly as told by your doctor.  Do not drink alcohol, Caffeine or chocolate.  Lose weight if you are overweight.  Do not use drugs, including cannabis   OK TO START CARDIAC REHAB  PLEASE READ AND FOLLOW SALTY 6-ATTACHED  PLEASE INCREASE PHYSICAL ACTIVITY SLOWLY AS TOLERATED  Follow-Up: Your next appointment:  KEEP SCHEDULED APPOINTMENT   In Person with Peter Martinique, MD  At [facility], you and your health needs are our priority.  As part of our continuing mission to provide you with exceptional heart care, we have created designated Provider Care Teams.  These Care Teams include your primary Cardiologist (physician) and Advanced Practice Providers (APPs -  Physician Assistants and Nurse Practitioners) who all work together to provide you with the care you need, when you need it.  We recommend signing up for the patient portal called "MyChart".  Sign up information is provided on this After Visit Summary.  MyChart is used to connect with patients for Virtual Visits (Telemedicine).  Patients are able to view lab/test results, encounter notes, upcoming appointments, etc.  Non-urgent messages can be sent to your provider as well.   To learn more about what you can do with MyChart, go to [email or web address].

## 2020-04-13 ENCOUNTER — Telehealth (HOSPITAL_COMMUNITY): Payer: Self-pay | Admitting: Pharmacist

## 2020-04-13 NOTE — Telephone Encounter (Signed)
Cardiac Rehab Medication Review by a Pharmacist  Does the patient  feel that his/her medications are working for him/her?  Yes.   Has the patient been experiencing any side effects to the medications prescribed?  No, no problems  Does the patient measure his/her own blood pressure or blood glucose at home?  Yes, pt takes BP at home.  7/17: 118/65 7/16: 116/68  7/14 119/72 and 124/76 7/12: 122/68  Does the patient have any problems obtaining medications due to transportation or finances? Eliquis is expensive. Pt reports spending $125/month on prescriptions.    Understanding of regimen: good Understanding of indications: good Potential of compliance: good  Pharmacist Comment:   Patient reports not taking the ocean nasal spray. Takes famotidine as needed for reflux and has only taken 2 doses of meclizine.  Patient also expressed concern because he is losing weight (200lbs today per pt)   Wilson Singer, PharmD PGY1 Pharmacy Resident 04/13/2020 4:04 PM

## 2020-04-20 ENCOUNTER — Telehealth (HOSPITAL_COMMUNITY): Payer: Self-pay | Admitting: Pharmacist

## 2020-04-21 DIAGNOSIS — C61 Malignant neoplasm of prostate: Secondary | ICD-10-CM | POA: Diagnosis not present

## 2020-04-21 DIAGNOSIS — I10 Essential (primary) hypertension: Secondary | ICD-10-CM | POA: Diagnosis not present

## 2020-04-21 DIAGNOSIS — I213 ST elevation (STEMI) myocardial infarction of unspecified site: Secondary | ICD-10-CM | POA: Diagnosis not present

## 2020-04-21 DIAGNOSIS — N189 Chronic kidney disease, unspecified: Secondary | ICD-10-CM | POA: Diagnosis not present

## 2020-04-21 DIAGNOSIS — I2510 Atherosclerotic heart disease of native coronary artery without angina pectoris: Secondary | ICD-10-CM | POA: Diagnosis not present

## 2020-04-21 DIAGNOSIS — N183 Chronic kidney disease, stage 3 unspecified: Secondary | ICD-10-CM | POA: Diagnosis not present

## 2020-04-21 DIAGNOSIS — E7800 Pure hypercholesterolemia, unspecified: Secondary | ICD-10-CM | POA: Diagnosis not present

## 2020-04-22 ENCOUNTER — Encounter (HOSPITAL_COMMUNITY)
Admission: RE | Admit: 2020-04-22 | Discharge: 2020-04-22 | Disposition: A | Discharge status: Home / Self Care | Payer: Medicare HMO | Source: Ambulatory Visit | Attending: Cardiology | Admitting: Cardiology

## 2020-04-22 ENCOUNTER — Telehealth (HOSPITAL_COMMUNITY): Payer: Self-pay | Admitting: *Deleted

## 2020-04-22 ENCOUNTER — Other Ambulatory Visit: Payer: Self-pay

## 2020-04-22 DIAGNOSIS — I2121 ST elevation (STEMI) myocardial infarction involving left circumflex coronary artery: Secondary | ICD-10-CM | POA: Insufficient documentation

## 2020-04-22 NOTE — Telephone Encounter (Signed)
Spoke to Mr Henegar. Confirmed appointment. Completed health history.Barnet Pall, RN,BSN 04/22/2020 4:45 PM

## 2020-04-23 ENCOUNTER — Other Ambulatory Visit: Payer: Self-pay

## 2020-04-23 ENCOUNTER — Encounter (HOSPITAL_COMMUNITY)
Admission: RE | Admit: 2020-04-23 | Discharge: 2020-04-23 | Disposition: A | Discharge status: Home / Self Care | Payer: Medicare HMO | Source: Ambulatory Visit | Attending: Cardiology | Admitting: Cardiology

## 2020-04-23 VITALS — BP 138/60 | HR 90 | Ht 70.25 in | Wt 197.5 lb

## 2020-04-23 DIAGNOSIS — I2121 ST elevation (STEMI) myocardial infarction involving left circumflex coronary artery: Secondary | ICD-10-CM

## 2020-04-23 NOTE — Progress Notes (Signed)
Cardiac Individual Treatment Plan  Patient Details  Name: Tommy Dyer MRN: [id number] Date of Birth: 09-May-1936 Referring Provider:     CARDIAC REHAB PHASE II ORIENTATION from 04/23/2020 in [place]  Referring Provider Martinique, Peter, MD      Initial Encounter Date:    CARDIAC REHAB PHASE II ORIENTATION from 04/23/2020 in [place]  Date 04/23/20      Visit Diagnosis: 03/13/20 STEMI, DES OM2  Patient's Home Medications on Admission:  Current Outpatient Medications:  .  acetaminophen (TYLENOL) 500 MG tablet, Take 1,000 mg by mouth every 6 (six) hours as needed for headache (pain)., Disp: , Rfl:  .  amLODipine (NORVASC) 10 MG tablet, Take 10 mg by mouth daily., Disp: , Rfl:  .  apixaban (ELIQUIS) 2.5 MG TABS tablet, Take 1 tablet (2.5 mg total) by mouth 2 (two) times daily., Disp: 180 tablet, Rfl: 3 .  atorvastatin (LIPITOR) 80 MG tablet, Take 80 mg by mouth daily., Disp: , Rfl:  .  Carboxymethylcellulose Sodium (REFRESH TEARS OP), Place 1 drop into both eyes daily as needed (dry eyes). , Disp: , Rfl:  .  clopidogrel (PLAVIX) 75 MG tablet, Take 1 tablet (75 mg total) by mouth daily., Disp: 90 tablet, Rfl: 3 .  Famotidine (PEPCID AC PO), Take 1 tablet by mouth daily., Disp: , Rfl:  .  fluticasone (FLONASE) 50 MCG/ACT nasal spray, Place 1 spray into both nostrils daily as needed for allergies or rhinitis. , Disp: , Rfl:  .  Lidocaine-Glycerin (PREPARATION H EX), Place 1 application rectally daily as needed (hemorrhoids/pain/itching). , Disp: , Rfl:  .  meclizine (ANTIVERT) 25 MG tablet, Take 25 mg by mouth daily as needed for dizziness., Disp: , Rfl:  .  metoprolol succinate (TOPROL-XL) 50 MG 24 hr tablet, Take 1 tablet (50 mg total) by mouth daily. Take with or immediately following a meal., Disp: 90 tablet, Rfl: 1 .  nitroGLYCERIN (NITROSTAT) 0.4 MG SL tablet, Place 1 tablet (0.4 mg total) under the tongue every 5 (five)  minutes x 3 doses as needed for chest pain., Disp: 25 tablet, Rfl: 2 .  sodium chloride (OCEAN) 0.65 % SOLN nasal spray, Place 1 spray into both nostrils as needed for congestion. , Disp: , Rfl:  .  tamsulosin (FLOMAX) 0.4 MG CAPS capsule, TAKE 1 CAPSULE EVERY DAY  AFTER  SUPPER (Patient taking differently: Take 0.4 mg by mouth See admin instructions. Take one capsule (0.4 mg) by mouth every other day after supper), Disp: 90 capsule, Rfl: 1 .  fenofibrate 54 MG tablet, Take 54 mg by mouth daily. (Patient not taking: Reported on 04/23/2020), Disp: , Rfl:   Past Medical History: Past Medical History:  Diagnosis Date  . Arthritis   . CAD (coronary artery disease)    a. 02/2020: STEMI with DES to 100% 2nd Mrg stenosis. Residual 80% mid-LAD stenosis with consideration of staged PCI recommended.    . Gastric ulcer   . GERD (gastroesophageal reflux disease)   . History of kidney stones   . Hypercholesterolemia   . Hypertension   . Kidney stones   . Osteoporosis   . Prostate cancer ([place])   . STEMI (ST elevation myocardial infarction) ([place])     Tobacco Use: Social History   Tobacco Use  Smoking Status Former Smoker  . Packs/day: 1.00  . Years: 20.00  . Pack years: 20.00  . Quit date: 09/27/2003  . Years since quitting: 16.5  Smokeless Tobacco  Never Used    Labs: Recent Review Flowsheet Data    Labs for ITP Cardiac and Pulmonary Rehab Latest Ref Rng & Units 03/30/2009 03/13/2020 03/13/2020 03/13/2020 03/14/2020   Cholestrol 0 - 200 mg/dL - 198 - - 190   LDLCALC 0 - 99 mg/dL - 149(H) - - 140(H)   HDL >40 mg/dL - 29(L) - - 26(L)   Trlycerides <150 mg/dL - 102 - - 120   Hemoglobin A1c 4.8 - 5.6 % - 5.9(H) 5.9(H) - -   TCO2 22 - 32 mmol/L 24 - - 20(L) -      Capillary Blood Glucose: No results found for: GLUCAP   Exercise Target Goals: Exercise Program Goal: Individual exercise prescription set using results from initial 6 min walk test and THRR while considering  patient's activity  barriers and safety.   Exercise Prescription Goal: Starting with aerobic activity 30 plus minutes a day, 3 days per week for initial exercise prescription. Provide home exercise prescription and guidelines that participant acknowledges understanding prior to discharge.  Activity Barriers & Risk Stratification:  Activity Barriers & Cardiac Risk Stratification - 04/23/20 1218      Activity Barriers & Cardiac Risk Stratification   Activity Barriers Joint Problems;Other (comment)   AFIB/Diziness   Comments Afib/Dizziness    Cardiac Risk Stratification High           6 Minute Walk:  6 Minute Walk    Row Name 04/23/20 0930         6 Minute Walk   Phase Initial     Distance 1216 feet     Walk Time 6 minutes     # of Rest Breaks 0     MPH 2.3     METS 2.27     RPE 9     Perceived Dyspnea  0     VO2 Peak 7.94     Symptoms No     Resting HR 90 bpm     Resting BP 138/60     Resting Oxygen Saturation  98 %     Exercise Oxygen Saturation  during 6 min walk 98 %     Max Ex. HR 99 bpm     Max Ex. BP 146/60     2 Minute Post BP 142/62            Oxygen Initial Assessment:   Oxygen Re-Evaluation:   Oxygen Discharge (Final Oxygen Re-Evaluation):   Initial Exercise Prescription:  Initial Exercise Prescription - 04/23/20 1200      Date of Initial Exercise RX and Referring Provider   Date 04/23/20    Referring Provider Martinique, Peter, MD    Expected Discharge Date 06/19/20      Recumbant Bike   Level 1    RPM 60    Minutes 15    METs 1.8      NuStep   Level 2    SPM 75    Minutes 15    METs 1.8      Prescription Details   Frequency (times per week) 3    Duration Progress to 30 minutes of continuous aerobic without signs/symptoms of physical distress      Intensity   THRR 40-80% of Max Heartrate 56-111    Ratings of Perceived Exertion 11-13    Perceived Dyspnea 0-4      Progression   Progression Continue progressive overload as per policy without  signs/symptoms or physical distress.      Resistance Training  Training Prescription Yes    Weight 3    Reps 10-15           Perform Capillary Blood Glucose checks as needed.  Exercise Prescription Changes:   Exercise Comments:   Exercise Goals and Review:  Exercise Goals    Row Name 04/23/20 1233             Exercise Goals   Increase Physical Activity Yes       Intervention Provide advice, education, support and counseling about physical activity/exercise needs.;Develop an individualized exercise prescription for aerobic and resistive training based on initial evaluation findings, risk stratification, comorbidities and participant's personal goals.       Expected Outcomes Short Term: Attend rehab on a regular basis to increase amount of physical activity.;Long Term: Add in home exercise to make exercise part of routine and to increase amount of physical activity.;Long Term: Exercising regularly at least 3-5 days a week.       Increase Strength and Stamina Yes       Intervention Provide advice, education, support and counseling about physical activity/exercise needs.;Develop an individualized exercise prescription for aerobic and resistive training based on initial evaluation findings, risk stratification, comorbidities and participant's personal goals.       Expected Outcomes Short Term: Increase workloads from initial exercise prescription for resistance, speed, and METs.;Short Term: Perform resistance training exercises routinely during rehab and add in resistance training at home       Able to understand and use rate of perceived exertion (RPE) scale Yes       Intervention Provide education and explanation on how to use RPE scale       Expected Outcomes Short Term: Able to use RPE daily in rehab to express subjective intensity level       Knowledge and understanding of Target Heart Rate Range (THRR) Yes       Intervention Provide education and explanation of THRR including  how the numbers were predicted and where they are located for reference       Expected Outcomes Short Term: Able to state/look up THRR;Long Term: Able to use THRR to govern intensity when exercising independently;Short Term: Able to use daily as guideline for intensity in rehab       Able to check pulse independently Yes       Intervention Provide education and demonstration on how to check pulse in carotid and radial arteries.;Review the importance of being able to check your own pulse for safety during independent exercise       Expected Outcomes Short Term: Able to explain why pulse checking is important during independent exercise;Long Term: Able to check pulse independently and accurately       Understanding of Exercise Prescription Yes       Intervention Provide education, explanation, and written materials on patient's individual exercise prescription       Expected Outcomes Short Term: Able to explain program exercise prescription;Long Term: Able to explain home exercise prescription to exercise independently              Exercise Goals Re-Evaluation :    Discharge Exercise Prescription (Final Exercise Prescription Changes):   Nutrition:  Target Goals: Understanding of nutrition guidelines, daily intake of sodium 1500mg , cholesterol 200mg , calories 30% from fat and 7% or less from saturated fats, daily to have 5 or more servings of fruits and vegetables.  Biometrics:  Pre Biometrics - 04/23/20 0900      Pre Biometrics   Waist Circumference  43 inches    Hip Circumference 42 inches    Waist to Hip Ratio 1.02 %    Triceps Skinfold 20 mm    % Body Fat 30.5 %    Grip Strength 44 kg    Flexibility 0 in    Single Leg Stand 8.31 seconds            Nutrition Therapy Plan and Nutrition Goals:   Nutrition Assessments:   Nutrition Goals Re-Evaluation:   Nutrition Goals Discharge (Final Nutrition Goals Re-Evaluation):   Psychosocial: Target Goals: Acknowledge  presence or absence of significant depression and/or stress, maximize coping skills, provide positive support system. Participant is able to verbalize types and ability to use techniques and skills needed for reducing stress and depression.  Initial Review & Psychosocial Screening:  Initial Psych Review & Screening - 04/23/20 0921      Initial Review   Current issues with None Identified      Family Dynamics   Good Support System? Yes   Raji has his wife for support     Barriers   Psychosocial barriers to participate in program There are no identifiable barriers or psychosocial needs.      Screening Interventions   Interventions Encouraged to exercise           Quality of Life Scores:  Quality of Life - 04/23/20 1254      Quality of Life   Select Quality of Life      Quality of Life Scores   Health/Function Pre 26 %    Socioeconomic Pre 29 %    Psych/Spiritual Pre 27.43 %    Family Pre 27.6 %    GLOBAL Pre 27.09 %          Scores of 19 and below usually indicate a poorer quality of life in these areas.  A difference of  2-3 points is a clinically meaningful difference.  A difference of 2-3 points in the total score of the Quality of Life Index has been associated with significant improvement in overall quality of life, self-image, physical symptoms, and general health in studies assessing change in quality of life.  PHQ-9: Recent Review Flowsheet Data    Depression screen [facility] 2/9 04/23/2020   Decreased Interest 0   Down, Depressed, Hopeless 0   PHQ - 2 Score 0     Interpretation of Total Score  Total Score Depression Severity:  1-4 = Minimal depression, 5-9 = Mild depression, 10-14 = Moderate depression, 15-19 = Moderately severe depression, 20-27 = Severe depression   Psychosocial Evaluation and Intervention:   Psychosocial Re-Evaluation:   Psychosocial Discharge (Final Psychosocial Re-Evaluation):   Vocational Rehabilitation: Provide vocational rehab  assistance to qualifying candidates.   Vocational Rehab Evaluation & Intervention:  Vocational Rehab - 04/23/20 2376      Initial Vocational Rehab Evaluation & Intervention   Assessment shows need for Vocational Rehabilitation No   Felton is retired and does not need vocational rehab at this time          Education: Education Goals: Education classes will be provided on a weekly basis, covering required topics. Participant will state understanding/return demonstration of topics presented.  Learning Barriers/Preferences:  Learning Barriers/Preferences - 04/23/20 1026      Learning Barriers/Preferences   Learning Barriers Sight   wears glasses   Learning Preferences Audio;Pictoral;Skilled Demonstration;Verbal Instruction;Video           Education Topics: Hypertension, Hypertension Reduction -Define heart disease and high blood pressure. Discus how  high blood pressure affects the body and ways to reduce high blood pressure.   Exercise and Your Heart -Discuss why it is important to exercise, the FITT principles of exercise, normal and abnormal responses to exercise, and how to exercise safely.   Angina -Discuss definition of angina, causes of angina, treatment of angina, and how to decrease risk of having angina.   Cardiac Medications -Review what the following cardiac medications are used for, how they affect the body, and side effects that may occur when taking the medications.  Medications include Aspirin, Beta blockers, calcium channel blockers, ACE Inhibitors, angiotensin receptor blockers, diuretics, digoxin, and antihyperlipidemics.   Congestive Heart Failure -Discuss the definition of CHF, how to live with CHF, the signs and symptoms of CHF, and how keep track of weight and sodium intake.   Heart Disease and Intimacy -Discus the effect sexual activity has on the heart, how changes occur during intimacy as we age, and safety during sexual activity.   Smoking  Cessation / COPD -Discuss different methods to quit smoking, the health benefits of quitting smoking, and the definition of COPD.   Nutrition I: Fats -Discuss the types of cholesterol, what cholesterol does to the heart, and how cholesterol levels can be controlled.   Nutrition II: Labels -Discuss the different components of food labels and how to read food label   Heart Parts/Heart Disease and PAD -Discuss the anatomy of the heart, the pathway of blood circulation through the heart, and these are affected by heart disease.   Stress I: Signs and Symptoms -Discuss the causes of stress, how stress may lead to anxiety and depression, and ways to limit stress.   Stress II: Relaxation -Discuss different types of relaxation techniques to limit stress.   Warning Signs of Stroke / TIA -Discuss definition of a stroke, what the signs and symptoms are of a stroke, and how to identify when someone is having stroke.   Knowledge Questionnaire Score:  Knowledge Questionnaire Score - 04/23/20 1024      Knowledge Questionnaire Score   Pre Score 19/24           Core Components/Risk Factors/Patient Goals at Admission:  Personal Goals and Risk Factors at Admission - 04/23/20 1024      Core Components/Risk Factors/Patient Goals on Admission    Weight Management Yes;Weight Loss    Intervention Weight Management: Develop a combined nutrition and exercise program designed to reach desired caloric intake, while maintaining appropriate intake of nutrient and fiber, sodium and fats, and appropriate energy expenditure required for the weight goal.;Weight Management: Provide education and appropriate resources to help participant work on and attain dietary goals.;Weight Management/Obesity: Establish reasonable short term and long term weight goals.    Admit Weight 197 lb 8.5 oz (89.6 kg)    Expected Outcomes Short Term: Continue to assess and modify interventions until short term weight is  achieved;Long Term: Adherence to nutrition and physical activity/exercise program aimed toward attainment of established weight goal;Weight Maintenance: Understanding of the daily nutrition guidelines, which includes 25-35% calories from fat, 7% or less cal from saturated fats, less than 200mg  cholesterol, less than 1.5gm of sodium, & 5 or more servings of fruits and vegetables daily;Weight Loss: Understanding of general recommendations for a balanced deficit meal plan, which promotes 1-2 lb weight loss per week and includes a negative energy balance of [phone] kcal/d;Understanding recommendations for meals to include 15-35% energy as protein, 25-35% energy from fat, 35-60% energy from carbohydrates, less than 200mg  of dietary cholesterol,  20-35 gm of total fiber daily;Understanding of distribution of calorie intake throughout the day with the consumption of 4-5 meals/snacks    Hypertension Yes    Intervention Provide education on lifestyle modifcations including regular physical activity/exercise, weight management, moderate sodium restriction and increased consumption of fresh fruit, vegetables, and low fat dairy, alcohol moderation, and smoking cessation.;Monitor prescription use compliance.    Expected Outcomes Short Term: Continued assessment and intervention until BP is < 140/61mm HG in hypertensive participants. < 130/61mm HG in hypertensive participants with diabetes, heart failure or chronic kidney disease.;Long Term: Maintenance of blood pressure at goal levels.    Lipids Yes    Intervention Provide education and support for participant on nutrition & aerobic/resistive exercise along with prescribed medications to achieve LDL 70mg , HDL >40mg .    Expected Outcomes Short Term: Participant states understanding of desired cholesterol values and is compliant with medications prescribed. Participant is following exercise prescription and nutrition guidelines.;Long Term: Cholesterol controlled with  medications as prescribed, with individualized exercise RX and with personalized nutrition plan. Value goals: LDL < 70mg , HDL > 40 mg.           Core Components/Risk Factors/Patient Goals Review:    Core Components/Risk Factors/Patient Goals at Discharge (Final Review):    ITP Comments:  ITP Comments    Row Name 04/23/20 0920           ITP Comments Dr Fransico Him MD, Medical Director              Comments: Toula Moos attended orientation on 04/23/2020 to review rules and guidelines for program.  Completed 6 minute walk test, Intitial ITP, and exercise prescription.  VSS. Telemetry-Sinus Rhythm.  Asymptomatic. Safety measures and social distancing in place per CDC guidelines.Barnet Pall, RN,BSN 04/23/2020 2:25 PM

## 2020-04-24 DIAGNOSIS — I10 Essential (primary) hypertension: Secondary | ICD-10-CM | POA: Diagnosis not present

## 2020-04-27 ENCOUNTER — Other Ambulatory Visit: Payer: Self-pay

## 2020-04-27 ENCOUNTER — Encounter (HOSPITAL_COMMUNITY)
Admission: RE | Admit: 2020-04-27 | Discharge: 2020-04-27 | Disposition: A | Discharge status: Home / Self Care | Payer: Medicare HMO | Source: Ambulatory Visit | Attending: Cardiology | Admitting: Cardiology

## 2020-04-27 DIAGNOSIS — I2121 ST elevation (STEMI) myocardial infarction involving left circumflex coronary artery: Secondary | ICD-10-CM | POA: Diagnosis not present

## 2020-04-27 NOTE — Progress Notes (Signed)
Daily Session Note  Patient Details  Name: Tommy Dyer MRN: [id number] Date of Birth: April 08, 1936 Referring Provider:     CARDIAC REHAB PHASE II ORIENTATION from 04/23/2020 in [place]  Referring Provider Martinique, Peter, MD      Encounter Date: 04/27/2020  Check In:  Session Check In - 04/27/20 1329      Check-In   Supervising physician immediately available to respond to emergencies [facility] immediately available    Physician(s) Dr Wyline Copas    Location [facility]    Staff Present Barnet Pall, RN, Milus Glazier, MS, EP-C, CCRP;Jessica Hassell Done, MS, ACSM-CEP, Exercise Physiologist;Amarria Andreasen Rollene Rotunda, RN, Deland Pretty, MS, ACSM CEP, Exercise Physiologist    Virtual Visit No    Medication changes reported     No    Fall or balance concerns reported    No    Tobacco Cessation No Change    Current number of cigarettes/nicotine per day     0    Warm-up and Cool-down Performed on first and last piece of equipment    Resistance Training Performed Yes    VAD Patient? No    PAD/SET Patient? No      Pain Assessment   Currently in Pain? No/denies    Pain Score 0-No pain           Capillary Blood Glucose: No results found for this or any previous visit (from the past 24 hour(s)).    Social History   Tobacco Use  Smoking Status Former Smoker  . Packs/day: 1.00  . Years: 20.00  . Pack years: 20.00  . Quit date: 09/27/2003  . Years since quitting: 16.5  Smokeless Tobacco Never Used    Goals Met:  Exercise tolerated well Personal goals reviewed No report of cardiac concerns or symptoms Strength training completed today  Goals Unmet:  Not Applicable  Comments: Pt started cardiac rehab today.  Pt tolerated light exercise without difficulty. VSS, telemetry-NSR, asymptomatic.  Medication list reconciled. Pt denies barriers to medicaiton compliance.  PSYCHOSOCIAL ASSESSMENT:  PHQ-0. Pt exhibits positive coping skills,  hopeful outlook with supportive family. No psychosocial needs identified at this time, no psychosocial interventions necessary.  Pt oriented to exercise equipment and routine. Understanding verbalized.   Dr. Fransico Him is Medical Director for Cardiac Rehab at [facility]

## 2020-04-29 ENCOUNTER — Encounter (HOSPITAL_COMMUNITY)
Admission: RE | Admit: 2020-04-29 | Discharge: 2020-04-29 | Disposition: A | Discharge status: Home / Self Care | Payer: Medicare HMO | Source: Ambulatory Visit | Attending: Cardiology | Admitting: Cardiology

## 2020-04-29 ENCOUNTER — Other Ambulatory Visit: Payer: Self-pay

## 2020-04-29 DIAGNOSIS — I2121 ST elevation (STEMI) myocardial infarction involving left circumflex coronary artery: Secondary | ICD-10-CM | POA: Diagnosis not present

## 2020-05-01 ENCOUNTER — Other Ambulatory Visit: Payer: Self-pay

## 2020-05-01 ENCOUNTER — Encounter (HOSPITAL_COMMUNITY)
Admission: RE | Admit: 2020-05-01 | Discharge: 2020-05-01 | Disposition: A | Discharge status: Home / Self Care | Payer: Medicare HMO | Source: Ambulatory Visit | Attending: Cardiology | Admitting: Cardiology

## 2020-05-01 DIAGNOSIS — I2121 ST elevation (STEMI) myocardial infarction involving left circumflex coronary artery: Secondary | ICD-10-CM

## 2020-05-04 ENCOUNTER — Encounter (HOSPITAL_COMMUNITY)
Admission: RE | Admit: 2020-05-04 | Discharge: 2020-05-04 | Disposition: A | Discharge status: Home / Self Care | Payer: Medicare HMO | Source: Ambulatory Visit | Attending: Cardiology | Admitting: Cardiology

## 2020-05-04 ENCOUNTER — Other Ambulatory Visit: Payer: Self-pay

## 2020-05-04 DIAGNOSIS — I2121 ST elevation (STEMI) myocardial infarction involving left circumflex coronary artery: Secondary | ICD-10-CM | POA: Diagnosis not present

## 2020-05-04 NOTE — Progress Notes (Signed)
Tommy Dyer 84 y.o. male Nutrition Note  Visit Diagnosis: 03/13/20 STEMI, DES OM2  Past Medical History:  Diagnosis Date  . Arthritis   . CAD (coronary artery disease)    a. 02/2020: STEMI with DES to 100% 2nd Mrg stenosis. Residual 80% mid-LAD stenosis with consideration of staged PCI recommended.    . Gastric ulcer   . GERD (gastroesophageal reflux disease)   . History of kidney stones   . Hypercholesterolemia   . Hypertension   . Kidney stones   . Osteoporosis   . Prostate cancer ([place])   . STEMI (ST elevation myocardial infarction) ([place])      Medications reviewed.   Current Outpatient Medications:  .  acetaminophen (TYLENOL) 500 MG tablet, Take 1,000 mg by mouth every 6 (six) hours as needed for headache (pain)., Disp: , Rfl:  .  amLODipine (NORVASC) 10 MG tablet, Take 10 mg by mouth daily., Disp: , Rfl:  .  apixaban (ELIQUIS) 2.5 MG TABS tablet, Take 1 tablet (2.5 mg total) by mouth 2 (two) times daily., Disp: 180 tablet, Rfl: 3 .  atorvastatin (LIPITOR) 80 MG tablet, Take 80 mg by mouth daily., Disp: , Rfl:  .  Carboxymethylcellulose Sodium (REFRESH TEARS OP), Place 1 drop into both eyes daily as needed (dry eyes). , Disp: , Rfl:  .  clopidogrel (PLAVIX) 75 MG tablet, Take 1 tablet (75 mg total) by mouth daily., Disp: 90 tablet, Rfl: 3 .  Famotidine (PEPCID AC PO), Take 1 tablet by mouth daily., Disp: , Rfl:  .  fenofibrate 54 MG tablet, Take 54 mg by mouth daily. (Patient not taking: Reported on 04/23/2020), Disp: , Rfl:  .  fluticasone (FLONASE) 50 MCG/ACT nasal spray, Place 1 spray into both nostrils daily as needed for allergies or rhinitis. , Disp: , Rfl:  .  Lidocaine-Glycerin (PREPARATION H EX), Place 1 application rectally daily as needed (hemorrhoids/pain/itching). , Disp: , Rfl:  .  meclizine (ANTIVERT) 25 MG tablet, Take 25 mg by mouth daily as needed for dizziness., Disp: , Rfl:  .  metoprolol succinate (TOPROL-XL) 50 MG 24 hr tablet, Take 1 tablet (50 mg total) by  mouth daily. Take with or immediately following a meal., Disp: 90 tablet, Rfl: 1 .  nitroGLYCERIN (NITROSTAT) 0.4 MG SL tablet, Place 1 tablet (0.4 mg total) under the tongue every 5 (five) minutes x 3 doses as needed for chest pain., Disp: 25 tablet, Rfl: 2 .  sodium chloride (OCEAN) 0.65 % SOLN nasal spray, Place 1 spray into both nostrils as needed for congestion. , Disp: , Rfl:  .  tamsulosin (FLOMAX) 0.4 MG CAPS capsule, TAKE 1 CAPSULE EVERY DAY  AFTER  SUPPER (Patient taking differently: Take 0.4 mg by mouth See admin instructions. Take one capsule (0.4 mg) by mouth every other day after supper), Disp: 90 capsule, Rfl: 1   Ht Readings from Last 1 Encounters:  04/23/20 5' 10.25" (1.784 m)     Wt Readings from Last 3 Encounters:  04/23/20 197 lb 8.5 oz (89.6 kg)  04/03/20 199 lb 9.6 oz (90.5 kg)  03/26/20 200 lb (90.7 kg)     There is no height or weight on file to calculate BMI.   Social History   Tobacco Use  Smoking Status Former Smoker  . Packs/day: 1.00  . Years: 20.00  . Pack years: 20.00  . Quit date: 09/27/2003  . Years since quitting: 16.6  Smokeless Tobacco Never Used     Lab Results  Component Value Date  CHOL 190 03/14/2020   Lab Results  Component Value Date   HDL 26 (L) 03/14/2020   Lab Results  Component Value Date   LDLCALC 140 (H) 03/14/2020   Lab Results  Component Value Date   TRIG 120 03/14/2020     Lab Results  Component Value Date   HGBA1C 5.9 (H) 03/13/2020     CBG (last 3)  No results for input(s): GLUCAP in the last 72 hours.   Nutrition Note  Spoke with pt. Nutrition Plan and Nutrition Survey goals reviewed with pt. Pt is following a Heart Healthy diet.   Pt has Pre-diabetes. Last A1c indicates blood glucose well-controlled.   Per discussion, pt does not use canned/convenience foods often. Pt does add a little salt to food. Pt does not eat out frequently.   He made significant diet changes after STEMI including no eating  out, no red meat, fiber rich carbs, minimal salt, and his wife is reading labels and cooking most meals at home.  However, he is not enjoying his food anymore and getting tired of some of the heart healthy options he eats daily. Reviewed ways to increase variety and how to eat out healthfully.  Will follow up with label reading.  Pt expressed understanding of the information reviewed.   Nutrition Diagnosis ? Food-and nutrition-related knowledge deficit related to lack of exposure to information as related to diagnosis of: ? CVD ? Pre-diabetes  Nutrition Intervention ? Pt's individual nutrition plan reviewed with pt. ? Benefits of adopting Heart Healthy diet discussed when [employer] reviewed. ? Continue client-centered nutrition education by RD, as part of interdisciplinary care.  Goal(s)  ? Pt to build a healthy plate including vegetables, fruits, whole grains, and low-fat dairy products in a heart healthy meal plan. ? Pt to introduce new foods each week to maintain variety and enjoyment of food ? Pt to learn to read labels  Plan:   Will provide client-centered nutrition education as part of interdisciplinary care  Monitor and evaluate progress toward nutrition goal with team.   Michaele Offer, MS, RDN, LDN

## 2020-05-05 NOTE — Progress Notes (Addendum)
Cardiac Individual Treatment Plan  Patient Details  Name: Tommy Dyer MRN: [id number] Date of Birth: 1935/10/31 Referring Provider:     CARDIAC REHAB PHASE II ORIENTATION from 04/23/2020 in [facility]  Referring Provider Martinique, Peter, MD      Initial Encounter Date:    CARDIAC REHAB PHASE II ORIENTATION from 04/23/2020 in [facility]  Date 04/23/20      Visit Diagnosis: 03/13/20 STEMI, DES OM2  Patient's Home Medications on Admission:  Current Outpatient Medications:  .  acetaminophen (TYLENOL) 500 MG tablet, Take 1,000 mg by mouth every 6 (six) hours as needed for headache (pain)., Disp: , Rfl:  .  amLODipine (NORVASC) 10 MG tablet, Take 10 mg by mouth daily., Disp: , Rfl:  .  apixaban (ELIQUIS) 2.5 MG TABS tablet, Take 1 tablet (2.5 mg total) by mouth 2 (two) times daily., Disp: 180 tablet, Rfl: 3 .  atorvastatin (LIPITOR) 80 MG tablet, Take 80 mg by mouth daily., Disp: , Rfl:  .  Carboxymethylcellulose Sodium (REFRESH TEARS OP), Place 1 drop into both eyes daily as needed (dry eyes). , Disp: , Rfl:  .  clopidogrel (PLAVIX) 75 MG tablet, Take 1 tablet (75 mg total) by mouth daily., Disp: 90 tablet, Rfl: 3 .  Famotidine (PEPCID AC PO), Take 1 tablet by mouth daily., Disp: , Rfl:  .  fenofibrate 54 MG tablet, Take 54 mg by mouth daily. (Patient not taking: Reported on 04/23/2020), Disp: , Rfl:  .  fluticasone (FLONASE) 50 MCG/ACT nasal spray, Place 1 spray into both nostrils daily as needed for allergies or rhinitis. , Disp: , Rfl:  .  Lidocaine-Glycerin (PREPARATION H EX), Place 1 application rectally daily as needed (hemorrhoids/pain/itching). , Disp: , Rfl:  .  meclizine (ANTIVERT) 25 MG tablet, Take 25 mg by mouth daily as needed for dizziness., Disp: , Rfl:  .  metoprolol succinate (TOPROL-XL) 50 MG 24 hr tablet, Take 1 tablet (50 mg total) by mouth daily. Take with or immediately following a meal., Disp: 90 tablet,  Rfl: 1 .  nitroGLYCERIN (NITROSTAT) 0.4 MG SL tablet, Place 1 tablet (0.4 mg total) under the tongue every 5 (five) minutes x 3 doses as needed for chest pain., Disp: 25 tablet, Rfl: 2 .  sodium chloride (OCEAN) 0.65 % SOLN nasal spray, Place 1 spray into both nostrils as needed for congestion. , Disp: , Rfl:  .  tamsulosin (FLOMAX) 0.4 MG CAPS capsule, TAKE 1 CAPSULE EVERY DAY  AFTER  SUPPER (Patient taking differently: Take 0.4 mg by mouth See admin instructions. Take one capsule (0.4 mg) by mouth every other day after supper), Disp: 90 capsule, Rfl: 1  Past Medical History: Past Medical History:  Diagnosis Date  . Arthritis   . CAD (coronary artery disease)    a. 02/2020: STEMI with DES to 100% 2nd Mrg stenosis. Residual 80% mid-LAD stenosis with consideration of staged PCI recommended.    . Gastric ulcer   . GERD (gastroesophageal reflux disease)   . History of kidney stones   . Hypercholesterolemia   . Hypertension   . Kidney stones   . Osteoporosis   . Prostate cancer ([place])   . STEMI (ST elevation myocardial infarction) ([place])     Tobacco Use: Social History   Tobacco Use  Smoking Status Former Smoker  . Packs/day: 1.00  . Years: 20.00  . Pack years: 20.00  . Quit date: 09/27/2003  . Years since quitting: 16.6  Smokeless Tobacco  Never Used    Labs: Recent Review Flowsheet Data    Labs for ITP Cardiac and Pulmonary Rehab Latest Ref Rng & Units 03/30/2009 03/13/2020 03/13/2020 03/13/2020 03/14/2020   Cholestrol 0 - 200 mg/dL - 198 - - 190   LDLCALC 0 - 99 mg/dL - 149(H) - - 140(H)   HDL >40 mg/dL - 29(L) - - 26(L)   Trlycerides <150 mg/dL - 102 - - 120   Hemoglobin A1c 4.8 - 5.6 % - 5.9(H) 5.9(H) - -   TCO2 22 - 32 mmol/L 24 - - 20(L) -      Capillary Blood Glucose: No results found for: GLUCAP   Exercise Target Goals: Exercise Program Goal: Individual exercise prescription set using results from initial 6 min walk test and THRR while considering  patient's activity  barriers and safety.   Exercise Prescription Goal: Initial exercise prescription builds to 30-45 minutes a day of aerobic activity, 2-3 days per week.  Home exercise guidelines will be given to patient during program as part of exercise prescription that the participant will acknowledge.  Activity Barriers & Risk Stratification:  Activity Barriers & Cardiac Risk Stratification - 04/23/20 1218      Activity Barriers & Cardiac Risk Stratification   Activity Barriers Joint Problems;Other (comment)   AFIB/Diziness   Comments Afib/Dizziness    Cardiac Risk Stratification High           6 Minute Walk:  6 Minute Walk    Row Name 04/23/20 0930         6 Minute Walk   Phase Initial     Distance 1216 feet     Walk Time 6 minutes     # of Rest Breaks 0     MPH 2.3     METS 2.27     RPE 9     Perceived Dyspnea  0     VO2 Peak 7.94     Symptoms No     Resting HR 90 bpm     Resting BP 138/60     Resting Oxygen Saturation  98 %     Exercise Oxygen Saturation  during 6 min walk 98 %     Max Ex. HR 99 bpm     Max Ex. BP 146/60     2 Minute Post BP 142/62            Oxygen Initial Assessment:   Oxygen Re-Evaluation:   Oxygen Discharge (Final Oxygen Re-Evaluation):   Initial Exercise Prescription:  Initial Exercise Prescription - 04/23/20 1200      Date of Initial Exercise RX and Referring Provider   Date 04/23/20    Referring Provider Martinique, Peter, MD    Expected Discharge Date 06/19/20      Recumbant Bike   Level 1    RPM 60    Minutes 15    METs 1.8      NuStep   Level 2    SPM 75    Minutes 15    METs 1.8      Prescription Details   Frequency (times per week) 3    Duration Progress to 30 minutes of continuous aerobic without signs/symptoms of physical distress      Intensity   THRR 40-80% of Max Heartrate 56-111    Ratings of Perceived Exertion 11-13    Perceived Dyspnea 0-4      Progression   Progression Continue progressive overload as per  policy without signs/symptoms or physical distress.  Resistance Training   Training Prescription Yes    Weight 3    Reps 10-15           Perform Capillary Blood Glucose checks as needed.  Exercise Prescription Changes:   Exercise Prescription Changes    Row Name 04/27/20 1415 05/04/20 1452           Response to Exercise   Blood Pressure (Admit) 134/62 134/58      Blood Pressure (Exercise) 142/70 132/62      Blood Pressure (Exit) 122/70 126/68      Heart Rate (Admit) 84 bpm 84 bpm      Heart Rate (Exercise) 93 bpm 97 bpm      Heart Rate (Exit) 84 bpm 88 bpm      Rating of Perceived Exertion (Exercise) 11 11      Symptoms None None      Comments Pt's first day of exercise in the CRP2 program. Reviewed METs and Home exercise Rx      Duration Progress to 30 minutes of  aerobic without signs/symptoms of physical distress Progress to 30 minutes of  aerobic without signs/symptoms of physical distress      Intensity THRR unchanged THRR unchanged        Progression   Progression Continue to progress workloads to maintain intensity without signs/symptoms of physical distress. Continue to progress workloads to maintain intensity without signs/symptoms of physical distress.      Average METs 1.9 1.7        Resistance Training   Training Prescription Yes Yes      Weight 3 lbs 3 lbs      Reps 10-15 10-15      Time 10 Minutes 10 Minutes        Interval Training   Interval Training No No        Recumbant Bike   Level 1 --      RPM 60 --      Minutes 5  Pt did not feel comfortable on the bike; back to nustep  --      METs 2 --        NuStep   Level 25 2      SPM 75 75      Minutes 15 15      METs 1.8 1.7        Home Exercise Plan   Plans to continue exercise at -- Home (comment)      Frequency -- Add 2 additional days to program exercise sessions.      Initial Home Exercises Provided -- 05/04/20             Exercise Comments:   Exercise Comments    Row Name  04/27/20 1425 05/04/20 1455         Exercise Comments Pt's first day of exercise in the CRP2 program. Tolerated inital session well. Reviewed METs and Home exercise Rx.             Exercise Goals and Review:   Exercise Goals    Row Name 04/23/20 1233             Exercise Goals   Increase Physical Activity Yes       Intervention Provide advice, education, support and counseling about physical activity/exercise needs.;Develop an individualized exercise prescription for aerobic and resistive training based on initial evaluation findings, risk stratification, comorbidities and participant's personal goals.       Expected Outcomes Short Term: Attend rehab on  a regular basis to increase amount of physical activity.;Long Term: Add in home exercise to make exercise part of routine and to increase amount of physical activity.;Long Term: Exercising regularly at least 3-5 days a week.       Increase Strength and Stamina Yes       Intervention Provide advice, education, support and counseling about physical activity/exercise needs.;Develop an individualized exercise prescription for aerobic and resistive training based on initial evaluation findings, risk stratification, comorbidities and participant's personal goals.       Expected Outcomes Short Term: Increase workloads from initial exercise prescription for resistance, speed, and METs.;Short Term: Perform resistance training exercises routinely during rehab and add in resistance training at home       Able to understand and use rate of perceived exertion (RPE) scale Yes       Intervention Provide education and explanation on how to use RPE scale       Expected Outcomes Short Term: Able to use RPE daily in rehab to express subjective intensity level       Knowledge and understanding of Target Heart Rate Range (THRR) Yes       Intervention Provide education and explanation of THRR including how the numbers were predicted and where they are located  for reference       Expected Outcomes Short Term: Able to state/look up THRR;Long Term: Able to use THRR to govern intensity when exercising independently;Short Term: Able to use daily as guideline for intensity in rehab       Able to check pulse independently Yes       Intervention Provide education and demonstration on how to check pulse in carotid and radial arteries.;Review the importance of being able to check your own pulse for safety during independent exercise       Expected Outcomes Short Term: Able to explain why pulse checking is important during independent exercise;Long Term: Able to check pulse independently and accurately       Understanding of Exercise Prescription Yes       Intervention Provide education, explanation, and written materials on patient's individual exercise prescription       Expected Outcomes Short Term: Able to explain program exercise prescription;Long Term: Able to explain home exercise prescription to exercise independently              Exercise Goals Re-Evaluation :  Exercise Goals Re-Evaluation    Row Name 04/27/20 1430 05/04/20 1455           Exercise Goal Re-Evaluation   Exercise Goals Review Increase Physical Activity;Increase Strength and Stamina;Able to understand and use rate of perceived exertion (RPE) scale;Knowledge and understanding of Target Heart Rate Range (THRR);Understanding of Exercise Prescription Increase Physical Activity;Increase Strength and Stamina;Able to understand and use rate of perceived exertion (RPE) scale;Knowledge and understanding of Target Heart Rate Range (THRR);Understanding of Exercise Prescription      Comments Pt's first day of exercise in the CRP2 program. Pt tolerted session well. However, pt found the recumbent bike uncomfortable and despite attempts to readjust seat, pt placed back on the Nustep.Pt understands exercise RX, THRR, and RPE scale. Reviewed home exercise program. Encouraed patient to try and incorporate  2-3 days of walking at home for 30 minutes.      Expected Outcomes Will continue to monitor pt and progress exercise worklads as tolerated. Pt will walk at home 2 x/wk for 30 minutes total.             Discharge Exercise  Prescription (Final Exercise Prescription Changes):  Exercise Prescription Changes - 05/04/20 1452      Response to Exercise   Blood Pressure (Admit) 134/58    Blood Pressure (Exercise) 132/62    Blood Pressure (Exit) 126/68    Heart Rate (Admit) 84 bpm    Heart Rate (Exercise) 97 bpm    Heart Rate (Exit) 88 bpm    Rating of Perceived Exertion (Exercise) 11    Symptoms None    Comments Reviewed METs and Home exercise Rx    Duration Progress to 30 minutes of  aerobic without signs/symptoms of physical distress    Intensity THRR unchanged      Progression   Progression Continue to progress workloads to maintain intensity without signs/symptoms of physical distress.    Average METs 1.7      Resistance Training   Training Prescription Yes    Weight 3 lbs    Reps 10-15    Time 10 Minutes      Interval Training   Interval Training No      NuStep   Level 2    SPM 75    Minutes 15    METs 1.7      Home Exercise Plan   Plans to continue exercise at Home (comment)    Frequency Add 2 additional days to program exercise sessions.    Initial Home Exercises Provided 05/04/20           Nutrition:  Target Goals: Understanding of nutrition guidelines, daily intake of sodium 1500mg , cholesterol 200mg , calories 30% from fat and 7% or less from saturated fats, daily to have 5 or more servings of fruits and vegetables.  Biometrics:  Pre Biometrics - 04/23/20 0900      Pre Biometrics   Waist Circumference 43 inches    Hip Circumference 42 inches    Waist to Hip Ratio 1.02 %    Triceps Skinfold 20 mm    % Body Fat 30.5 %    Grip Strength 44 kg    Flexibility 0 in    Single Leg Stand 8.31 seconds            Nutrition Therapy Plan and Nutrition  Goals:  Nutrition Therapy & Goals - 05/04/20 1428      Nutrition Therapy   Diet Heart Healthy    Drug/Food Interactions Statins/Certain Fruits      Personal Nutrition Goals   Nutrition Goal Pt to build a healthy plate including vegetables, fruits, whole grains, and low-fat dairy products in a heart healthy meal plan.    Personal Goal #2 Pt to introduce new foods each week to maintain variety and enjoyment of food    Personal Goal #3 Pt to learn to read labels      Intervention Plan   Intervention Nutrition handout(s) given to patient.;Prescribe, educate and counsel regarding individualized specific dietary modifications aiming towards targeted core components such as weight, hypertension, lipid management, diabetes, heart failure and other comorbidities.    Expected Outcomes Short Term Goal: A plan has been developed with personal nutrition goals set during dietitian appointment.;Long Term Goal: Adherence to prescribed nutrition plan.           Nutrition Assessments:  Nutrition Assessments - 05/04/20 1429      MEDFICTS Scores   Pre Score 14           Nutrition Goals Re-Evaluation:  Nutrition Goals Re-Evaluation    [place] Name 05/04/20 1429  Goals   Current Weight 197 lb (89.4 kg)       Nutrition Goal Pt to build a healthy plate including vegetables, fruits, whole grains, and low-fat dairy products in a heart healthy meal plan.         Personal Goal #2 Re-Evaluation   Personal Goal #2 Pt to introduce new foods each week to maintain variety and enjoyment of food         Personal Goal #3 Re-Evaluation   Personal Goal #3 Pt to learn to read labels              Nutrition Goals Re-Evaluation:  Nutrition Goals Re-Evaluation    [place] Name 05/04/20 1429             Goals   Current Weight 197 lb (89.4 kg)       Nutrition Goal Pt to build a healthy plate including vegetables, fruits, whole grains, and low-fat dairy products in a heart healthy meal plan.          Personal Goal #2 Re-Evaluation   Personal Goal #2 Pt to introduce new foods each week to maintain variety and enjoyment of food         Personal Goal #3 Re-Evaluation   Personal Goal #3 Pt to learn to read labels              Nutrition Goals Discharge (Final Nutrition Goals Re-Evaluation):  Nutrition Goals Re-Evaluation - 05/04/20 1429      Goals   Current Weight 197 lb (89.4 kg)    Nutrition Goal Pt to build a healthy plate including vegetables, fruits, whole grains, and low-fat dairy products in a heart healthy meal plan.      Personal Goal #2 Re-Evaluation   Personal Goal #2 Pt to introduce new foods each week to maintain variety and enjoyment of food      Personal Goal #3 Re-Evaluation   Personal Goal #3 Pt to learn to read labels           Psychosocial: Target Goals: Acknowledge presence or absence of significant depression and/or stress, maximize coping skills, provide positive support system. Participant is able to verbalize types and ability to use techniques and skills needed for reducing stress and depression.  Initial Review & Psychosocial Screening:  Initial Psych Review & Screening - 04/23/20 0921      Initial Review   Current issues with None Identified      Family Dynamics   Good Support System? Yes   Garland has his wife for support     Barriers   Psychosocial barriers to participate in program There are no identifiable barriers or psychosocial needs.      Screening Interventions   Interventions Encouraged to exercise           Quality of Life Scores:  Quality of Life - 04/23/20 1254      Quality of Life   Select Quality of Life      Quality of Life Scores   Health/Function Pre 26 %    Socioeconomic Pre 29 %    Psych/Spiritual Pre 27.43 %    Family Pre 27.6 %    GLOBAL Pre 27.09 %          Scores of 19 and below usually indicate a poorer quality of life in these areas.  A difference of  2-3 points is a clinically meaningful  difference.  A difference of 2-3 points in the total score of the Quality of  Life Index has been associated with significant improvement in overall quality of life, self-image, physical symptoms, and general health in studies assessing change in quality of life.  PHQ-9: Recent Review Flowsheet Data    Depression screen [facility] 2/9 04/23/2020   Decreased Interest 0   Down, Depressed, Hopeless 0   PHQ - 2 Score 0     Interpretation of Total Score  Total Score Depression Severity:  1-4 = Minimal depression, 5-9 = Mild depression, 10-14 = Moderate depression, 15-19 = Moderately severe depression, 20-27 = Severe depression   Psychosocial Evaluation and Intervention:  Psychosocial Evaluation - 04/28/20 0730      Psychosocial Evaluation & Interventions   Interventions Encouraged to exercise with the program and follow exercise prescription    Comments Mr. Mosely has no psychosocial barriers to self health management or participation in CR. He presents to his first day of exercise with a positive attitude and outlook. He admits to having a strong support system. He enjoys fishing, yard work, and music and utilizes these hobbies for stress prevention and relief. No interventions needed at this time.    Expected Outcomes Patient will continue to have a positive attitude and outlook.    Continue Psychosocial Services  No Follow up required           Psychosocial Re-Evaluation:  Psychosocial Re-Evaluation    [place] Name 05/05/20 0750             Psychosocial Re-Evaluation   Current issues with None Identified       Comments Mr. Foresta has no psychosocial barriers to self health management or participation in CR. He continues to have a positive attitude and outlook. He admits to having a strong support system. He enjoys fishing, yard work, and music and utilizes these hobbies for stress prevention and relief. No interventions needed at this time.       Expected Outcomes Patient will continue to have a  positive attitude and outlook.       Interventions Encouraged to attend Cardiac Rehabilitation for the exercise       Continue Psychosocial Services  No Follow up required              Psychosocial Discharge (Final Psychosocial Re-Evaluation):  Psychosocial Re-Evaluation - 05/05/20 0750      Psychosocial Re-Evaluation   Current issues with None Identified    Comments Mr. Gisler has no psychosocial barriers to self health management or participation in CR. He continues to have a positive attitude and outlook. He admits to having a strong support system. He enjoys fishing, yard work, and music and utilizes these hobbies for stress prevention and relief. No interventions needed at this time.    Expected Outcomes Patient will continue to have a positive attitude and outlook.    Interventions Encouraged to attend Cardiac Rehabilitation for the exercise    Continue Psychosocial Services  No Follow up required           Vocational Rehabilitation: Provide vocational rehab assistance to qualifying candidates.   Vocational Rehab Evaluation & Intervention:  Vocational Rehab - 04/23/20 3710      Initial Vocational Rehab Evaluation & Intervention   Assessment shows need for Vocational Rehabilitation No   Avelardo is retired and does not need vocational rehab at this time          Education: Education Goals: Education classes will be provided on a weekly basis, covering required topics. Participant will state understanding/return demonstration of topics presented.  Learning  Barriers/Preferences:  Learning Barriers/Preferences - 04/23/20 1026      Learning Barriers/Preferences   Learning Barriers Sight   wears glasses   Learning Preferences Audio;Pictoral;Skilled Demonstration;Verbal Instruction;Video           Education Topics: Count Your Pulse:  -Group instruction provided by verbal instruction, demonstration, patient participation and written materials to support subject.   Instructors address importance of being able to find your pulse and how to count your pulse when at home without a heart monitor.  Patients get hands on experience counting their pulse with staff help and individually.   Heart Attack, Angina, and Risk Factor Modification:  -Group instruction provided by verbal instruction, video, and written materials to support subject.  Instructors address signs and symptoms of angina and heart attacks.    Also discuss risk factors for heart disease and how to make changes to improve heart health risk factors.   Functional Fitness:  -Group instruction provided by verbal instruction, demonstration, patient participation, and written materials to support subject.  Instructors address safety measures for doing things around the house.  Discuss how to get up and down off the floor, how to pick things up properly, how to safely get out of a chair without assistance, and balance training.   Meditation and Mindfulness:  -Group instruction provided by verbal instruction, patient participation, and written materials to support subject.  Instructor addresses importance of mindfulness and meditation practice to help reduce stress and improve awareness.  Instructor also leads participants through a meditation exercise.    Stretching for Flexibility and Mobility:  -Group instruction provided by verbal instruction, patient participation, and written materials to support subject.  Instructors lead participants through series of stretches that are designed to increase flexibility thus improving mobility.  These stretches are additional exercise for major muscle groups that are typically performed during regular warm up and cool down.   Hands Only CPR:  -Group verbal, video, and participation provides a basic overview of AHA guidelines for community CPR. Role-play of emergencies allow participants the opportunity to practice calling for help and chest compression technique with  discussion of AED use.   Hypertension: -Group verbal and written instruction that provides a basic overview of hypertension including the most recent diagnostic guidelines, risk factor reduction with self-care instructions and medication management.    Nutrition I class: Heart Healthy Eating:  -Group instruction provided by PowerPoint slides, verbal discussion, and written materials to support subject matter. The instructor gives an explanation and review of the Therapeutic Lifestyle Changes diet recommendations, which includes a discussion on lipid goals, dietary fat, sodium, fiber, plant stanol/sterol esters, sugar, and the components of a well-balanced, healthy diet.   Nutrition II class: Lifestyle Skills:  -Group instruction provided by PowerPoint slides, verbal discussion, and written materials to support subject matter. The instructor gives an explanation and review of label reading, grocery shopping for heart health, heart healthy recipe modifications, and ways to make healthier choices when eating out.   Diabetes Question & Answer:  -Group instruction provided by PowerPoint slides, verbal discussion, and written materials to support subject matter. The instructor gives an explanation and review of diabetes co-morbidities, pre- and post-prandial blood glucose goals, pre-exercise blood glucose goals, signs, symptoms, and treatment of hypoglycemia and hyperglycemia, and foot care basics.   Diabetes Blitz:  -Group instruction provided by PowerPoint slides, verbal discussion, and written materials to support subject matter. The instructor gives an explanation and review of the physiology behind type 1 and type 2 diabetes, diabetes  medications and rational behind using different medications, pre- and post-prandial blood glucose recommendations and Hemoglobin A1c goals, diabetes diet, and exercise including blood glucose guidelines for exercising safely.    Portion Distortion:  -Group  instruction provided by PowerPoint slides, verbal discussion, written materials, and food models to support subject matter. The instructor gives an explanation of serving size versus portion size, changes in portions sizes over the last 20 years, and what consists of a serving from each food group.   Stress Management:  -Group instruction provided by verbal instruction, video, and written materials to support subject matter.  Instructors review role of stress in heart disease and how to cope with stress positively.     Exercising on Your Own:  -Group instruction provided by verbal instruction, power point, and written materials to support subject.  Instructors discuss benefits of exercise, components of exercise, frequency and intensity of exercise, and end points for exercise.  Also discuss use of nitroglycerin and activating EMS.  Review options of places to exercise outside of rehab.  Review guidelines for sex with heart disease.   Cardiac Drugs I:  -Group instruction provided by verbal instruction and written materials to support subject.  Instructor reviews cardiac drug classes: antiplatelets, anticoagulants, beta blockers, and statins.  Instructor discusses reasons, side effects, and lifestyle considerations for each drug class.   Cardiac Drugs II:  -Group instruction provided by verbal instruction and written materials to support subject.  Instructor reviews cardiac drug classes: angiotensin converting enzyme inhibitors (ACE-I), angiotensin II receptor blockers (ARBs), nitrates, and calcium channel blockers.  Instructor discusses reasons, side effects, and lifestyle considerations for each drug class.   Anatomy and Physiology of the Circulatory System:  Group verbal and written instruction and models provide basic cardiac anatomy and physiology, with the coronary electrical and arterial systems. Review of: AMI, Angina, Valve disease, Heart Failure, Peripheral Artery Disease, Cardiac  Arrhythmia, Pacemakers, and the ICD.   Other Education:  -Group or individual verbal, written, or video instructions that support the educational goals of the cardiac rehab program.   Holiday Eating Survival Tips:  -Group instruction provided by PowerPoint slides, verbal discussion, and written materials to support subject matter. The instructor gives patients tips, tricks, and techniques to help them not only survive but enjoy the holidays despite the onslaught of food that accompanies the holidays.   Knowledge Questionnaire Score:  Knowledge Questionnaire Score - 04/23/20 1024      Knowledge Questionnaire Score   Pre Score 19/24           Core Components/Risk Factors/Patient Goals at Admission:  Personal Goals and Risk Factors at Admission - 04/23/20 1024      Core Components/Risk Factors/Patient Goals on Admission    Weight Management Yes;Weight Loss    Intervention Weight Management: Develop a combined nutrition and exercise program designed to reach desired caloric intake, while maintaining appropriate intake of nutrient and fiber, sodium and fats, and appropriate energy expenditure required for the weight goal.;Weight Management: Provide education and appropriate resources to help participant work on and attain dietary goals.;Weight Management/Obesity: Establish reasonable short term and long term weight goals.    Admit Weight 197 lb 8.5 oz (89.6 kg)    Expected Outcomes Short Term: Continue to assess and modify interventions until short term weight is achieved;Long Term: Adherence to nutrition and physical activity/exercise program aimed toward attainment of established weight goal;Weight Maintenance: Understanding of the daily nutrition guidelines, which includes 25-35% calories from fat, 7% or less cal from saturated fats,  less than 200mg  cholesterol, less than 1.5gm of sodium, & 5 or more servings of fruits and vegetables daily;Weight Loss: Understanding of general  recommendations for a balanced deficit meal plan, which promotes 1-2 lb weight loss per week and includes a negative energy balance of [phone] kcal/d;Understanding recommendations for meals to include 15-35% energy as protein, 25-35% energy from fat, 35-60% energy from carbohydrates, less than 200mg  of dietary cholesterol, 20-35 gm of total fiber daily;Understanding of distribution of calorie intake throughout the day with the consumption of 4-5 meals/snacks    Hypertension Yes    Intervention Provide education on lifestyle modifcations including regular physical activity/exercise, weight management, moderate sodium restriction and increased consumption of fresh fruit, vegetables, and low fat dairy, alcohol moderation, and smoking cessation.;Monitor prescription use compliance.    Expected Outcomes Short Term: Continued assessment and intervention until BP is < 140/40mm HG in hypertensive participants. < 130/39mm HG in hypertensive participants with diabetes, heart failure or chronic kidney disease.;Long Term: Maintenance of blood pressure at goal levels.    Lipids Yes    Intervention Provide education and support for participant on nutrition & aerobic/resistive exercise along with prescribed medications to achieve LDL 70mg , HDL >40mg .    Expected Outcomes Short Term: Participant states understanding of desired cholesterol values and is compliant with medications prescribed. Participant is following exercise prescription and nutrition guidelines.;Long Term: Cholesterol controlled with medications as prescribed, with individualized exercise RX and with personalized nutrition plan. Value goals: LDL < 70mg , HDL > 40 mg.           Core Components/Risk Factors/Patient Goals Review:   Goals and Risk Factor Review    Row Name 04/28/20 0734 05/05/20 0751           Core Components/Risk Factors/Patient Goals Review   Personal Goals Review Weight Management/Obesity;Lipids;Hypertension Weight  Management/Obesity;Lipids;Hypertension      Review Mr. Boshers has multiple CAD risk factors. He is eager to participate in CR for RF modification. His goals are to have the stamina and strength to return to yard work and begin a walking program at [employer]. Mr. Hurrell has multiple CAD risk factors. He is eager to continue to  participate in CR for RF modification. His goals are to have the stamina and strength to return to yard work and begin a walking program at [employer].      Expected Outcomes Mr. Mccutchen will continue to participate in CR for risk factor modifications. Mr. Arps will continue to participate in CR for risk factor modifications.             Core Components/Risk Factors/Patient Goals at Discharge (Final Review):   Goals and Risk Factor Review - 05/05/20 0751      Core Components/Risk Factors/Patient Goals Review   Personal Goals Review Weight Management/Obesity;Lipids;Hypertension    Review Mr. Sansone has multiple CAD risk factors. He is eager to continue to  participate in CR for RF modification. His goals are to have the stamina and strength to return to yard work and begin a walking program at [employer].    Expected Outcomes Mr. Zawadzki will continue to participate in CR for risk factor modifications.           ITP Comments:  ITP Comments    Row Name 04/23/20 0920 04/27/20 1600 05/05/20 0746       ITP Comments Dr Fransico Him MD, Medical Director Mr. Peddy completed his first cardiac rehab exercise session today and tolerated well. Denied cardiac complaints. VSS. Worked  to an RPE of 11-13. Patient eager to continue his participation in CR for CAD risk factor modifications and increase his stamina and strength to be able to "get back to yard work" 30 day ITP review: Mr. Crevier has completed 4 exercise sessions since admission. He continues to tolerated exercise well. Denies complaints and VS remain stable. Workes to an RPE of 11-13 with average mets 1.8-2.1. He is eager to continue  to participate in CR for education and risk factor modification. Goals remain to increase stamina and strength so yard work is easier and to establish a walking program at [employer]. Will continue to monitor progression towards goals over the next 30 days.            Comments: see ITP comments

## 2020-05-06 ENCOUNTER — Encounter (HOSPITAL_COMMUNITY)
Admission: RE | Admit: 2020-05-06 | Discharge: 2020-05-06 | Disposition: A | Discharge status: Home / Self Care | Payer: Medicare HMO | Source: Ambulatory Visit | Attending: Cardiology | Admitting: Cardiology

## 2020-05-06 ENCOUNTER — Other Ambulatory Visit: Payer: Self-pay

## 2020-05-06 DIAGNOSIS — I2121 ST elevation (STEMI) myocardial infarction involving left circumflex coronary artery: Secondary | ICD-10-CM | POA: Diagnosis not present

## 2020-05-06 NOTE — Progress Notes (Signed)
Reviewed home exercise prescription with patient. He is not currently walking at home on off days from the [facility] program. Importance of walking at home discussed and components of safe exercise discussed. Stressed to patient to carry cellphone if walking outdoors. Weather parameters for temperature and humidity reviewed and hydration was encouraged before, during and after exercise. THRR of 56-111. Warm-up and cool-down encouraged as well as walking 2-3x/wk for 30 minutes. Reviewed safe and proper use of NTG and was always encouraged to carry. Reviewed S/S that would require patient to terminate exercise and when to call 911 vs MD. Pt verbalized understanding and was provided copy.   Lesly Rubenstein MS, ACSM-EP-C, CCRP

## 2020-05-08 ENCOUNTER — Telehealth: Payer: Self-pay | Admitting: Cardiology

## 2020-05-08 ENCOUNTER — Encounter (HOSPITAL_COMMUNITY)
Admission: RE | Admit: 2020-05-08 | Discharge: 2020-05-08 | Disposition: A | Discharge status: Home / Self Care | Payer: Medicare HMO | Source: Ambulatory Visit | Attending: Cardiology | Admitting: Cardiology

## 2020-05-08 ENCOUNTER — Telehealth: Payer: Self-pay

## 2020-05-08 ENCOUNTER — Other Ambulatory Visit: Payer: Self-pay

## 2020-05-08 DIAGNOSIS — I2121 ST elevation (STEMI) myocardial infarction involving left circumflex coronary artery: Secondary | ICD-10-CM

## 2020-05-08 NOTE — Telephone Encounter (Signed)
Received a call from [place] at cardiac rehab calling to report patient had a choking episode this past Wednesday afternoon while sitting in chair.Stated he feels bloated.Stated these are same symptoms he had before his coronary stent.Stated he is not going to exercise this afternoon.Appointment scheduled with Dr.Jordan 8/16 at 4:20 pm.Stated she will explain to him the emergency protocol and advise him to go to ED if symptoms worsen.

## 2020-05-08 NOTE — Progress Notes (Signed)
Incomplete Session Note  Patient Details  Name: Tommy Dyer MRN: [id number] Date of Birth: Jan 30, 1936 Referring Provider:     CARDIAC REHAB PHASE II ORIENTATION from 04/23/2020 in [place]  Referring Provider Martinique, Peter, MD      Tommy Dyer did not complete his rehab session.  [name] presents to cardiac rehab c/o intermittent "chocking sensation" 2/10 that first occurred 2 days ago approximately 3 hours after his cardiac rehab exercise session. States he was "sitting around" when this discomfort occurred. Patient is unsure the duration of time of discomfort.  States it is the same discomfort that occurred with his MI 02/2020 just with less intensity. Discomfort has "come and gone" multiple times since Wednesday. It is currently not present. Nothing brings on the discomfort of lessens it. It is self limiting. Denies shortness of breath, N/V during occurences.   Spoke with Elly Modena, LPN with [facility]. Appointment obtained with Dr. Martinique 05/11/20. Patient not allowed to exercise today and instructed to not participate in home exercise until he can be evaluated by cardiology. He is also educated on Nitro use and to go to the ED if symptoms worsen. Patient verbalizes understanding. Will follow up with patient after cardiology appointment.  Jeanae Whitmill E. Rollene Rotunda RN, BSN [place]. [facility]  Cardiac and Pulmonary Rehabilitation Phone: [phone] Fax: [phone]

## 2020-05-08 NOTE — Progress Notes (Signed)
Cardiology Office Note   Date:  05/11/2020   ID:  Tommy Dyer, DOB 04-09-1936, MRN [id number]  PCP:  [name], MD  Cardiologist:   Cohen Boettner Martinique, MD   Chief Complaint  Patient presents with  . Coronary Artery Disease  . Atrial Fibrillation      History of Present Illness: Tommy Dyer is a 84 y.o. male who presents for follow up CAD and atrial fibrillation. He has a PMH ofessential hypertension, STEMI (proximal LAD 40%, mid LAD 80%, second marginal 100% with DES x1, LVEF 55-60%), HLD, nephrolithiasis, stage IV CKD, and prostate cancer.  He [facility] to [facility]/18/2021for evaluation of chest pain. He presented to the cardiac Cath Lab and received DES x1 to the OM which was occluded. Recommendation for DAPT x1 year. He also had an 80% stenosis in the mid LAD and would be a candidate for PCI if he continued to have angina. Medical therapy was optimized.  Echocardiogram 03/14/2020 showed LVEF 55 to 60%, G1 DD. Started on metoprolol XL, continue amlodipine, Plavix, aspirin.  He presented to the emergency department on 03/26/2020 with dizziness.  His EKG showed atrial fibrillation with RVR at rate of 131 new onset.  He was seen in the emergency department.  Plavix was started  In place of Brilinta and Eliquis was started at 2.5 mg twice daily.  He was on Toprol. He was rate controlled and fluctuating between sinus rhythm and atrial fibrillation.  Also noted to have AKI with serum creatinine of 2.86 with a baseline around 2.5.    These symptoms resolved and he did well until this past Friday when he experienced some chest pain. He states it was unlike his heart attack pain. Was worse with certain movements/position. Mentioned this at Cardiac Rehab and this was stopped pending evaluation today. He has had no further chest pain. He does have gas symptoms relieved with Pepcid AC or Mylanta.     Past Medical History:  Diagnosis Date  . Arthritis   . CAD (coronary  artery disease)    a. 02/2020: STEMI with DES to 100% 2nd Mrg stenosis. Residual 80% mid-LAD stenosis with consideration of staged PCI recommended.    . Gastric ulcer   . GERD (gastroesophageal reflux disease)   . History of kidney stones   . Hypercholesterolemia   . Hypertension   . Kidney stones   . Osteoporosis   . Prostate cancer ([place])   . STEMI (ST elevation myocardial infarction) [facility])     Past Surgical History:  Procedure Laterality Date  . colonscopy    . CORONARY/GRAFT ACUTE MI REVASCULARIZATION N/A 03/13/2020   Procedure: CORONARY/GRAFT ACUTE MI REVASCULARIZATION;  Surgeon: Martinique, Daley Mooradian M, MD;  Location: [facility];  Service: Cardiovascular;  Laterality: N/A;  . EXTRACORPOREAL SHOCK WAVE LITHOTRIPSY Right 06/21/2018   Procedure: RIGHT EXTRACORPOREAL SHOCK WAVE LITHOTRIPSY (ESWL);  Surgeon: Ardis Hughs, MD;  Location: [facility];  Service: Urology;  Laterality: Right;  . LEFT HEART CATH AND CORONARY ANGIOGRAPHY N/A 03/13/2020   Procedure: LEFT HEART CATH AND CORONARY ANGIOGRAPHY;  Surgeon: Martinique, Monica Codd M, MD;  Location: [facility];  Service: Cardiovascular;  Laterality: N/A;  . PROSTATE BIOPSY    . PROSTATE BIOPSY       Current Outpatient Medications  Medication Sig Dispense Refill  . acetaminophen (TYLENOL) 500 MG tablet Take 1,000 mg by mouth every 6 (six) hours as needed for headache (pain).    Marland Kitchen amLODipine (NORVASC) 10 MG tablet  Take 10 mg by mouth daily.    Marland Kitchen apixaban (ELIQUIS) 2.5 MG TABS tablet Take 1 tablet (2.5 mg total) by mouth 2 (two) times daily. 180 tablet 3  . atorvastatin (LIPITOR) 80 MG tablet Take 80 mg by mouth daily.    . Carboxymethylcellulose Sodium (REFRESH TEARS OP) Place 1 drop into both eyes daily as needed (dry eyes).     . clopidogrel (PLAVIX) 75 MG tablet Take 1 tablet (75 mg total) by mouth daily. 90 tablet 3  . Famotidine (PEPCID AC PO) Take 1 tablet by mouth daily.    . fluticasone (FLONASE) 50 MCG/ACT nasal spray Place 1  spray into both nostrils daily as needed for allergies or rhinitis.     . Lidocaine-Glycerin (PREPARATION H EX) Place 1 application rectally daily as needed (hemorrhoids/pain/itching).     . meclizine (ANTIVERT) 25 MG tablet Take 25 mg by mouth daily as needed for dizziness.    . metoprolol succinate (TOPROL-XL) 50 MG 24 hr tablet Take 1 tablet (50 mg total) by mouth daily. Take with or immediately following a meal. 90 tablet 1  . nitroGLYCERIN (NITROSTAT) 0.4 MG SL tablet Place 1 tablet (0.4 mg total) under the tongue every 5 (five) minutes x 3 doses as needed for chest pain. 25 tablet 2  . sodium chloride (OCEAN) 0.65 % SOLN nasal spray Place 1 spray into both nostrils as needed for congestion.     . tamsulosin (FLOMAX) 0.4 MG CAPS capsule TAKE 1 CAPSULE EVERY DAY  AFTER  SUPPER (Patient taking differently: Take 0.4 mg by mouth See admin instructions. Take one capsule (0.4 mg) by mouth every other day after supper) 90 capsule 1   No current facility-administered medications for this visit.    Allergies:   Patient has no known allergies.    Social History:  The patient  reports that he quit smoking about 16 years ago. He has a 20.00 pack-year smoking history. He has never used smokeless tobacco. He reports that he does not drink alcohol and does not use drugs.   Family History:  The patient's family history includes Breast cancer in his maternal grandmother; Cancer in his mother; Diabetes in his father and mother; High blood pressure in his father and mother.    ROS:  Please see the history of present illness.   Otherwise, review of systems are positive for none.   All other systems are reviewed and negative.    PHYSICAL EXAM: VS:  BP 115/60   Pulse 77   Ht 6' (1.829 m)   Wt 194 lb 9.6 oz (88.3 kg)   SpO2 97%   BMI 26.39 kg/m  , BMI Body mass index is 26.39 kg/m. GEN: Well nourished, well developed, in no acute distress  HEENT: normal  Neck: no JVD, carotid bruits, or  masses Cardiac: RRR; no murmurs, rubs, or gallops,no edema  Respiratory:  clear to auscultation bilaterally, normal work of breathing GI: soft, nontender, nondistended, + BS MS: no deformity or atrophy  Skin: warm and dry, no rash Neuro:  Strength and sensation are intact Psych: euthymic mood, full affect   EKG:  EKG is ordered today. The ekg ordered today demonstrates NSR rate 77 with normal Ecg. I have personally reviewed and interpreted this study.    Recent Labs: 03/13/2020: ALT 22 03/26/2020: BUN 38; Creatinine, Ser 2.83; Hemoglobin 13.3; Platelets 251; Potassium 3.5; Sodium 134    Lipid Panel    Component Value Date/Time   CHOL 190 03/14/2020 0414  TRIG 120 03/14/2020 0414   HDL 26 (L) 03/14/2020 0414   CHOLHDL 7.3 03/14/2020 0414   VLDL 24 03/14/2020 0414   LDLCALC 140 (H) 03/14/2020 0414      Wt Readings from Last 3 Encounters:  05/11/20 194 lb 9.6 oz (88.3 kg)  04/23/20 197 lb 8.5 oz (89.6 kg)  04/03/20 199 lb 9.6 oz (90.5 kg)      Other studies Reviewed: Additional studies/ records that were reviewed today include:   TTE Result date: 03/14/20 LVEF 55-60%, LV normal fxn, no WMA, grade I diastolic dysfxn, RV systolic fxn normal, mild-mod AV sclerosis  [place] angio/PCI Result date: 03/13/20 CORONARY/GRAFT ACUTE MI REVASCULARIZATION  LEFT HEART CATH AND CORONARY ANGIOGRAPHY  Conclusion    Prox LAD lesion is 40% stenosed.  Mid LAD lesion is 80% stenosed.  2nd Mrg lesion is 100% stenosed.  Post intervention, there is a 0% residual stenosis.  A drug-eluting stent was successfully placed using a STENT RESOLUTE ONYX [id number].  LV end diastolic pressure is mildly elevated.   1. 2 vessel obstructive CAD    -80% mid LAD    -100% second OM 2. Elevated LVEDP 23 mm Hg 3. Successful PCI of the second OM with DES x 1  Plan: DAPT for one year. Hydrate. Monitor renal function closely. Depending on recovery of renal function consider staged PCI of the mid  LAD. Will assess LV function by Echo.      ASSESSMENT AND PLAN:  1.  Atrial fibrillation-New onset in early July. Converted to NSR. No recurrent symptoms.  This patients CHA2DS2-VASc Score and unadjusted Ischemic Stroke Rate (% per year) is equal to 4.8 % stroke rate/year from a score of 4(HTN,MI/PAD/Aortic Plaque, Age) Continue Plavix, Eliquis, metoprolol Avoid triggers caffeine and EtOH etc.  2. CAD s/p lateral STEMI n/18/21-with emergent stenting of the first OM.  LVEF 55-60%.He does have residual disease with 80% mid LAD. This segment of the vessel is quite tortuous. He is not having significant angina. Recent chest pain most c/w musculoskeletal pain. Indication for PCI of the LAD would be refractory angina despite optimal medical therapy. I don't think he has that now. I have recommended he resume Cardiac Rehab. In addition there is risk of contrast nephropathy with repeat PCI.  We will continue to monitor Continue Plavix, Eliquis amlodipine, metoprolol succinate, nitroglycerin Heart healthy low-sodium diet Increase physical activity as tolerated  3. Essential hypertension-BP todayis well controlled.   Continueamlodipine, metoprolol Heart healthy low-sodium diet  4. Hyperlipidemia-03/14/2020: Cholesterol 190; HDL 26; LDL Cholesterol 140; Triglycerides 120; VLDL 24.  on Atorvastatin 80 mg daily.  Was previously on fenofibrate but this was discontinued due to CKD.  Will update chemistries and lipid panel  5. CKD stage IV. GFR 22. Most likely related to HTN. No history of DM. Renal US in 2019 showed medical renal disease. He does have kidney stones but no obstruction. Recommend avoidance of NSAIDs. Recommend evaluation by  Nephrology. Will update creatinine and check serum phosphorus.    Current medicines are reviewed at length with the patient today.  The patient does not have concerns regarding medicines.  The following changes have been made:  no change  Labs/ tests  ordered today include:   Orders Placed This Encounter  Procedures  . Basic metabolic panel  . Phosphorus  . Hepatic function panel  . Lipid panel  . Lipid panel  . Phosphorus  . Ambulatory referral to Nephrology  . EKG 12-Lead     Disposition:  FU with me in 3 months  Signed, Judiann Celia Martinique, MD  05/11/2020 5:17 PM    [facility] [address], [place], [place], [address] Phone [phone], Fax [phone]

## 2020-05-08 NOTE — Telephone Encounter (Signed)
?

## 2020-05-11 ENCOUNTER — Encounter: Payer: Self-pay | Admitting: Cardiology

## 2020-05-11 ENCOUNTER — Ambulatory Visit: Payer: Medicare HMO | Admitting: Cardiology

## 2020-05-11 ENCOUNTER — Encounter (HOSPITAL_COMMUNITY): Payer: Medicare HMO

## 2020-05-11 ENCOUNTER — Other Ambulatory Visit: Payer: Self-pay

## 2020-05-11 VITALS — BP 115/60 | HR 77 | Ht 72.0 in | Wt 194.6 lb

## 2020-05-11 DIAGNOSIS — N184 Chronic kidney disease, stage 4 (severe): Secondary | ICD-10-CM

## 2020-05-11 DIAGNOSIS — E7800 Pure hypercholesterolemia, unspecified: Secondary | ICD-10-CM

## 2020-05-11 DIAGNOSIS — I480 Paroxysmal atrial fibrillation: Secondary | ICD-10-CM | POA: Diagnosis not present

## 2020-05-11 DIAGNOSIS — I25118 Atherosclerotic heart disease of native coronary artery with other forms of angina pectoris: Secondary | ICD-10-CM

## 2020-05-11 DIAGNOSIS — I10 Essential (primary) hypertension: Secondary | ICD-10-CM | POA: Diagnosis not present

## 2020-05-11 NOTE — Patient Instructions (Addendum)
Medication Instructions:  Continue same medications *If you need a refill on your cardiac medications before your next appointment, please call your pharmacy*   Lab Work: Bmet,phosphrous,hepatic panel, lipid panel    Testing/Procedures: Ok to resume Cardiac Rehab   Follow-Up: At [facility], you and your health needs are our priority.  As part of our continuing mission to provide you with exceptional heart care, we have created designated Provider Care Teams.  These Care Teams include your primary Cardiologist (physician) and Advanced Practice Providers (APPs -  Physician Assistants and Nurse Practitioners) who all work together to provide you with the care you need, when you need it.  We recommend signing up for the patient portal called "MyChart".  Sign up information is provided on this After Visit Summary.  MyChart is used to connect with patients for Virtual Visits (Telemedicine).  Patients are able to view lab/test results, encounter notes, upcoming appointments, etc.  Non-urgent messages can be sent to your provider as well.   To learn more about what you can do with MyChart, go to [email or web address].    Your next appointment:  Monday 08/10/20 at 3:00 pm   The format for your next appointment: Office    Provider:  Dr.Jordan

## 2020-05-12 DIAGNOSIS — I480 Paroxysmal atrial fibrillation: Secondary | ICD-10-CM | POA: Diagnosis not present

## 2020-05-12 DIAGNOSIS — I25118 Atherosclerotic heart disease of native coronary artery with other forms of angina pectoris: Secondary | ICD-10-CM | POA: Diagnosis not present

## 2020-05-12 DIAGNOSIS — E7800 Pure hypercholesterolemia, unspecified: Secondary | ICD-10-CM | POA: Diagnosis not present

## 2020-05-12 DIAGNOSIS — I10 Essential (primary) hypertension: Secondary | ICD-10-CM | POA: Diagnosis not present

## 2020-05-12 DIAGNOSIS — N184 Chronic kidney disease, stage 4 (severe): Secondary | ICD-10-CM | POA: Diagnosis not present

## 2020-05-12 DIAGNOSIS — I2121 ST elevation (STEMI) myocardial infarction involving left circumflex coronary artery: Secondary | ICD-10-CM | POA: Diagnosis not present

## 2020-05-12 LAB — LIPID PANEL
Chol/HDL Ratio: 4.6 ratio (ref 0.0–5.0)
Cholesterol, Total: 124 mg/dL (ref 100–199)
HDL: 27 mg/dL — ABNORMAL LOW (ref >39)
LDL Chol Calc (NIH): 79 mg/dL (ref 0–99)
Triglycerides: 91 mg/dL (ref 0–149)
VLDL Cholesterol Cal: 18 mg/dL (ref 5–40)

## 2020-05-12 LAB — HEPATIC FUNCTION PANEL
ALT: 36 IU/L (ref 0–44)
AST: 32 IU/L (ref 0–40)
Albumin: 4.3 g/dL (ref 3.6–4.6)
Alkaline Phosphatase: 58 IU/L (ref 48–121)
Bilirubin Total: 0.3 mg/dL (ref 0.0–1.2)
Bilirubin, Direct: 0.13 mg/dL (ref 0.00–0.40)
Total Protein: 7.4 g/dL (ref 6.0–8.5)

## 2020-05-12 LAB — PHOSPHORUS: Phosphorus: 3.7 mg/dL (ref 2.8–4.1)

## 2020-05-13 ENCOUNTER — Encounter (HOSPITAL_COMMUNITY)
Admission: RE | Admit: 2020-05-13 | Discharge: 2020-05-13 | Disposition: A | Discharge status: Home / Self Care | Payer: Medicare HMO | Source: Ambulatory Visit | Attending: Cardiology | Admitting: Cardiology

## 2020-05-13 ENCOUNTER — Other Ambulatory Visit: Payer: Self-pay

## 2020-05-13 DIAGNOSIS — I2121 ST elevation (STEMI) myocardial infarction involving left circumflex coronary artery: Secondary | ICD-10-CM

## 2020-05-13 NOTE — Progress Notes (Signed)
Nutrition Note  Spoke with patient today about lipid management and heart healthy diet. Reviewed heart healthy diet. Recommended eating pattern of lean protein at every meal or snack, 2 servings fatty fish per week, 3-5 servings non starchy vegetables per day, 2-3 servings fruit per day, 1 oz nuts per day. Provided individualized meal planning and ideas for how to incorporate foods he loves. We reviewed label reading.  Provided handouts and recipes. Collaborated with pt for goal setting. Pt verbalized understanding.   Nutrition Diagnosis   Food-and nutrition-related knowledge deficit related to lack of exposure to information as related to diagnosis of: ? CVD ? Pre-diabetes  Nutrition Intervention   Pt's individual nutrition plan reviewed with pt.  Benefits of adopting Heart Healthy diet discussed when [employer] reviewed.  Continue client-centered nutrition education by RD, as part of interdisciplinary care.  Goal(s)   Pt to build a healthy plate including vegetables, fruits, whole grains, and low-fat dairy products in a heart healthy meal plan.  Pt to introduce new foods each week to maintain variety and enjoyment of food  Pt to learn to read labels  Plan:  Will provide client-centered nutrition education as part of interdisciplinary care  Monitor and evaluate progress toward nutrition goal with team.   Michaele Offer, MS, RDN, LDN

## 2020-05-15 ENCOUNTER — Encounter (HOSPITAL_COMMUNITY)
Admission: RE | Admit: 2020-05-15 | Discharge: 2020-05-15 | Disposition: A | Discharge status: Home / Self Care | Payer: Medicare HMO | Source: Ambulatory Visit | Attending: Cardiology | Admitting: Cardiology

## 2020-05-15 ENCOUNTER — Other Ambulatory Visit: Payer: Self-pay

## 2020-05-15 DIAGNOSIS — I2121 ST elevation (STEMI) myocardial infarction involving left circumflex coronary artery: Secondary | ICD-10-CM | POA: Diagnosis not present

## 2020-05-15 DIAGNOSIS — I25118 Atherosclerotic heart disease of native coronary artery with other forms of angina pectoris: Secondary | ICD-10-CM

## 2020-05-15 DIAGNOSIS — E7800 Pure hypercholesterolemia, unspecified: Secondary | ICD-10-CM

## 2020-05-15 MED ORDER — EZETIMIBE 10 MG PO TABS
10.0000 mg | ORAL_TABLET | Freq: Every day | ORAL | 3 refills | Status: DC
Start: 2020-05-15 — End: 2020-05-18

## 2020-05-18 ENCOUNTER — Encounter (HOSPITAL_COMMUNITY)
Admission: RE | Admit: 2020-05-18 | Discharge: 2020-05-18 | Disposition: A | Discharge status: Home / Self Care | Payer: Medicare HMO | Source: Ambulatory Visit | Attending: Cardiology | Admitting: Cardiology

## 2020-05-18 ENCOUNTER — Other Ambulatory Visit: Payer: Self-pay

## 2020-05-18 DIAGNOSIS — E7800 Pure hypercholesterolemia, unspecified: Secondary | ICD-10-CM | POA: Diagnosis not present

## 2020-05-18 DIAGNOSIS — I2121 ST elevation (STEMI) myocardial infarction involving left circumflex coronary artery: Secondary | ICD-10-CM

## 2020-05-18 DIAGNOSIS — N183 Chronic kidney disease, stage 3 unspecified: Secondary | ICD-10-CM | POA: Diagnosis not present

## 2020-05-18 DIAGNOSIS — N189 Chronic kidney disease, unspecified: Secondary | ICD-10-CM | POA: Diagnosis not present

## 2020-05-18 DIAGNOSIS — I2510 Atherosclerotic heart disease of native coronary artery without angina pectoris: Secondary | ICD-10-CM | POA: Diagnosis not present

## 2020-05-18 DIAGNOSIS — I10 Essential (primary) hypertension: Secondary | ICD-10-CM | POA: Diagnosis not present

## 2020-05-18 DIAGNOSIS — I213 ST elevation (STEMI) myocardial infarction of unspecified site: Secondary | ICD-10-CM | POA: Diagnosis not present

## 2020-05-18 DIAGNOSIS — C61 Malignant neoplasm of prostate: Secondary | ICD-10-CM | POA: Diagnosis not present

## 2020-05-20 ENCOUNTER — Encounter (HOSPITAL_COMMUNITY)
Admission: RE | Admit: 2020-05-20 | Discharge: 2020-05-20 | Disposition: A | Discharge status: Home / Self Care | Payer: Medicare HMO | Source: Ambulatory Visit | Attending: Cardiology | Admitting: Cardiology

## 2020-05-20 ENCOUNTER — Other Ambulatory Visit: Payer: Self-pay

## 2020-05-20 DIAGNOSIS — I2121 ST elevation (STEMI) myocardial infarction involving left circumflex coronary artery: Secondary | ICD-10-CM | POA: Diagnosis not present

## 2020-05-22 ENCOUNTER — Other Ambulatory Visit: Payer: Self-pay

## 2020-05-22 ENCOUNTER — Encounter (HOSPITAL_COMMUNITY)
Admission: RE | Admit: 2020-05-22 | Discharge: 2020-05-22 | Disposition: A | Discharge status: Home / Self Care | Payer: Medicare HMO | Source: Ambulatory Visit | Attending: Cardiology | Admitting: Cardiology

## 2020-05-22 DIAGNOSIS — I2121 ST elevation (STEMI) myocardial infarction involving left circumflex coronary artery: Secondary | ICD-10-CM

## 2020-05-25 ENCOUNTER — Encounter (HOSPITAL_COMMUNITY)
Admission: RE | Admit: 2020-05-25 | Discharge: 2020-05-25 | Disposition: A | Discharge status: Home / Self Care | Payer: Medicare HMO | Source: Ambulatory Visit | Attending: Cardiology | Admitting: Cardiology

## 2020-05-25 ENCOUNTER — Other Ambulatory Visit: Payer: Self-pay

## 2020-05-25 DIAGNOSIS — I2121 ST elevation (STEMI) myocardial infarction involving left circumflex coronary artery: Secondary | ICD-10-CM | POA: Diagnosis not present

## 2020-05-27 ENCOUNTER — Encounter (HOSPITAL_COMMUNITY)
Admission: RE | Admit: 2020-05-27 | Discharge: 2020-05-27 | Disposition: A | Discharge status: Home / Self Care | Payer: Medicare HMO | Source: Ambulatory Visit | Attending: Cardiology | Admitting: Cardiology

## 2020-05-27 ENCOUNTER — Other Ambulatory Visit: Payer: Self-pay

## 2020-05-27 DIAGNOSIS — I2121 ST elevation (STEMI) myocardial infarction involving left circumflex coronary artery: Secondary | ICD-10-CM

## 2020-05-29 ENCOUNTER — Other Ambulatory Visit: Payer: Self-pay

## 2020-05-29 ENCOUNTER — Encounter (HOSPITAL_COMMUNITY)
Admission: RE | Admit: 2020-05-29 | Discharge: 2020-05-29 | Disposition: A | Discharge status: Home / Self Care | Payer: Medicare HMO | Source: Ambulatory Visit | Attending: Cardiology | Admitting: Cardiology

## 2020-05-29 DIAGNOSIS — I2121 ST elevation (STEMI) myocardial infarction involving left circumflex coronary artery: Secondary | ICD-10-CM

## 2020-06-02 NOTE — Progress Notes (Signed)
Cardiac Individual Treatment Plan  Patient Details  Name: Tommy Dyer MRN: [id number] Date of Birth: 02-15-1936 Referring Provider:     CARDIAC REHAB PHASE II ORIENTATION from 04/23/2020 in [facility]  Referring Provider Martinique, Peter, MD      Initial Encounter Date:    CARDIAC REHAB PHASE II ORIENTATION from 04/23/2020 in [facility]  Date 04/23/20      Visit Diagnosis: 03/13/20 STEMI, DES OM2  Patient's Home Medications on Admission:  Current Outpatient Medications:  .  acetaminophen (TYLENOL) 500 MG tablet, Take 1,000 mg by mouth every 6 (six) hours as needed for headache (pain)., Disp: , Rfl:  .  amLODipine (NORVASC) 10 MG tablet, Take 10 mg by mouth daily., Disp: , Rfl:  .  apixaban (ELIQUIS) 2.5 MG TABS tablet, Take 1 tablet (2.5 mg total) by mouth 2 (two) times daily., Disp: 180 tablet, Rfl: 3 .  atorvastatin (LIPITOR) 80 MG tablet, Take 80 mg by mouth daily., Disp: , Rfl:  .  Carboxymethylcellulose Sodium (REFRESH TEARS OP), Place 1 drop into both eyes daily as needed (dry eyes). , Disp: , Rfl:  .  clopidogrel (PLAVIX) 75 MG tablet, Take 1 tablet (75 mg total) by mouth daily., Disp: 90 tablet, Rfl: 3 .  Famotidine (PEPCID AC PO), Take 1 tablet by mouth daily., Disp: , Rfl:  .  fluticasone (FLONASE) 50 MCG/ACT nasal spray, Place 1 spray into both nostrils daily as needed for allergies or rhinitis. , Disp: , Rfl:  .  Lidocaine-Glycerin (PREPARATION H EX), Place 1 application rectally daily as needed (hemorrhoids/pain/itching). , Disp: , Rfl:  .  meclizine (ANTIVERT) 25 MG tablet, Take 25 mg by mouth daily as needed for dizziness., Disp: , Rfl:  .  metoprolol succinate (TOPROL-XL) 50 MG 24 hr tablet, Take 1 tablet (50 mg total) by mouth daily. Take with or immediately following a meal., Disp: 90 tablet, Rfl: 1 .  nitroGLYCERIN (NITROSTAT) 0.4 MG SL tablet, Place 1 tablet (0.4 mg total) under the tongue every 5 (five)  minutes x 3 doses as needed for chest pain., Disp: 25 tablet, Rfl: 2 .  sodium chloride (OCEAN) 0.65 % SOLN nasal spray, Place 1 spray into both nostrils as needed for congestion. , Disp: , Rfl:  .  tamsulosin (FLOMAX) 0.4 MG CAPS capsule, TAKE 1 CAPSULE EVERY DAY  AFTER  SUPPER (Patient taking differently: Take 0.4 mg by mouth See admin instructions. Take one capsule (0.4 mg) by mouth every other day after supper), Disp: 90 capsule, Rfl: 1  Past Medical History: Past Medical History:  Diagnosis Date  . Arthritis   . CAD (coronary artery disease)    a. 02/2020: STEMI with DES to 100% 2nd Mrg stenosis. Residual 80% mid-LAD stenosis with consideration of staged PCI recommended.    . Gastric ulcer   . GERD (gastroesophageal reflux disease)   . History of kidney stones   . Hypercholesterolemia   . Hypertension   . Kidney stones   . Osteoporosis   . Prostate cancer ([place])   . STEMI (ST elevation myocardial infarction) ([place])     Tobacco Use: Social History   Tobacco Use  Smoking Status Former Smoker  . Packs/day: 1.00  . Years: 20.00  . Pack years: 20.00  . Quit date: 09/27/2003  . Years since quitting: 16.6  Smokeless Tobacco Never Used    Labs: Recent Review [facility] for [facility]hab Latest Ref  Rng & Units 03/13/2020 03/13/2020 03/13/2020 03/14/2020 05/12/2020   Cholestrol 100 - 199 mg/dL 198 - - 190 124   LDLCALC 0 - 99 mg/dL 149(H) - - 140(H) 79   HDL >39 mg/dL 29(L) - - 26(L) 27(L)   Trlycerides 0 - 149 mg/dL 102 - - 120 91   Hemoglobin A1c 4.8 - 5.6 % 5.9(H) 5.9(H) - - -   TCO2 22 - 32 mmol/L - - 20(L) - -      Capillary Blood Glucose: No results found for: GLUCAP   Exercise Target Goals: Exercise Program Goal: Individual exercise prescription set using results from initial 6 min walk test and THRR while considering  patient's activity barriers and safety.   Exercise Prescription Goal: Initial exercise prescription builds to 30-45  minutes a day of aerobic activity, 2-3 days per week.  Home exercise guidelines will be given to patient during program as part of exercise prescription that the participant will acknowledge.  Activity Barriers & Risk Stratification:  Activity Barriers & Cardiac Risk Stratification - 04/23/20 1218      Activity Barriers & Cardiac Risk Stratification   Activity Barriers Joint Problems;Other (comment)   AFIB/Diziness   Comments Afib/Dizziness    Cardiac Risk Stratification High           6 Minute Walk:  6 Minute Walk    Row Name 04/23/20 0930         6 Minute Walk   Phase Initial     Distance 1216 feet     Walk Time 6 minutes     # of Rest Breaks 0     MPH 2.3     METS 2.27     RPE 9     Perceived Dyspnea  0     VO2 Peak 7.94     Symptoms No     Resting HR 90 bpm     Resting BP 138/60     Resting Oxygen Saturation  98 %     Exercise Oxygen Saturation  during 6 min walk 98 %     Max Ex. HR 99 bpm     Max Ex. BP 146/60     2 Minute Post BP 142/62            Oxygen Initial Assessment:   Oxygen Re-Evaluation:   Oxygen Discharge (Final Oxygen Re-Evaluation):   Initial Exercise Prescription:  Initial Exercise Prescription - 04/23/20 1200      Date of Initial Exercise RX and Referring Provider   Date 04/23/20    Referring Provider Martinique, Peter, MD    Expected Discharge Date 06/19/20      Recumbant Bike   Level 1    RPM 60    Minutes 15    METs 1.8      NuStep   Level 2    SPM 75    Minutes 15    METs 1.8      Prescription Details   Frequency (times per week) 3    Duration Progress to 30 minutes of continuous aerobic without signs/symptoms of physical distress      Intensity   THRR 40-80% of Max Heartrate 56-111    Ratings of Perceived Exertion 11-13    Perceived Dyspnea 0-4      Progression   Progression Continue progressive overload as per policy without signs/symptoms or physical distress.      Resistance Training   Training Prescription  Yes    Weight 3    Reps  10-15           Perform Capillary Blood Glucose checks as needed.  Exercise Prescription Changes:   Exercise Prescription Changes    Row Name 04/27/20 1415 05/04/20 1452 05/20/20 1600 05/27/20 1400 05/28/20 1200     Response to Exercise   Blood Pressure (Admit) 134/62 134/58 124/60 120/64 --   Blood Pressure (Exercise) 142/70 132/62 126/68 140/70 --   Blood Pressure (Exit) 122/70 126/68 120/62 124/68 --   Heart Rate (Admit) 84 bpm 84 bpm 87 bpm 84 bpm --   Heart Rate (Exercise) 93 bpm 97 bpm 89 bpm 92 bpm --   Heart Rate (Exit) 84 bpm 88 bpm 82 bpm 83 bpm --   Rating of Perceived Exertion (Exercise) 11 11 11 11  --   Symptoms None None None None --   Comments Pt's first day of exercise in the CRP2 program. Reviewed METs and Home exercise Rx Reviewed Mets  Reviewed METs and Goals --   Duration Progress to 30 minutes of  aerobic without signs/symptoms of physical distress Progress to 30 minutes of  aerobic without signs/symptoms of physical distress Progress to 30 minutes of  aerobic without signs/symptoms of physical distress Progress to 30 minutes of  aerobic without signs/symptoms of physical distress --   Intensity THRR unchanged THRR unchanged THRR unchanged THRR unchanged --     Progression   Progression Continue to progress workloads to maintain intensity without signs/symptoms of physical distress. Continue to progress workloads to maintain intensity without signs/symptoms of physical distress. Continue to progress workloads to maintain intensity without signs/symptoms of physical distress. Continue to progress workloads to maintain intensity without signs/symptoms of physical distress. --   Average METs 1.9 1.7 1.8 2.2 --     Resistance Training   Training Prescription Yes Yes No No --   Weight 3 lbs 3 lbs --  Pt uses 3 lb wts M, F --  Weights not done on Wednesday --  No weights done on wednesday.    Reps 10-15 10-15 -- -- --   Time 10 Minutes 10  Minutes -- -- --     Interval Training   Interval Training No No No No --     Recumbant Bike   Level 1 -- -- -- --   RPM 60 -- -- -- --   Minutes 5  Pt did not feel comfortable on the bike; back to nustep  -- -- -- --   METs 2 -- -- -- --     NuStep   Level 25 2 2 3  --   SPM 75 75 80 80 --   Minutes 15 15 30 30  --   METs 1.8 1.7 1.8 2.2 --     Home Exercise Plan   Plans to continue exercise at -- Home (comment) -- Home (comment) --   Frequency -- Add 2 additional days to program exercise sessions. -- Add 2 additional days to program exercise sessions. --   Initial Home Exercises Provided -- 05/04/20 05/04/20 05/04/20 --          Exercise Comments:   Exercise Comments    Row Name 04/27/20 1425 05/04/20 1455 05/27/20 1355       Exercise Comments Pt's first day of exercise in the CRP2 program. Tolerated inital session well. Reviewed METs and Home exercise Rx. Reviewed Mets and goals today with patient. Pt is progressing slowing and has incorporated walking at home 2x/wk for 30 minutes.  Exercise Goals and Review:   Exercise Goals    Row Name 04/23/20 1233             Exercise Goals   Increase Physical Activity Yes       Intervention Provide advice, education, support and counseling about physical activity/exercise needs.;Develop an individualized exercise prescription for aerobic and resistive training based on initial evaluation findings, risk stratification, comorbidities and participant's personal goals.       Expected Outcomes Short Term: Attend rehab on a regular basis to increase amount of physical activity.;Long Term: Add in home exercise to make exercise part of routine and to increase amount of physical activity.;Long Term: Exercising regularly at least 3-5 days a week.       Increase Strength and Stamina Yes       Intervention Provide advice, education, support and counseling about physical activity/exercise needs.;Develop an individualized  exercise prescription for aerobic and resistive training based on initial evaluation findings, risk stratification, comorbidities and participant's personal goals.       Expected Outcomes Short Term: Increase workloads from initial exercise prescription for resistance, speed, and METs.;Short Term: Perform resistance training exercises routinely during rehab and add in resistance training at home       Able to understand and use rate of perceived exertion (RPE) scale Yes       Intervention Provide education and explanation on how to use RPE scale       Expected Outcomes Short Term: Able to use RPE daily in rehab to express subjective intensity level       Knowledge and understanding of Target Heart Rate Range (THRR) Yes       Intervention Provide education and explanation of THRR including how the numbers were predicted and where they are located for reference       Expected Outcomes Short Term: Able to state/look up THRR;Long Term: Able to use THRR to govern intensity when exercising independently;Short Term: Able to use daily as guideline for intensity in rehab       Able to check pulse independently Yes       Intervention Provide education and demonstration on how to check pulse in carotid and radial arteries.;Review the importance of being able to check your own pulse for safety during independent exercise       Expected Outcomes Short Term: Able to explain why pulse checking is important during independent exercise;Long Term: Able to check pulse independently and accurately       Understanding of Exercise Prescription Yes       Intervention Provide education, explanation, and written materials on patient's individual exercise prescription       Expected Outcomes Short Term: Able to explain program exercise prescription;Long Term: Able to explain home exercise prescription to exercise independently              Exercise Goals Re-Evaluation :  Exercise Goals Re-Evaluation    Row Name 04/27/20  1430 05/04/20 1455 05/28/20 1253         Exercise Goal Re-Evaluation   Exercise Goals Review Increase Physical Activity;Increase Strength and Stamina;Able to understand and use rate of perceived exertion (RPE) scale;Knowledge and understanding of Target Heart Rate Range (THRR);Understanding of Exercise Prescription Increase Physical Activity;Increase Strength and Stamina;Able to understand and use rate of perceived exertion (RPE) scale;Knowledge and understanding of Target Heart Rate Range (THRR);Understanding of Exercise Prescription Increase Physical Activity;Increase Strength and Stamina;Able to understand and use rate of perceived exertion (RPE) scale;Knowledge and understanding of Target Heart  Rate Range (THRR);Able to check pulse independently;Understanding of Exercise Prescription     Comments Pt's first day of exercise in the CRP2 program. Pt tolerted session well. However, pt found the recumbent bike uncomfortable and despite attempts to readjust seat, pt placed back on the Nustep.Pt understands exercise RX, THRR, and RPE scale. Reviewed home exercise program. Encouraed patient to try and incorporate 2-3 days of walking at home for 30 minutes. Reviewed METs and goals. Pt is making slow progress. Pt is walking 2 x/wk for at least 30 minutes.     Expected Outcomes Will continue to monitor pt and progress exercise worklads as tolerated. Pt will walk at home 2 x/wk for 30 minutes total. Will continue to progress patient s as tolerated and pt will continue to walk at home 2x/wk.            Discharge Exercise Prescription (Final Exercise Prescription Changes):  Exercise Prescription Changes - 05/28/20 1200      Resistance Training   Weight --   No weights done on wednesday.           Nutrition:  Target Goals: Understanding of nutrition guidelines, daily intake of sodium 1500mg , cholesterol 200mg , calories 30% from fat and 7% or less from saturated fats, daily to have 5 or more servings  of fruits and vegetables.  Biometrics:  Pre Biometrics - 04/23/20 0900      Pre Biometrics   Waist Circumference 43 inches    Hip Circumference 42 inches    Waist to Hip Ratio 1.02 %    Triceps Skinfold 20 mm    % Body Fat 30.5 %    Grip Strength 44 kg    Flexibility 0 in    Single Leg Stand 8.31 seconds            Nutrition Therapy Plan and Nutrition Goals:  Nutrition Therapy & Goals - 05/04/20 1428      Nutrition Therapy   Diet Heart Healthy    Drug/Food Interactions Statins/Certain Fruits      Personal Nutrition Goals   Nutrition Goal Pt to build a healthy plate including vegetables, fruits, whole grains, and low-fat dairy products in a heart healthy meal plan.    Personal Goal #2 Pt to introduce new foods each week to maintain variety and enjoyment of food    Personal Goal #3 Pt to learn to read labels      Intervention Plan   Intervention Nutrition handout(s) given to patient.;Prescribe, educate and counsel regarding individualized specific dietary modifications aiming towards targeted core components such as weight, hypertension, lipid management, diabetes, heart failure and other comorbidities.    Expected Outcomes Short Term Goal: A plan has been developed with personal nutrition goals set during dietitian appointment.;Long Term Goal: Adherence to prescribed nutrition plan.           Nutrition Assessments:  Nutrition Assessments - 05/04/20 1429      MEDFICTS Scores   Pre Score 14           Nutrition Goals Re-Evaluation:  Nutrition Goals Re-Evaluation    [place] Name 05/04/20 1429 06/04/20 0729           Goals   Current Weight 197 lb (89.4 kg) 194 lb (88 kg)      Nutrition Goal Pt to build a healthy plate including vegetables, fruits, whole grains, and low-fat dairy products in a heart healthy meal plan. Pt to build a healthy plate including vegetables, fruits, whole grains, and low-fat dairy products  in a heart healthy meal plan.        Personal Goal  #2 Re-Evaluation   Personal Goal #2 Pt to introduce new foods each week to maintain variety and enjoyment of food Pt to introduce new foods each week to maintain variety and enjoyment of food        Personal Goal #3 Re-Evaluation   Personal Goal #3 Pt to learn to read labels Pt to learn to read labels             Nutrition Goals Re-Evaluation:  Nutrition Goals Re-Evaluation    [place] Name 05/04/20 1429 06/04/20 0729           Goals   Current Weight 197 lb (89.4 kg) 194 lb (88 kg)      Nutrition Goal Pt to build a healthy plate including vegetables, fruits, whole grains, and low-fat dairy products in a heart healthy meal plan. Pt to build a healthy plate including vegetables, fruits, whole grains, and low-fat dairy products in a heart healthy meal plan.        Personal Goal #2 Re-Evaluation   Personal Goal #2 Pt to introduce new foods each week to maintain variety and enjoyment of food Pt to introduce new foods each week to maintain variety and enjoyment of food        Personal Goal #3 Re-Evaluation   Personal Goal #3 Pt to learn to read labels Pt to learn to read labels             Nutrition Goals Discharge (Final Nutrition Goals Re-Evaluation):  Nutrition Goals Re-Evaluation - 06/04/20 0729      Goals   Current Weight 194 lb (88 kg)    Nutrition Goal Pt to build a healthy plate including vegetables, fruits, whole grains, and low-fat dairy products in a heart healthy meal plan.      Personal Goal #2 Re-Evaluation   Personal Goal #2 Pt to introduce new foods each week to maintain variety and enjoyment of food      Personal Goal #3 Re-Evaluation   Personal Goal #3 Pt to learn to read labels           Psychosocial: Target Goals: Acknowledge presence or absence of significant depression and/or stress, maximize coping skills, provide positive support system. Participant is able to verbalize types and ability to use techniques and skills needed for reducing stress and  depression.  Initial Review & Psychosocial Screening:  Initial Psych Review & Screening - 04/23/20 0921      Initial Review   Current issues with None Identified      Family Dynamics   Good Support System? Yes   Bernerd has his wife for support     Barriers   Psychosocial barriers to participate in program There are no identifiable barriers or psychosocial needs.      Screening Interventions   Interventions Encouraged to exercise           Quality of Life Scores:  Quality of Life - 04/23/20 1254      Quality of Life   Select Quality of Life      Quality of Life Scores   Health/Function Pre 26 %    Socioeconomic Pre 29 %    Psych/Spiritual Pre 27.43 %    Family Pre 27.6 %    GLOBAL Pre 27.09 %          Scores of 19 and below usually indicate a poorer quality of life in these areas.  A difference of  2-3 points is a clinically meaningful difference.  A difference of 2-3 points in the total score of the Quality of Life Index has been associated with significant improvement in overall quality of life, self-image, physical symptoms, and general health in studies assessing change in quality of life.  PHQ-9: Recent Review Flowsheet Data    Depression screen [facility] 2/9 04/23/2020   Decreased Interest 0   Down, Depressed, Hopeless 0   PHQ - 2 Score 0     Interpretation of Total Score  Total Score Depression Severity:  1-4 = Minimal depression, 5-9 = Mild depression, 10-14 = Moderate depression, 15-19 = Moderately severe depression, 20-27 = Severe depression   Psychosocial Evaluation and Intervention:  Psychosocial Evaluation - 04/28/20 0730      Psychosocial Evaluation & Interventions   Interventions Encouraged to exercise with the program and follow exercise prescription    Comments Mr. Pettinato has no psychosocial barriers to self health management or participation in CR. He presents to his first day of exercise with a positive attitude and outlook. He admits to having a  strong support system. He enjoys fishing, yard work, and music and utilizes these hobbies for stress prevention and relief. No interventions needed at this time.    Expected Outcomes Patient will continue to have a positive attitude and outlook.    Continue Psychosocial Services  No Follow up required           Psychosocial Re-Evaluation:  Psychosocial Re-Evaluation    [place] Name 05/05/20 0750 06/02/20 0859           Psychosocial Re-Evaluation   Current issues with None Identified None Identified      Comments Mr. Kreft has no psychosocial barriers to self health management or participation in CR. He continues to have a positive attitude and outlook. He admits to having a strong support system. He enjoys fishing, yard work, and music and utilizes these hobbies for stress prevention and relief. No interventions needed at this time. Mr. Noyce has no psychosocial barriers to self health management or participation in CR. He continues to have a positive attitude and outlook. He admits to having a strong support system. He enjoys fishing, yard work, and music and utilizes these hobbies for stress prevention and relief. No interventions needed at this time.      Expected Outcomes Patient will continue to have a positive attitude and outlook. Patient will continue to have a positive attitude and outlook.      Interventions Encouraged to attend Cardiac Rehabilitation for the exercise Encouraged to attend Cardiac Rehabilitation for the exercise      Continue Psychosocial Services  No Follow up required No Follow up required             Psychosocial Discharge (Final Psychosocial Re-Evaluation):  Psychosocial Re-Evaluation - 06/02/20 0859      Psychosocial Re-Evaluation   Current issues with None Identified    Comments Mr. Bertagnolli has no psychosocial barriers to self health management or participation in CR. He continues to have a positive attitude and outlook. He admits to having a strong support  system. He enjoys fishing, yard work, and music and utilizes these hobbies for stress prevention and relief. No interventions needed at this time.    Expected Outcomes Patient will continue to have a positive attitude and outlook.    Interventions Encouraged to attend Cardiac Rehabilitation for the exercise    Continue Psychosocial Services  No Follow up required  Vocational Rehabilitation: Provide vocational rehab assistance to qualifying candidates.   Vocational Rehab Evaluation & Intervention:  Vocational Rehab - 04/23/20 9741      Initial Vocational Rehab Evaluation & Intervention   Assessment shows need for Vocational Rehabilitation No   Rayford is retired and does not need vocational rehab at this time          Education: Education Goals: Education classes will be provided on a weekly basis, covering required topics. Participant will state understanding/return demonstration of topics presented.  Learning Barriers/Preferences:  Learning Barriers/Preferences - 04/23/20 1026      Learning Barriers/Preferences   Learning Barriers Sight   wears glasses   Learning Preferences Audio;Pictoral;Skilled Demonstration;Verbal Instruction;Video           Education Topics: Count Your Pulse:  -Group instruction provided by verbal instruction, demonstration, patient participation and written materials to support subject.  Instructors address importance of being able to find your pulse and how to count your pulse when at home without a heart monitor.  Patients get hands on experience counting their pulse with staff help and individually.   Heart Attack, Angina, and Risk Factor Modification:  -Group instruction provided by verbal instruction, video, and written materials to support subject.  Instructors address signs and symptoms of angina and heart attacks.    Also discuss risk factors for heart disease and how to make changes to improve heart health risk  factors.   Functional Fitness:  -Group instruction provided by verbal instruction, demonstration, patient participation, and written materials to support subject.  Instructors address safety measures for doing things around the house.  Discuss how to get up and down off the floor, how to pick things up properly, how to safely get out of a chair without assistance, and balance training.   Meditation and Mindfulness:  -Group instruction provided by verbal instruction, patient participation, and written materials to support subject.  Instructor addresses importance of mindfulness and meditation practice to help reduce stress and improve awareness.  Instructor also leads participants through a meditation exercise.    Stretching for Flexibility and Mobility:  -Group instruction provided by verbal instruction, patient participation, and written materials to support subject.  Instructors lead participants through series of stretches that are designed to increase flexibility thus improving mobility.  These stretches are additional exercise for major muscle groups that are typically performed during regular warm up and cool down.   Hands Only CPR:  -Group verbal, video, and participation provides a basic overview of AHA guidelines for community CPR. Role-play of emergencies allow participants the opportunity to practice calling for help and chest compression technique with discussion of AED use.   Hypertension: -Group verbal and written instruction that provides a basic overview of hypertension including the most recent diagnostic guidelines, risk factor reduction with self-care instructions and medication management.    Nutrition I class: Heart Healthy Eating:  -Group instruction provided by PowerPoint slides, verbal discussion, and written materials to support subject matter. The instructor gives an explanation and review of the Therapeutic Lifestyle Changes diet recommendations, which includes a  discussion on lipid goals, dietary fat, sodium, fiber, plant stanol/sterol esters, sugar, and the components of a well-balanced, healthy diet.   Nutrition II class: Lifestyle Skills:  -Group instruction provided by PowerPoint slides, verbal discussion, and written materials to support subject matter. The instructor gives an explanation and review of label reading, grocery shopping for heart health, heart healthy recipe modifications, and ways to make healthier choices when eating out.  Diabetes Question & Answer:  -Group instruction provided by PowerPoint slides, verbal discussion, and written materials to support subject matter. The instructor gives an explanation and review of diabetes co-morbidities, pre- and post-prandial blood glucose goals, pre-exercise blood glucose goals, signs, symptoms, and treatment of hypoglycemia and hyperglycemia, and foot care basics.   Diabetes Blitz:  -Group instruction provided by PowerPoint slides, verbal discussion, and written materials to support subject matter. The instructor gives an explanation and review of the physiology behind type 1 and type 2 diabetes, diabetes medications and rational behind using different medications, pre- and post-prandial blood glucose recommendations and Hemoglobin A1c goals, diabetes diet, and exercise including blood glucose guidelines for exercising safely.    Portion Distortion:  -Group instruction provided by PowerPoint slides, verbal discussion, written materials, and food models to support subject matter. The instructor gives an explanation of serving size versus portion size, changes in portions sizes over the last 20 years, and what consists of a serving from each food group.   Stress Management:  -Group instruction provided by verbal instruction, video, and written materials to support subject matter.  Instructors review role of stress in heart disease and how to cope with stress positively.     Exercising on Your  Own:  -Group instruction provided by verbal instruction, power point, and written materials to support subject.  Instructors discuss benefits of exercise, components of exercise, frequency and intensity of exercise, and end points for exercise.  Also discuss use of nitroglycerin and activating EMS.  Review options of places to exercise outside of rehab.  Review guidelines for sex with heart disease.   Cardiac Drugs I:  -Group instruction provided by verbal instruction and written materials to support subject.  Instructor reviews cardiac drug classes: antiplatelets, anticoagulants, beta blockers, and statins.  Instructor discusses reasons, side effects, and lifestyle considerations for each drug class.   Cardiac Drugs II:  -Group instruction provided by verbal instruction and written materials to support subject.  Instructor reviews cardiac drug classes: angiotensin converting enzyme inhibitors (ACE-I), angiotensin II receptor blockers (ARBs), nitrates, and calcium channel blockers.  Instructor discusses reasons, side effects, and lifestyle considerations for each drug class.   Anatomy and Physiology of the Circulatory System:  Group verbal and written instruction and models provide basic cardiac anatomy and physiology, with the coronary electrical and arterial systems. Review of: AMI, Angina, Valve disease, Heart Failure, Peripheral Artery Disease, Cardiac Arrhythmia, Pacemakers, and the ICD.   Other Education:  -Group or individual verbal, written, or video instructions that support the educational goals of the cardiac rehab program.   Holiday Eating Survival Tips:  -Group instruction provided by PowerPoint slides, verbal discussion, and written materials to support subject matter. The instructor gives patients tips, tricks, and techniques to help them not only survive but enjoy the holidays despite the onslaught of food that accompanies the holidays.   Knowledge Questionnaire Score:   Knowledge Questionnaire Score - 04/23/20 1024      Knowledge Questionnaire Score   Pre Score 19/24           Core Components/Risk Factors/Patient Goals at Admission:  Personal Goals and Risk Factors at Admission - 04/23/20 1024      Core Components/Risk Factors/Patient Goals on Admission    Weight Management Yes;Weight Loss    Intervention Weight Management: Develop a combined nutrition and exercise program designed to reach desired caloric intake, while maintaining appropriate intake of nutrient and fiber, sodium and fats, and appropriate energy expenditure required for the weight goal.;Weight  Management: Provide education and appropriate resources to help participant work on and attain dietary goals.;Weight Management/Obesity: Establish reasonable short term and long term weight goals.    Admit Weight 197 lb 8.5 oz (89.6 kg)    Expected Outcomes Short Term: Continue to assess and modify interventions until short term weight is achieved;Long Term: Adherence to nutrition and physical activity/exercise program aimed toward attainment of established weight goal;Weight Maintenance: Understanding of the daily nutrition guidelines, which includes 25-35% calories from fat, 7% or less cal from saturated fats, less than 200mg  cholesterol, less than 1.5gm of sodium, & 5 or more servings of fruits and vegetables daily;Weight Loss: Understanding of general recommendations for a balanced deficit meal plan, which promotes 1-2 lb weight loss per week and includes a negative energy balance of [phone] kcal/d;Understanding recommendations for meals to include 15-35% energy as protein, 25-35% energy from fat, 35-60% energy from carbohydrates, less than 200mg  of dietary cholesterol, 20-35 gm of total fiber daily;Understanding of distribution of calorie intake throughout the day with the consumption of 4-5 meals/snacks    Hypertension Yes    Intervention Provide education on lifestyle modifcations including regular  physical activity/exercise, weight management, moderate sodium restriction and increased consumption of fresh fruit, vegetables, and low fat dairy, alcohol moderation, and smoking cessation.;Monitor prescription use compliance.    Expected Outcomes Short Term: Continued assessment and intervention until BP is < 140/88mm HG in hypertensive participants. < 130/48mm HG in hypertensive participants with diabetes, heart failure or chronic kidney disease.;Long Term: Maintenance of blood pressure at goal levels.    Lipids Yes    Intervention Provide education and support for participant on nutrition & aerobic/resistive exercise along with prescribed medications to achieve LDL 70mg , HDL >40mg .    Expected Outcomes Short Term: Participant states understanding of desired cholesterol values and is compliant with medications prescribed. Participant is following exercise prescription and nutrition guidelines.;Long Term: Cholesterol controlled with medications as prescribed, with individualized exercise RX and with personalized nutrition plan. Value goals: LDL < 70mg , HDL > 40 mg.           Core Components/Risk Factors/Patient Goals Review:   Goals and Risk Factor Review    Row Name 04/28/20 0734 05/05/20 0751 06/02/20 0900         Core Components/Risk Factors/Patient Goals Review   Personal Goals Review Weight Management/Obesity;Lipids;Hypertension Weight Management/Obesity;Lipids;Hypertension Weight Management/Obesity;Lipids;Hypertension     Review Mr. Cerny has multiple CAD risk factors. He is eager to participate in CR for RF modification. His goals are to have the stamina and strength to return to yard work and begin a walking program at [employer]. Mr. Kroft has multiple CAD risk factors. He is eager to continue to  participate in CR for RF modification. His goals are to have the stamina and strength to return to yard work and begin a walking program at [employer]. Mr. Dulworth has multiple CAD risk factors. He  is eager to continue to  participate in CR for RF modification. His goals are to have the stamina and strength to return to yard work and begin a walking program at [employer]. He is on target to meet his goals by graduation date 06/19/20.     Expected Outcomes Mr. Arave will continue to participate in CR for risk factor modifications. Mr. Mavis will continue to participate in CR for risk factor modifications. Mr. Baumgardner will continue to participate in CR for risk factor modifications.            Core  Components/Risk Factors/Patient Goals at Discharge (Final Review):   Goals and Risk Factor Review - 06/02/20 0900      Core Components/Risk Factors/Patient Goals Review   Personal Goals Review Weight Management/Obesity;Lipids;Hypertension    Review Mr. Londo has multiple CAD risk factors. He is eager to continue to  participate in CR for RF modification. His goals are to have the stamina and strength to return to yard work and begin a walking program at [employer]. He is on target to meet his goals by graduation date 06/19/20.    Expected Outcomes Mr. Wentland will continue to participate in CR for risk factor modifications.           ITP Comments:  ITP Comments    Row Name 04/23/20 0920 04/27/20 1600 05/05/20 0746 06/02/20 0856     ITP Comments Dr Fransico Him MD, Medical Director Mr. Gaynor completed his first cardiac rehab exercise session today and tolerated well. Denied cardiac complaints. VSS. Worked to an RPE of 11-13. Patient eager to continue his participation in CR for CAD risk factor modifications and increase his stamina and strength to be able to "get back to yard work" 30 day ITP review: Mr. Dobbins has completed 4 exercise sessions since admission. He continues to tolerated exercise well. Denies complaints and VS remain stable. Workes to an RPE of 11-13 with average mets 1.8-2.1. He is eager to continue to participate in CR for education and risk factor modification. Goals remain to increase  stamina and strength so yard work is easier and to establish a walking program at [employer]. Will continue to monitor progression towards goals over the next 30 days. 30 day ITP review: Mr. Manthey has completed 13 exercise sessions since admission. He continues to tolerated exercise well. Denies complaints and VS remain stable. Workes to an RPE of 11 with average mets 2.0-2.2 He is eager to continue to participate in CR for education and risk factor modification. Goals remain to increase stamina and strength so yard work is easier and to establish a walking program at [employer]. He is currently walking at home several days a week. He is scheduled to graduate 06/19/20 and on track to meet his personal goals.  Will continue to monitor progression towards goals.           Comments: see ITP documentation

## 2020-06-03 ENCOUNTER — Encounter (HOSPITAL_COMMUNITY)
Admission: RE | Admit: 2020-06-03 | Discharge: 2020-06-03 | Disposition: A | Discharge status: Home / Self Care | Payer: Medicare HMO | Source: Ambulatory Visit | Attending: Cardiology | Admitting: Cardiology

## 2020-06-03 ENCOUNTER — Other Ambulatory Visit: Payer: Self-pay

## 2020-06-03 DIAGNOSIS — I2121 ST elevation (STEMI) myocardial infarction involving left circumflex coronary artery: Secondary | ICD-10-CM | POA: Diagnosis not present

## 2020-06-05 ENCOUNTER — Encounter (HOSPITAL_COMMUNITY)
Admission: RE | Admit: 2020-06-05 | Discharge: 2020-06-05 | Disposition: A | Discharge status: Home / Self Care | Payer: Medicare HMO | Source: Ambulatory Visit | Attending: Cardiology | Admitting: Cardiology

## 2020-06-05 ENCOUNTER — Other Ambulatory Visit: Payer: Self-pay

## 2020-06-05 DIAGNOSIS — I2121 ST elevation (STEMI) myocardial infarction involving left circumflex coronary artery: Secondary | ICD-10-CM

## 2020-06-06 LAB — BASIC METABOLIC PANEL
BUN/Creatinine Ratio: 16 (ref 10–24)
BUN: 36 mg/dL — ABNORMAL HIGH (ref 8–27)
CO2: 19 mmol/L — ABNORMAL LOW (ref 20–29)
Calcium: 9.7 mg/dL (ref 8.6–10.2)
Chloride: 104 mmol/L (ref 96–106)
Creatinine, Ser: 2.23 mg/dL — ABNORMAL HIGH (ref 0.76–1.27)
GFR calc Af Amer: 31 mL/min/{1.73_m2} — ABNORMAL LOW (ref >59)
GFR calc non Af Amer: 27 mL/min/{1.73_m2} — ABNORMAL LOW (ref >59)
Glucose: 81 mg/dL (ref 65–99)
Potassium: 4.5 mmol/L (ref 3.5–5.2)
Sodium: 137 mmol/L (ref 134–144)

## 2020-06-06 LAB — SPECIMEN STATUS REPORT

## 2020-06-08 ENCOUNTER — Telehealth: Payer: Self-pay | Admitting: Cardiology

## 2020-06-08 ENCOUNTER — Other Ambulatory Visit: Payer: Self-pay

## 2020-06-08 ENCOUNTER — Encounter (HOSPITAL_COMMUNITY)
Admission: RE | Admit: 2020-06-08 | Discharge: 2020-06-08 | Disposition: A | Discharge status: Home / Self Care | Payer: Medicare HMO | Source: Ambulatory Visit | Attending: Cardiology | Admitting: Cardiology

## 2020-06-08 DIAGNOSIS — I2121 ST elevation (STEMI) myocardial infarction involving left circumflex coronary artery: Secondary | ICD-10-CM

## 2020-06-08 NOTE — Telephone Encounter (Signed)
Spoke to patient Dr.Jordan advised he needs to see Nephrology.Stated he did receive a call from [facility] to schedule appointment, but he was not sure.He will call back and schedule appointment.

## 2020-06-08 NOTE — Telephone Encounter (Signed)
Call returned to the patient. He stated that he was returning a call about a kidney referral that he thought was from Dr. Martinique. He was calling to see if this was necessary

## 2020-06-08 NOTE — Telephone Encounter (Signed)
   Pt said he received a call from kidney doctor, he wasn't sure if he needs to see them before or after the lab work.

## 2020-06-10 ENCOUNTER — Other Ambulatory Visit: Payer: Self-pay

## 2020-06-10 ENCOUNTER — Encounter (HOSPITAL_COMMUNITY)
Admission: RE | Admit: 2020-06-10 | Discharge: 2020-06-10 | Disposition: A | Discharge status: Home / Self Care | Payer: Medicare HMO | Source: Ambulatory Visit | Attending: Cardiology | Admitting: Cardiology

## 2020-06-10 VITALS — Ht 70.25 in | Wt 194.4 lb

## 2020-06-10 DIAGNOSIS — I2121 ST elevation (STEMI) myocardial infarction involving left circumflex coronary artery: Secondary | ICD-10-CM

## 2020-06-11 NOTE — Telephone Encounter (Signed)
Returned call to patient no answer.LMTC. 

## 2020-06-11 NOTE — Telephone Encounter (Signed)
Patient is calling to follow up regarding [facility] referral. He states he would like to [employer] [employer]. Patient states he has also been passing dark stool for the past few days. He states he has not had any pain or additional symptoms. Please return call to discuss.

## 2020-06-11 NOTE — Telephone Encounter (Signed)
Received a call from patient.He stated he has appointment with [facility] 06/18/20.Stated for the past couple of days he has noticed dark stools.Advised to call PCP.

## 2020-06-12 ENCOUNTER — Other Ambulatory Visit: Payer: Self-pay

## 2020-06-12 ENCOUNTER — Encounter (HOSPITAL_COMMUNITY)
Admission: RE | Admit: 2020-06-12 | Discharge: 2020-06-12 | Disposition: A | Discharge status: Home / Self Care | Payer: Medicare HMO | Source: Ambulatory Visit | Attending: Cardiology | Admitting: Cardiology

## 2020-06-12 DIAGNOSIS — I2121 ST elevation (STEMI) myocardial infarction involving left circumflex coronary artery: Secondary | ICD-10-CM | POA: Diagnosis not present

## 2020-06-15 ENCOUNTER — Encounter (HOSPITAL_COMMUNITY): Payer: Medicare HMO

## 2020-06-16 ENCOUNTER — Telehealth: Payer: Self-pay | Admitting: Cardiology

## 2020-06-16 NOTE — Telephone Encounter (Signed)
Called patient back about his message. Patient wanted to know if he is going to be on Plavix and eliquis for life. Informed patient that he was on Plavix due to having stents, and he would have to be on the Plavix for at least a year, but he might have to stay on it after a year, it all depends on his dx. Informed patient that he is on eliquis due to his A. FIB. Informed patient that if he goes in and out of this rhythm that he will most likely be on eliquis for life to prevent a stroke. Informed patient that a message would be sent to Dr. Martinique for further advisement. Informed patient that he can contact his pharmacy for more refills.

## 2020-06-16 NOTE — Telephone Encounter (Signed)
Pt c/o medication issue:  1. Name of Medication: clopidogrel (PLAVIX) 75 MG tablet / apixaban (ELIQUIS) 2.5 MG TABS tablet  2. How are you currently taking this medication (dosage and times per day)? As directed  3. Are you having a reaction (difficulty breathing--STAT)? no  4. What is your medication issue? Patient calling because he wants to know how long hes supposed to be taking these medications. He wants to know if he runs out is that the end or does he need to take these the rest of his life. Please advise.   Also, patient is seeing a Kidney Specialist per Dr. Doug Sou advice on Thursday and states some medications may be changed at that appt.

## 2020-06-17 ENCOUNTER — Other Ambulatory Visit: Payer: Self-pay

## 2020-06-17 ENCOUNTER — Encounter (HOSPITAL_COMMUNITY)
Admission: RE | Admit: 2020-06-17 | Discharge: 2020-06-17 | Disposition: A | Discharge status: Home / Self Care | Payer: Medicare HMO | Source: Ambulatory Visit | Attending: Cardiology | Admitting: Cardiology

## 2020-06-17 DIAGNOSIS — I2121 ST elevation (STEMI) myocardial infarction involving left circumflex coronary artery: Secondary | ICD-10-CM | POA: Diagnosis not present

## 2020-06-17 NOTE — Telephone Encounter (Signed)
Yes, ideally he will be on Plavix until next June when we will discontinue. The Eliquis he should be on lifelong to reduce his risk of stroke.  Kharisma Glasner Martinique MD, [facility]

## 2020-06-18 DIAGNOSIS — I480 Paroxysmal atrial fibrillation: Secondary | ICD-10-CM | POA: Diagnosis not present

## 2020-06-18 DIAGNOSIS — C61 Malignant neoplasm of prostate: Secondary | ICD-10-CM | POA: Diagnosis not present

## 2020-06-18 DIAGNOSIS — I2510 Atherosclerotic heart disease of native coronary artery without angina pectoris: Secondary | ICD-10-CM | POA: Diagnosis not present

## 2020-06-18 DIAGNOSIS — N184 Chronic kidney disease, stage 4 (severe): Secondary | ICD-10-CM | POA: Diagnosis not present

## 2020-06-18 DIAGNOSIS — N200 Calculus of kidney: Secondary | ICD-10-CM | POA: Diagnosis not present

## 2020-06-18 DIAGNOSIS — E785 Hyperlipidemia, unspecified: Secondary | ICD-10-CM | POA: Diagnosis not present

## 2020-06-18 DIAGNOSIS — I129 Hypertensive chronic kidney disease with stage 1 through stage 4 chronic kidney disease, or unspecified chronic kidney disease: Secondary | ICD-10-CM | POA: Diagnosis not present

## 2020-06-18 NOTE — Progress Notes (Signed)
Cardiology Office Note   Date:  06/24/2020   ID:  Tommy Dyer, DOB Feb 18, 1936, MRN [id number]  PCP:  [name], MD  Cardiologist:   Rufus Cypert Martinique, MD   Chief Complaint  Patient presents with   Atrial Fibrillation   Coronary Artery Disease      History of Present Illness: Tommy Dyer is a 84 y.o. male who presents for follow up CAD and atrial fibrillation. He has a PMH ofessential hypertension, STEMI (proximal LAD 40%, mid LAD 80%, second marginal 100% with DES x1, LVEF 55-60%), HLD, nephrolithiasis, stage IV CKD, and prostate cancer.  He presented to [facility]/18/2021for evaluation of chest pain. He presented to the cardiac Cath Lab and received DES x1 to the OM which was occluded. Recommendation for DAPT x1 year. He also had an 80% stenosis in the mid LAD and would be a candidate for PCI if he continued to have angina. Medical therapy was optimized.  Echocardiogram 03/14/2020 showed LVEF 55 to 60%, G1 DD. Started on metoprolol XL, continue amlodipine, Plavix, aspirin.  He presented to the emergency department on 03/26/2020 with dizziness.  His EKG showed atrial fibrillation with RVR at rate of 131 new onset.  He was seen in the emergency department.  Plavix was started  In place of Brilinta and Eliquis was started at 2.5 mg twice daily.  He was on Toprol. He was rate controlled and fluctuating between sinus rhythm and atrial fibrillation.  Also noted to have AKI with serum creatinine of 2.86 with a baseline around 2.5.    On follow up today he is doing well from a cardiac standpoint. Denies any chest pain other than occasional pain in lateral ribs. No palpitations. Has recently seen Dr Santiago Bumpers with [facility]. Patient states they told him kidney function was stable. Also states they wanted to change his BP medication but he doesn't know what this change was. No bleeding.     Past Medical History:  Diagnosis Date   Arthritis    CAD (coronary  artery disease)    a. 02/2020: STEMI with DES to 100% 2nd Mrg stenosis. Residual 80% mid-LAD stenosis with consideration of staged PCI recommended.     Gastric ulcer    GERD (gastroesophageal reflux disease)    History of kidney stones    Hypercholesterolemia    Hypertension    Kidney stones    Osteoporosis    Prostate cancer ([place])    STEMI (ST elevation myocardial infarction) [facility])     Past Surgical History:  Procedure Laterality Date   colonscopy     CORONARY/GRAFT ACUTE MI REVASCULARIZATION N/A 03/13/2020   Procedure: CORONARY/GRAFT ACUTE MI REVASCULARIZATION;  Surgeon: Martinique, Vitoria Conyer M, MD;  Location: [facility];  Service: Cardiovascular;  Laterality: N/A;   EXTRACORPOREAL SHOCK WAVE LITHOTRIPSY Right 06/21/2018   Procedure: RIGHT EXTRACORPOREAL SHOCK WAVE LITHOTRIPSY (ESWL);  Surgeon: Ardis Hughs, MD;  Location: [facility];  Service: Urology;  Laterality: Right;   LEFT HEART CATH AND CORONARY ANGIOGRAPHY N/A 03/13/2020   Procedure: LEFT HEART CATH AND CORONARY ANGIOGRAPHY;  Surgeon: Martinique, Brandonn Capelli M, MD;  Location: [facility];  Service: Cardiovascular;  Laterality: N/A;   PROSTATE BIOPSY     PROSTATE BIOPSY       Current Outpatient Medications  Medication Sig Dispense Refill   acetaminophen (TYLENOL) 500 MG tablet Take 1,000 mg by mouth every 6 (six) hours as needed for headache (pain).     amLODipine (NORVASC) 10 MG  tablet Take 10 mg by mouth daily.     apixaban (ELIQUIS) 2.5 MG TABS tablet Take 1 tablet (2.5 mg total) by mouth 2 (two) times daily. 180 tablet 3   atorvastatin (LIPITOR) 80 MG tablet Take 80 mg by mouth daily.     Carboxymethylcellulose Sodium (REFRESH TEARS OP) Place 1 drop into both eyes daily as needed (dry eyes).      clopidogrel (PLAVIX) 75 MG tablet Take 1 tablet (75 mg total) by mouth daily. 90 tablet 3   Famotidine (PEPCID AC PO) Take 1 tablet by mouth daily.     fluticasone (FLONASE) 50 MCG/ACT nasal spray Place 1  spray into both nostrils daily as needed for allergies or rhinitis.      Lidocaine-Glycerin (PREPARATION H EX) Place 1 application rectally daily as needed (hemorrhoids/pain/itching).      meclizine (ANTIVERT) 25 MG tablet Take 25 mg by mouth daily as needed for dizziness.     metoprolol succinate (TOPROL-XL) 50 MG 24 hr tablet Take 1 tablet (50 mg total) by mouth daily. Take with or immediately following a meal. 90 tablet 1   nitroGLYCERIN (NITROSTAT) 0.4 MG SL tablet Place 1 tablet (0.4 mg total) under the tongue every 5 (five) minutes x 3 doses as needed for chest pain. 25 tablet 2   tamsulosin (FLOMAX) 0.4 MG CAPS capsule TAKE 1 CAPSULE EVERY DAY  AFTER  SUPPER (Patient taking differently: Take 0.4 mg by mouth See admin instructions. Take one capsule (0.4 mg) by mouth every other day after supper) 90 capsule 1   No current facility-administered medications for this visit.    Allergies:   Patient has no known allergies.    Social History:  The patient  reports that he quit smoking about 16 years ago. He has a 20.00 pack-year smoking history. He has never used smokeless tobacco. He reports that he does not drink alcohol and does not use drugs.   Family History:  The patient's family history includes Breast cancer in his maternal grandmother; Cancer in his mother; Diabetes in his father and mother; High blood pressure in his father and mother.    ROS:  Please see the history of present illness.   Otherwise, review of systems are positive for none.   All other systems are reviewed and negative.    PHYSICAL EXAM: VS:  BP (!) 144/74    Pulse 81    Ht 5\' 10"  (1.778 m)    Wt 192 lb (87.1 kg)    SpO2 97%    BMI 27.55 kg/m  , BMI Body mass index is 27.55 kg/m. GEN: Well nourished, well developed, in no acute distress  HEENT: normal  Neck: no JVD, carotid bruits, or masses Cardiac: RRR; no murmurs, rubs, or gallops,no edema  Respiratory:  clear to auscultation bilaterally, normal work of  breathing GI: soft, nontender, nondistended, + BS MS: no deformity or atrophy  Skin: warm and dry, no rash Neuro:  Strength and sensation are intact Psych: euthymic mood, full affect   EKG:  EKG is not ordered today.   Recent Labs: 03/26/2020: Hemoglobin 13.3; Platelets 251 05/12/2020: ALT 36; BUN 36; Creatinine, Ser 2.23; Potassium 4.5; Sodium 137    Lipid Panel    Component Value Date/Time   CHOL 124 05/12/2020 0843   TRIG 91 05/12/2020 0843   HDL 27 (L) 05/12/2020 0843   CHOLHDL 4.6 05/12/2020 0843   CHOLHDL 7.3 03/14/2020 0414   VLDL 24 03/14/2020 0414   LDLCALC 79 05/12/2020 0843  Wt Readings from Last 3 Encounters:  06/24/20 192 lb (87.1 kg)  06/10/20 194 lb 7.1 oz (88.2 kg)  05/11/20 194 lb 9.6 oz (88.3 kg)      Other studies Reviewed: Additional studies/ records that were reviewed today include:   TTE Result date: 03/14/20 LVEF 55-60%, LV normal fxn, no WMA, grade I diastolic dysfxn, RV systolic fxn normal, mild-mod AV sclerosis  [place] angio/PCI Result date: 03/13/20 CORONARY/GRAFT ACUTE MI REVASCULARIZATION  LEFT HEART CATH AND CORONARY ANGIOGRAPHY  Conclusion    Prox LAD lesion is 40% stenosed.  Mid LAD lesion is 80% stenosed.  2nd Mrg lesion is 100% stenosed.  Post intervention, there is a 0% residual stenosis.  A drug-eluting stent was successfully placed using a STENT RESOLUTE ONYX [id number].  LV end diastolic pressure is mildly elevated.   1. 2 vessel obstructive CAD    -80% mid LAD    -100% second OM 2. Elevated LVEDP 23 mm Hg 3. Successful PCI of the second OM with DES x 1  Plan: DAPT for one year. Hydrate. Monitor renal function closely. Depending on recovery of renal function consider staged PCI of the mid LAD. Will assess LV function by Echo.      ASSESSMENT AND PLAN:  1.  Atrial fibrillation-New onset in early July. Converted to NSR. No recurrent symptoms.  This patients CHA2DS2-VASc Score and unadjusted Ischemic Stroke  Rate (% per year) is equal to 4.8 % stroke rate/year from a score of 4(HTN,MI/PAD/Aortic Plaque, Age) Continue Plavix, Eliquis, metoprolol Avoid triggers caffeine and EtOH etc.  2. CAD s/p lateral STEMI n/18/21-with emergent stenting of the first OM.  LVEF 55-60%.He does have residual disease with 80% mid LAD. This segment of the vessel is quite tortuous. He is not having  Angina on medical therapy.  Continue Plavix for one year from stent continue Eliquis amlodipine, metoprolol succinate, nitroglycerin Heart healthy low-sodium diet Stay active.   3. Essential hypertension-BP todayis well controlled.   Continueamlodipine, metoprolol Heart healthy low-sodium diet Will need to review Nephrology recommendations since I do not have that information today.  4. Hyperlipidemia-LDL improved from 140 to 79  on Atorvastatin 80 mg daily.  Was previously on fenofibrate but this was discontinued due to CKD.  Continue current therapy  5. CKD stage IV. GFR 22. Most likely related to HTN. No history of DM. Renal US in 2019 showed medical renal disease. He does have kidney stones but no obstruction. Recommend avoidance of NSAIDs. Recent evaluation by Dr Joylene Grapes- request a copy of his note to review changes.   Current medicines are reviewed at length with the patient today.  The patient does not have concerns regarding medicines.  The following changes have been made:  no change  Labs/ tests ordered today include:   No orders of the defined types were placed in this encounter.    Disposition:   FU with me in 6 months  Signed, Abdalla Naramore Martinique, MD  06/24/2020 11:48 AM    [facility] [address] [place], [place], [address] Phone [phone], Fax [phone]

## 2020-06-19 ENCOUNTER — Encounter (HOSPITAL_COMMUNITY)
Admission: RE | Admit: 2020-06-19 | Discharge: 2020-06-19 | Disposition: A | Discharge status: Home / Self Care | Payer: Medicare HMO | Source: Ambulatory Visit | Attending: Cardiology | Admitting: Cardiology

## 2020-06-19 ENCOUNTER — Other Ambulatory Visit: Payer: Self-pay

## 2020-06-19 DIAGNOSIS — I2121 ST elevation (STEMI) myocardial infarction involving left circumflex coronary artery: Secondary | ICD-10-CM | POA: Diagnosis not present

## 2020-06-19 NOTE — Telephone Encounter (Signed)
Spoke to patient Dr.Jordan's recommendation given. 

## 2020-06-19 NOTE — Progress Notes (Signed)
Discharge Progress Report  Patient Details  Name: Tommy Dyer MRN: [id number] Date of Birth: Feb 16, 1936 Referring Provider:     [place] from 04/23/2020 in [place]  Referring Provider Martinique, Peter, MD       Number of Visits: 20 out of 24 exercise sessions  Reason for Discharge:  Patient reached a stable level of exercise. Patient independent in their exercise. Patient has met program and personal goals.  Smoking History:  Social History   Tobacco Use  Smoking Status Former Smoker  . Packs/day: 1.00  . Years: 20.00  . Pack years: 20.00  . Quit date: 09/27/2003  . Years since quitting: 16.7  Smokeless Tobacco Never Used    Diagnosis:  03/13/20 STEMI, DES OM2  ADL UCSD:   Initial Exercise Prescription:  Initial Exercise Prescription - 04/23/20 1200      Date of Initial Exercise RX and Referring Provider   Date 04/23/20    Referring Provider Martinique, Peter, MD    Expected Discharge Date 06/19/20      Recumbant Bike   Level 1    RPM 60    Minutes 15    METs 1.8      NuStep   Level 2    SPM 75    Minutes 15    METs 1.8      Prescription Details   Frequency (times per week) 3    Duration Progress to 30 minutes of continuous aerobic without signs/symptoms of physical distress      Intensity   THRR 40-80% of Max Heartrate 56-111    Ratings of Perceived Exertion 11-13    Perceived Dyspnea 0-4      Progression   Progression Continue progressive overload as per policy without signs/symptoms or physical distress.      Resistance Training   Training Prescription Yes    Weight 3    Reps 10-15           Discharge Exercise Prescription (Final Exercise Prescription Changes):  Exercise Prescription Changes - 06/19/20 1455      Response to Exercise   Blood Pressure (Admit) 140/64    Blood Pressure (Exercise) 140/70    Blood Pressure (Exit) 124/70    Heart Rate (Admit) 96 bpm    Heart Rate  (Exercise) 102 bpm    Heart Rate (Exit) 95 bpm    Rating of Perceived Exertion (Exercise) 11    Symptoms None    Comments Pt graduated from the CRP2 program today    Duration Continue with 30 min of aerobic exercise without signs/symptoms of physical distress.    Intensity THRR unchanged      Progression   Progression Continue to progress workloads to maintain intensity without signs/symptoms of physical distress.    Average METs 2.2      Resistance Training   Training Prescription Yes    Weight 3 lbs    Reps 10-15    Time 10 Minutes      Interval Training   Interval Training No      NuStep   Level 3    SPM 85    Minutes 30    METs 2.2      Home Exercise Plan   Plans to continue exercise at Home (comment)    Frequency Add 2 additional days to program exercise sessions.    Initial Home Exercises Provided 05/04/20           Functional Capacity:  6  Minute Walk    Row Name 04/23/20 0930 06/10/20 1318       6 Minute Walk   Phase Initial Discharge    Distance 1216 feet 1341 feet    Distance % Change -- 10.28 %    Distance Feet Change -- 125 ft    Walk Time 6 minutes 6 minutes    # of Rest Breaks 0 0    MPH 2.3 2.54    METS 2.27 2.58    RPE 9 12    Perceived Dyspnea  0 0    VO2 Peak 7.94 9.01    Symptoms No No    Resting HR 90 bpm 79 bpm    Resting BP 138/60 144/72    Resting Oxygen Saturation  98 % 97 %    Exercise Oxygen Saturation  during 6 min walk 98 % 97 %    Max Ex. HR 99 bpm 98 bpm    Max Ex. BP 146/60 156/74    2 Minute Post BP 142/62 122/74           Psychological, QOL, Others - Outcomes: PHQ 2/9: Depression screen PHQ 2/9 04/23/2020  Decreased Interest 0  Down, Depressed, Hopeless 0  PHQ - 2 Score 0    Quality of Life:  Quality of Life - 06/19/20 1425      Quality of Life Scores   Health/Function Post 25.5 %    Socioeconomic Post 26.33 %    Psych/Spiritual Post 24.86 %    Family Post 26.2 %    GLOBAL Post 25.62 %            Personal Goals: Goals established at orientation with interventions provided to work toward goal.  Personal Goals and Risk Factors at Admission - 04/23/20 1024      Core Components/Risk Factors/Patient Goals on Admission    Weight Management Yes;Weight Loss    Intervention Weight Management: Develop a combined nutrition and exercise program designed to reach desired caloric intake, while maintaining appropriate intake of nutrient and fiber, sodium and fats, and appropriate energy expenditure required for the weight goal.;Weight Management: Provide education and appropriate resources to help participant work on and attain dietary goals.;Weight Management/Obesity: Establish reasonable short term and long term weight goals.    Admit Weight 197 lb 8.5 oz (89.6 kg)    Expected Outcomes Short Term: Continue to assess and modify interventions until short term weight is achieved;Long Term: Adherence to nutrition and physical activity/exercise program aimed toward attainment of established weight goal;Weight Maintenance: Understanding of the daily nutrition guidelines, which includes 25-35% calories from fat, 7% or less cal from saturated fats, less than 272m cholesterol, less than 1.5gm of sodium, & 5 or more servings of fruits and vegetables daily;Weight Loss: Understanding of general recommendations for a balanced deficit meal plan, which promotes 1-2 lb weight loss per week and includes a negative energy balance of [phone] kcal/d;Understanding recommendations for meals to include 15-35% energy as protein, 25-35% energy from fat, 35-60% energy from carbohydrates, less than 2087mof dietary cholesterol, 20-35 gm of total fiber daily;Understanding of distribution of calorie intake throughout the day with the consumption of 4-5 meals/snacks    Hypertension Yes    Intervention Provide education on lifestyle modifcations including regular physical activity/exercise, weight management, moderate sodium  restriction and increased consumption of fresh fruit, vegetables, and low fat dairy, alcohol moderation, and smoking cessation.;Monitor prescription use compliance.    Expected Outcomes Short Term: Continued assessment and intervention until BP is <  140/42m HG in hypertensive participants. < 130/848mHG in hypertensive participants with diabetes, heart failure or chronic kidney disease.;Long Term: Maintenance of blood pressure at goal levels.    Lipids Yes    Intervention Provide education and support for participant on nutrition & aerobic/resistive exercise along with prescribed medications to achieve LDL <7023mHDL >60m24m  Expected Outcomes Short Term: Participant states understanding of desired cholesterol values and is compliant with medications prescribed. Participant is following exercise prescription and nutrition guidelines.;Long Term: Cholesterol controlled with medications as prescribed, with individualized exercise RX and with personalized nutrition plan. Value goals: LDL < 70mg39mL > 40 mg.            Personal Goals Discharge:  Goals and Risk Factor Review    Row Name 04/28/20 0734 05/05/20 0751 06/02/20 0900 06/25/20 0811       Core Components/Risk Factors/Patient Goals Review   Personal Goals Review Weight Management/Obesity;Lipids;Hypertension Weight Management/Obesity;Lipids;Hypertension Weight Management/Obesity;Lipids;Hypertension Weight Management/Obesity;Lipids;Hypertension    Review Mr. Forness has multiple CAD risk factors. He is eager to participate in CR for RF modification. His goals are to have the stamina and strength to return to yard work and begin a walking program at the [employer] [facility] risk [facility]. He is eager to continue to  participate in CR for RF modification. His goals are to have the stamina and strength to return to yard work and begin a walking program at the [employer] [facility] risk [facility]. He is eager to continue to   participate in CR for RF modification. His goals are to have the stamina and strength to return to yard work and begin a walking program at the [employer] [facility] [place] to meet his goals by graduation date 06/19/20. Mr. [place] CAD risk factors. He has met his program goals and is managing his risk factors by taking medications as prescribed, attending all medical appointments, exercising at home, and adhering to a heart healthy diet.    Expected Outcomes Mr. Capelle will continue to participate in CR for risk factor modifications. Mr. [place]ek[place] continue to participate in CR for risk factor modifications. Mr. [place] continue to participate in CR for risk factor modifications. Mr. CheekRuan continue to utilize exercise and education recieved in CR to modify and control CAD risk factors long term.           Exercise Goals and Review:  Exercise Goals    Row Name 04/23/20 1233             Exercise Goals   Increase Physical Activity Yes       Intervention Provide advice, education, support and counseling about physical activity/exercise needs.;Develop an individualized exercise prescription for aerobic and resistive training based on initial evaluation findings, risk stratification, comorbidities and participant's personal goals.       Expected Outcomes Short Term: Attend rehab on a regular basis to increase amount of physical activity.;Long Term: Add in home exercise to make exercise part of routine and to increase amount of physical activity.;Long Term: Exercising regularly at least 3-5 days a week.       Increase Strength and Stamina Yes       Intervention Provide advice, education, support and counseling about physical activity/exercise needs.;Develop an individualized exercise prescription for aerobic and resistive training based on initial evaluation findings, risk stratification, comorbidities and participant's personal goals.       Expected Outcomes Short Term: Increase workloads  from initial exercise prescription for resistance, speed, and METs.;Short Term: Perform resistance training exercises routinely during rehab and add in resistance training at home       Able to understand and use rate of perceived exertion (RPE) scale Yes       Intervention Provide education and explanation on how to use RPE scale       Expected Outcomes Short Term: Able to use RPE daily in rehab to express subjective intensity level       Knowledge and understanding of Target Heart Rate Range (THRR) Yes       Intervention Provide education and explanation of THRR including how the numbers were predicted and where they are located for reference       Expected Outcomes Short Term: Able to state/look up THRR;Long Term: Able to use THRR to govern intensity when exercising independently;Short Term: Able to use daily as guideline for intensity in rehab       Able to check pulse independently Yes       Intervention Provide education and demonstration on how to check pulse in carotid and radial arteries.;Review the importance of being able to check your own pulse for safety during independent exercise       Expected Outcomes Short Term: Able to explain why pulse checking is important during independent exercise;Long Term: Able to check pulse independently and accurately       Understanding of Exercise Prescription Yes       Intervention Provide education, explanation, and written materials on patient's individual exercise prescription       Expected Outcomes Short Term: Able to explain program exercise prescription;Long Term: Able to explain home exercise prescription to exercise independently              Exercise Goals Re-Evaluation:  Exercise Goals Re-Evaluation    Row Name 04/27/20 1430 05/04/20 1455 05/28/20 1253 06/19/20 1435       Exercise Goal Re-Evaluation   Exercise Goals Review Increase Physical Activity;Increase Strength and Stamina;Able to understand and use rate of perceived exertion  (RPE) scale;Knowledge and understanding of Target Heart Rate Range (THRR);Understanding of Exercise Prescription Increase Physical Activity;Increase Strength and Stamina;Able to understand and use rate of perceived exertion (RPE) scale;Knowledge and understanding of Target Heart Rate Range (THRR);Understanding of Exercise Prescription Increase Physical Activity;Increase Strength and Stamina;Able to understand and use rate of perceived exertion (RPE) scale;Knowledge and understanding of Target Heart Rate Range (THRR);Able to check pulse independently;Understanding of Exercise Prescription Increase Physical Activity;Increase Strength and Stamina;Able to understand and use rate of perceived exertion (RPE) scale;Knowledge and understanding of Target Heart Rate Range (THRR);Understanding of Exercise Prescription    Comments Pt's first day of exercise in the CRP2 program. Pt tolerted session well. However, pt found the recumbent bike uncomfortable and despite attempts to readjust seat, pt placed back on the N[employer].Pt understands exercise RX, THRR, and RPE scale. Reviewed home exercise program. Encouraed patient to try and incorporate 2-3 days of walking at home for 30 minutes. Reviewed METs and goals. Pt is making slow progress. Pt is walking 2 x/wk for at least 30 minutes. Pt graduated from the [facility] program today. Pt progressed well through program and had an average MET level of 2.2 METs. Pt palns to continue to exercise by walking at home. He may also avail himself of the silver-sneakers program and exericse at a local gym.    Expected Outcomes Will continue to monitor pt and progress exercise worklads as tolerated. Pt will walk at home  2 x/wk for 30 minutes total. Will continue to progress patient s as tolerated and pt will continue to walk at home 2x/wk. Pt will continue to exercise at home on his own.           Nutrition & Weight - Outcomes:  Pre Biometrics - 04/23/20 0900      Pre Biometrics   Waist  Circumference 43 inches    Hip Circumference 42 inches    Waist to Hip Ratio 1.02 %    Triceps Skinfold 20 mm    % Body Fat 30.5 %    Grip Strength 44 kg    Flexibility 0 in    Single Leg Stand 8.31 seconds           Post Biometrics - 06/10/20 1300       Post  Biometrics   Height 5' 10.25" (1.784 m)    Weight 88.2 kg    Waist Circumference 42.5 inches    Hip Circumference 42 inches    Waist to Hip Ratio 1.01 %    BMI (Calculated) 27.71    Triceps Skinfold 18.5 mm    % Body Fat 29.8 %    Grip Strength 48 kg    Flexibility 0 in    Single Leg Stand 10.58 seconds           Nutrition:  Nutrition Therapy & Goals - 05/04/20 1428      Nutrition Therapy   Diet Heart Healthy    Drug/Food Interactions Statins/Certain Fruits      Personal Nutrition Goals   Nutrition Goal Pt to build a healthy plate including vegetables, fruits, whole grains, and low-fat dairy products in a heart healthy meal plan.    Personal Goal #2 Pt to introduce new foods each week to maintain variety and enjoyment of food    Personal Goal #3 Pt to learn to read labels      Intervention Plan   Intervention Nutrition handout(s) given to patient.;Prescribe, educate and counsel regarding individualized specific dietary modifications aiming towards targeted core components such as weight, hypertension, lipid management, diabetes, heart failure and other comorbidities.    Expected Outcomes Short Term Goal: A plan has been developed with personal nutrition goals set during dietitian appointment.;Long Term Goal: Adherence to prescribed nutrition plan.           Nutrition Discharge:  Nutrition Assessments - 06/24/20 0742      MEDFICTS Scores   Post Score 21           Education Questionnaire Score:  Knowledge Questionnaire Score - 06/22/20 0925      Knowledge Questionnaire Score   Post Score 22/24           Goals reviewed with patient; copy given to patient.

## 2020-06-22 DIAGNOSIS — I10 Essential (primary) hypertension: Secondary | ICD-10-CM | POA: Diagnosis not present

## 2020-06-24 ENCOUNTER — Ambulatory Visit: Payer: Medicare HMO | Admitting: Cardiology

## 2020-06-24 ENCOUNTER — Encounter: Payer: Self-pay | Admitting: Cardiology

## 2020-06-24 ENCOUNTER — Other Ambulatory Visit: Payer: Self-pay

## 2020-06-24 VITALS — BP 144/74 | HR 81 | Ht 70.0 in | Wt 192.0 lb

## 2020-06-24 DIAGNOSIS — I25118 Atherosclerotic heart disease of native coronary artery with other forms of angina pectoris: Secondary | ICD-10-CM

## 2020-06-24 DIAGNOSIS — I10 Essential (primary) hypertension: Secondary | ICD-10-CM | POA: Diagnosis not present

## 2020-06-24 DIAGNOSIS — E7800 Pure hypercholesterolemia, unspecified: Secondary | ICD-10-CM | POA: Diagnosis not present

## 2020-06-24 DIAGNOSIS — I480 Paroxysmal atrial fibrillation: Secondary | ICD-10-CM

## 2020-06-24 DIAGNOSIS — N184 Chronic kidney disease, stage 4 (severe): Secondary | ICD-10-CM

## 2020-06-25 ENCOUNTER — Telehealth: Payer: Self-pay

## 2020-06-25 DIAGNOSIS — I10 Essential (primary) hypertension: Secondary | ICD-10-CM | POA: Diagnosis not present

## 2020-06-25 NOTE — Telephone Encounter (Signed)
Spoke to patient Dr.Jordan reviewed 06/15/20 office visit with Dr.Samuel Joylene Grapes with [facility].Advised ok to start Irbesartan 75 mg daily and decrease Amlodipine to 5 mg daily.Advised to have a bmet in 2 weeks.Stated bmet already scheduled at [facility].

## 2020-07-01 DIAGNOSIS — N183 Chronic kidney disease, stage 3 unspecified: Secondary | ICD-10-CM | POA: Diagnosis not present

## 2020-07-01 DIAGNOSIS — I10 Essential (primary) hypertension: Secondary | ICD-10-CM | POA: Diagnosis not present

## 2020-07-01 DIAGNOSIS — Z23 Encounter for immunization: Secondary | ICD-10-CM | POA: Diagnosis not present

## 2020-07-01 DIAGNOSIS — I2510 Atherosclerotic heart disease of native coronary artery without angina pectoris: Secondary | ICD-10-CM | POA: Diagnosis not present

## 2020-07-01 DIAGNOSIS — I213 ST elevation (STEMI) myocardial infarction of unspecified site: Secondary | ICD-10-CM | POA: Diagnosis not present

## 2020-07-01 DIAGNOSIS — C61 Malignant neoplasm of prostate: Secondary | ICD-10-CM | POA: Diagnosis not present

## 2020-07-01 DIAGNOSIS — E7800 Pure hypercholesterolemia, unspecified: Secondary | ICD-10-CM | POA: Diagnosis not present

## 2020-07-01 DIAGNOSIS — N189 Chronic kidney disease, unspecified: Secondary | ICD-10-CM | POA: Diagnosis not present

## 2020-07-07 DIAGNOSIS — N184 Chronic kidney disease, stage 4 (severe): Secondary | ICD-10-CM | POA: Diagnosis not present

## 2020-07-13 ENCOUNTER — Other Ambulatory Visit: Payer: Self-pay | Admitting: Student

## 2020-07-21 DIAGNOSIS — I10 Essential (primary) hypertension: Secondary | ICD-10-CM | POA: Diagnosis not present

## 2020-07-24 DIAGNOSIS — I10 Essential (primary) hypertension: Secondary | ICD-10-CM | POA: Diagnosis not present

## 2020-07-28 DIAGNOSIS — C61 Malignant neoplasm of prostate: Secondary | ICD-10-CM | POA: Diagnosis not present

## 2020-07-29 ENCOUNTER — Other Ambulatory Visit: Payer: Self-pay

## 2020-07-29 ENCOUNTER — Telehealth: Payer: Self-pay | Admitting: Cardiology

## 2020-07-29 MED ORDER — ATORVASTATIN CALCIUM 80 MG PO TABS: 80.0000 mg | ORAL_TABLET | Freq: Every day | ORAL | 3 refills | Status: DC

## 2020-07-29 NOTE — Telephone Encounter (Signed)
Patient need to be filled paper work for [employer] for get free supplies of Lipitor. I have advise him that Dr. Martinique on vacation who come back next week. He wants call back from Dr. Martinique nurse.

## 2020-07-29 NOTE — Telephone Encounter (Signed)
Pt called in and would like to see if he could speak with Dr Martinique nurse to go over his meds. He is a little confused as to how and exactly what he should taking.    Best number [phone]

## 2020-07-31 NOTE — Telephone Encounter (Signed)
Spoke to patient on 07/27/20.He needed Lipitor refill sent to mail order pharmacy.90 day Lipitor refill sent.

## 2020-08-04 DIAGNOSIS — C61 Malignant neoplasm of prostate: Secondary | ICD-10-CM | POA: Diagnosis not present

## 2020-08-04 DIAGNOSIS — N200 Calculus of kidney: Secondary | ICD-10-CM | POA: Diagnosis not present

## 2020-08-04 DIAGNOSIS — N5201 Erectile dysfunction due to arterial insufficiency: Secondary | ICD-10-CM | POA: Diagnosis not present

## 2020-08-10 ENCOUNTER — Ambulatory Visit: Payer: Medicare HMO | Admitting: Cardiology

## 2020-08-31 ENCOUNTER — Telehealth: Payer: Self-pay | Admitting: Cardiology

## 2020-08-31 NOTE — Telephone Encounter (Signed)
Spoke with pt, he is concerned that he has lost 10 lbs and he thinks it maybe related to his medication. Aware in do not see any medications on his list that can cause weight loss. He does report he has changed his diet and is eating better. He would like the pharmacist to look aver his medications to see if any are of concern. He feels fine just concerned about the weight loss.

## 2020-08-31 NOTE — Telephone Encounter (Signed)
Tommy Dyer is calling stating he has recently lost 10 lbs and wants to know if it could be due to the medication he is currently taking. He is requesting a callback to discuss this with a nurse. Please advise.

## 2020-08-31 NOTE — Telephone Encounter (Signed)
Agree that none of his medications should have contributed to weight loss. Would not be concerned as pt's BMI still places him in the overweight category. If his diet has improved, this is likely the cause of his weight loss. Would encourage him to keep up healthy eating habits.

## 2020-08-31 NOTE — Telephone Encounter (Signed)
Spoke with pt, aware of the pharmacist recommendations. °

## 2020-09-14 DIAGNOSIS — N184 Chronic kidney disease, stage 4 (severe): Secondary | ICD-10-CM | POA: Diagnosis not present

## 2020-09-23 DIAGNOSIS — I2510 Atherosclerotic heart disease of native coronary artery without angina pectoris: Secondary | ICD-10-CM | POA: Diagnosis not present

## 2020-09-23 DIAGNOSIS — N200 Calculus of kidney: Secondary | ICD-10-CM | POA: Diagnosis not present

## 2020-09-23 DIAGNOSIS — C61 Malignant neoplasm of prostate: Secondary | ICD-10-CM | POA: Diagnosis not present

## 2020-09-23 DIAGNOSIS — I129 Hypertensive chronic kidney disease with stage 1 through stage 4 chronic kidney disease, or unspecified chronic kidney disease: Secondary | ICD-10-CM | POA: Diagnosis not present

## 2020-09-23 DIAGNOSIS — E785 Hyperlipidemia, unspecified: Secondary | ICD-10-CM | POA: Diagnosis not present

## 2020-09-23 DIAGNOSIS — N184 Chronic kidney disease, stage 4 (severe): Secondary | ICD-10-CM | POA: Diagnosis not present

## 2020-09-23 DIAGNOSIS — I480 Paroxysmal atrial fibrillation: Secondary | ICD-10-CM | POA: Diagnosis not present

## 2020-09-23 DIAGNOSIS — I10 Essential (primary) hypertension: Secondary | ICD-10-CM | POA: Diagnosis not present

## 2020-09-24 DIAGNOSIS — I10 Essential (primary) hypertension: Secondary | ICD-10-CM | POA: Diagnosis not present

## 2020-10-05 DIAGNOSIS — I213 ST elevation (STEMI) myocardial infarction of unspecified site: Secondary | ICD-10-CM | POA: Diagnosis not present

## 2020-10-05 DIAGNOSIS — E7800 Pure hypercholesterolemia, unspecified: Secondary | ICD-10-CM | POA: Diagnosis not present

## 2020-10-05 DIAGNOSIS — I10 Essential (primary) hypertension: Secondary | ICD-10-CM | POA: Diagnosis not present

## 2020-10-05 DIAGNOSIS — K219 Gastro-esophageal reflux disease without esophagitis: Secondary | ICD-10-CM | POA: Diagnosis not present

## 2020-10-05 DIAGNOSIS — I2510 Atherosclerotic heart disease of native coronary artery without angina pectoris: Secondary | ICD-10-CM | POA: Diagnosis not present

## 2020-10-05 DIAGNOSIS — N189 Chronic kidney disease, unspecified: Secondary | ICD-10-CM | POA: Diagnosis not present

## 2020-10-05 DIAGNOSIS — N183 Chronic kidney disease, stage 3 unspecified: Secondary | ICD-10-CM | POA: Diagnosis not present

## 2020-10-05 DIAGNOSIS — C61 Malignant neoplasm of prostate: Secondary | ICD-10-CM | POA: Diagnosis not present

## 2020-10-07 DIAGNOSIS — N184 Chronic kidney disease, stage 4 (severe): Secondary | ICD-10-CM | POA: Diagnosis not present

## 2020-10-16 ENCOUNTER — Telehealth: Payer: Self-pay | Admitting: Cardiology

## 2020-10-16 DIAGNOSIS — K921 Melena: Secondary | ICD-10-CM

## 2020-10-16 NOTE — Telephone Encounter (Signed)
It is concerning that he is having bleeding in his stools. He needs GI evaluation ASAP. If he is having significant bleeding (more than just spotting) he should hold Eliquis until GI evaluation is complete. If no significant GI pathology he should resume.  Isidor Bromell Martinique MD, [facility]

## 2020-10-16 NOTE — Telephone Encounter (Signed)
Pt c/o medication issue:  1. Name of Medication: apixaban (ELIQUIS) 2.5 MG TABS tablet    2. How are you currently taking this medication (dosage and times per day)? As prescribed  3. Are you having a reaction (difficulty breathing--STAT)? No.  4. What is your medication issue? Patient states that since he's been taking this medication he's noticed that he's having bleeding in his stools. Please advise.

## 2020-10-16 NOTE — Telephone Encounter (Signed)
This needs to be addressed by triage/MD Patient probably should be evaluated by GI, but all of this would be depending on the severity of bleeding

## 2020-10-16 NOTE — Telephone Encounter (Signed)
Spoke with patient and provided him with Dr. Doug Sou recommendations. GI referral placed and patient provided with number to GI office to set up appointment.

## 2020-10-26 ENCOUNTER — Ambulatory Visit: Payer: Medicare HMO | Admitting: Nurse Practitioner

## 2020-10-26 ENCOUNTER — Other Ambulatory Visit (INDEPENDENT_AMBULATORY_CARE_PROVIDER_SITE_OTHER): Payer: Medicare HMO

## 2020-10-26 ENCOUNTER — Encounter: Payer: Self-pay | Admitting: Nurse Practitioner

## 2020-10-26 VITALS — BP 140/62 | HR 85 | Ht 72.0 in | Wt 197.0 lb

## 2020-10-26 DIAGNOSIS — I4891 Unspecified atrial fibrillation: Secondary | ICD-10-CM

## 2020-10-26 DIAGNOSIS — I10 Essential (primary) hypertension: Secondary | ICD-10-CM | POA: Diagnosis not present

## 2020-10-26 DIAGNOSIS — K625 Hemorrhage of anus and rectum: Secondary | ICD-10-CM

## 2020-10-26 DIAGNOSIS — K648 Other hemorrhoids: Secondary | ICD-10-CM

## 2020-10-26 LAB — CBC
HCT: 34.3 % — ABNORMAL LOW (ref 39.0–52.0)
Hemoglobin: 11.8 g/dL — ABNORMAL LOW (ref 13.0–17.0)
MCHC: 34.3 g/dL (ref 30.0–36.0)
MCV: 83.8 fl (ref 78.0–100.0)
Platelets: 191 10*3/uL (ref 150.0–400.0)
RBC: 4.09 Mil/uL — ABNORMAL LOW (ref 4.22–5.81)
RDW: 15.3 % (ref 11.5–15.5)
WBC: 6.5 10*3/uL (ref 4.0–10.5)

## 2020-10-26 LAB — BASIC METABOLIC PANEL
BUN: 28 mg/dL — ABNORMAL HIGH (ref 6–23)
CO2: 24 mEq/L (ref 19–32)
Calcium: 9.5 mg/dL (ref 8.4–10.5)
Chloride: 107 mEq/L (ref 96–112)
Creatinine, Ser: 2.04 mg/dL — ABNORMAL HIGH (ref 0.40–1.50)
GFR: 29.82 mL/min — ABNORMAL LOW (ref >60.00)
Glucose, Bld: 91 mg/dL (ref 70–99)
Potassium: 3.8 mEq/L (ref 3.5–5.1)
Sodium: 138 mEq/L (ref 135–145)

## 2020-10-26 MED ORDER — HYDROCORTISONE ACETATE 25 MG RE SUPP
25.0000 mg | Freq: Every evening | RECTAL | 0 refills | Status: DC
Start: 2020-10-26 — End: 2021-04-12

## 2020-10-26 NOTE — Progress Notes (Signed)
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 He was last seen in office by Dr. Loletha Carrow 02/22/2019 for follow up due to rectal bleeding which improved after he used Preparation H. A colonoscopy to rule out radiation proctitis and colorectal malignancy was discussed, however, the patient did not wish to pursue any endoscopic evaluation.  He presents to our office today for further evaluation regarding recurrent rectal bleeding.  He intermittently strains to pass a normal formed brown bowel movement.  He reports seeing a small amount of bright red blood on the toilet tissue around the stool once every 2 weeks.  However, approximately 2 weeks ago he saw a moderate amount of bright red blood on the toilet tissue and drops in the toilet water which was concerning to him.  Contacted his cardiologist and Eliquis was held on 10/16/2020.  He remains on Plavix 5 mg daily.  He denies having any upper or lower abdominal pain.  He denies having recent laboratory studies.  No chest pain, palpitations or shortness of breath.  Colonoscopy 01/01/2005 by Dr. Rachelle Hora:  Four 2 mm polyps removed from the sigmoid colon, removed but not retrieved.   Diverticulosis to the descending and sigmoid colon  Colonoscopy 01/14/2003: 38mm and 94mm polyp removed from the ascending colon Five 2-18mm polyps removed from the sigmoid colon  Echocardiogram 03/14/2020: LVEF 55 to 60%  CBC Latest Ref Rng & Units 03/26/2020 03/14/2020 03/13/2020  WBC 4.0 - 10.5 K/uL 10.2 7.3 -  Hemoglobin 13.0 - 17.0 g/dL 13.3 13.8 12.6(L)  Hematocrit 39.0 - 52.0 % 37.8(L) 38.2(L) 37.0(L)  Platelets 150 - 400 K/uL 251  188 -    CMP Latest Ref Rng & Units 05/12/2020 03/26/2020 03/15/2020  Glucose 65 - 99 mg/dL 81 111(H) 100(H)  BUN 8 - 27 mg/dL 36(H) 38(H) 18  Creatinine 0.76 - 1.27 mg/dL 2.23(H) 2.83(H) 2.49(H)  Sodium 134 - 144 mmol/L 137 134(L) 136  Potassium 3.5 - 5.2 mmol/L 4.5 3.5 4.0  Chloride 96 - 106 mmol/L 104 106 106  CO2 20 - 29 mmol/L 19(L) 19(L) 20(L)  Calcium 8.6 - 10.2 mg/dL 9.7 9.5 9.3  Total Protein 6.0 - 8.5 g/dL 7.4 - -  Total Bilirubin 0.0 - 1.2 mg/dL 0.3 - -  Alkaline Phos 48 - 121 IU/L 58 - -  AST 0 - 40 IU/L 32 - -  ALT 0 - 44 IU/L 36 - -   Current Outpatient Medications on File Prior to Visit  Medication Sig Dispense Refill  . acetaminophen (TYLENOL) 500 MG tablet Take 1,000 mg by mouth every 6 (six) hours as needed for headache (pain).    Marland Kitchen atorvastatin (LIPITOR) 80 MG tablet Take 1 tablet (80 mg total) by mouth daily. 90 tablet 3  . Carboxymethylcellulose Sodium (REFRESH TEARS OP) Place 1 drop into both eyes daily as needed (dry eyes).     . clopidogrel (PLAVIX) 75 MG tablet Take 1 tablet (75 mg total) by mouth daily. 90 tablet 3  . Famotidine (PEPCID AC PO) Take 1 tablet by mouth daily.    . fluticasone (FLONASE) 50 MCG/ACT nasal spray Place 1 spray into both nostrils daily as needed for allergies or rhinitis.     Marland Kitchen  irbesartan (AVAPRO) 75 MG tablet Take 75 mg daily    . Lidocaine-Glycerin (PREPARATION H EX) Place 1 application rectally daily as needed (hemorrhoids/pain/itching).     . meclizine (ANTIVERT) 25 MG tablet Take 25 mg by mouth daily as needed for dizziness.    . metoprolol succinate (TOPROL-XL) 50 MG 24 hr tablet Take 1 tablet (50 mg total) by mouth daily. Take with or immediately following a meal. 90 tablet 1  . nitroGLYCERIN (NITROSTAT) 0.4 MG SL tablet Place 1 tablet (0.4 mg total) under the tongue every 5 (five) minutes x 3 doses as needed for chest pain. 25 tablet 2  . tamsulosin (FLOMAX) 0.4 MG CAPS capsule TAKE 1 CAPSULE EVERY DAY  AFTER  SUPPER (Patient  taking differently: Take 0.4 mg by mouth See admin instructions. Take one capsule (0.4 mg) by mouth every other day after supper) 90 capsule 1  . apixaban (ELIQUIS) 2.5 MG TABS tablet Take 1 tablet (2.5 mg total) by mouth 2 (two) times daily. (Patient not taking: Reported on 10/26/2020) 180 tablet 3   No current facility-administered medications on file prior to visit.   No Known Allergies .  Current Medications, Allergies, Past Medical History, Past Surgical History, Family History and Social History were reviewed in [employer] record.   Review of Systems:   Constitutional: Negative for fever, sweats, chills or weight loss.  Respiratory: Negative for shortness of breath.   Cardiovascular: Negative for chest pain, palpitations and leg swelling.  Gastrointestinal: See HPI.  Musculoskeletal: Negative for back pain or muscle aches.  Neurological: Negative for dizziness, headaches or paresthesias.    Physical Exam: BP 140/62   Pulse 85   Ht 6' (1.829 m)   Wt 197 lb (89.4 kg)   BMI 26.72 kg/m  General: Well developed 85 year old male in no acute distress. Head: Normocephalic and atraumatic. Eyes: No scleral icterus. Conjunctiva pink . Ears: Normal auditory acuity. Mouth: Dentition intact. No ulcers or lesions.  Lungs: Clear throughout to auscultation. Heart: Regular rate and rhythm, no murmur. Abdomen: Soft, nontender and nondistended. No masses or hepatomegaly. Normal bowel sounds x 4 quadrants.  Rectal: No external hemorrhoids small internal hemorrhoids without prolapse, no active bleeding.  No mass. Melissa CMA present during exam.  Musculoskeletal: Symmetrical with no gross deformities. Extremities: No edema. Neurological: Alert oriented x 4. No focal deficits.  Psychological: Alert and cooperative. Normal mood and affect  Assessment and Recommendations:  31. 85 year old male with rectal bleeding occurs when straining to pass a bowel movement.  Most  likely hemorrhoidal but cannot completely rule out radiation proctitis or colorectal malignancy.  Patient does not wish to pursue endoscopic evaluation unless absolutely necessary. -CBC -Anusol HC 25 mg 1 suppository PR nightly for 5 nights -Apply a small amount of Desitin inside the anal opening and to the external anal area tid as needed for anal or hemorrhoidal irritation/bleeding.  -Benefiber 1 tablespoon daily.  MiraLAX 1 capful mixed in 8 ounces of water nightly as tolerated. -Patient to call our office if his rectal bleeding worsen -Follow-up in the office with Dr. Loletha Carrow in 6-week -Await CBC result prior to providing GI recommendations regarding when to restart Eliquis.  2. CAD s/p STEMI 02/2020 s/p DES x 1 on Plavix, Metoprolol and Amlodipine   3. Atrial fibrillation CHA2DS2-VASc 4.  Eliquis on hold since 10/16/2020 per his cardiologist Dr. Martinique.  4. History of adenomatous colon polyps.  Patient does not wish to pursue endoscopies for colon polyp  surveillance  5. CKD stage 4, Cr. 2.23.

## 2020-10-26 NOTE — Patient Instructions (Addendum)
If you are age 83 or older, your body mass index should be between 23-30. Your Body mass index is 26.72 kg/m. If this is out of the aforementioned range listed, please consider follow up with your Primary Care Provider.  LABS:   Your provider has requested that you go to the basement level for lab work before leaving today. Press "B" on the elevator. The lab is located at the first door on the left as you exit the elevator.  HEALTHCARE LAWS AND MY CHART RESULTS: Due to recent changes in healthcare laws, you may see the results of your imaging and laboratory studies on MyChart before your provider has had a chance to review them.   We understand that in some cases there may be results that are confusing or concerning to you. Not all laboratory results come back in the same time frame and the provider may be waiting for multiple results in order to interpret others.  Please give [place] 48 hours in order for your provider to thoroughly review all the results before contacting the office for clarification of your results.   MEDICATION  We have sent the following medication to your pharmacy for you to pick up at your convenience:  Anusol rectal suppositories, one at bedtime for 5 days.  OVER THE COUNTER MEDICATION  Please purchase the following medications over the counter and take as directed:   Desitin: Apply a small amount to the external anal area three times a day as needed. Miralax. Dissolve one capful in 8 ounces of water and drink before bed. Benefiber- 1 tablespoon daily.  Call our office if the rectal bleeding worsens.  Please follow up in 4-6 weeks with Dr. Loletha Carrow.  It was great seeing you today!  Thank you for entrusting me with your care and choosing [facility].  Noralyn Pick, CRNP

## 2020-10-27 ENCOUNTER — Other Ambulatory Visit: Payer: Self-pay | Admitting: Nurse Practitioner

## 2020-10-27 DIAGNOSIS — K625 Hemorrhage of anus and rectum: Secondary | ICD-10-CM

## 2020-10-27 NOTE — Progress Notes (Signed)
Lab orders for CBC placed in Epic. A reminder letter has been mailed to patient to come by the [place] office around 11/10/20 for his lab work.

## 2020-10-27 NOTE — Progress Notes (Signed)
I called the patient and spoke to him and his wife.  I explained Dr. Loletha Carrow' recommendations to resume Eliquis now, he will start a slow release iron tablet OTC once daily.  He will ask his pharmacist for assistance if he cannot easily locate this over-the-counter.  He will start MiraLAX 1/2-1 capful daily to avoid constipation.  He is aware to come to our lab in 2 weeks to have a CBC repeated.  He will call our office if his rectal bleeding recurs.  I also discussed Dr. Loletha Carrow' recommendations for the patient to schedule a colonoscopy to rule out radiation proctitis which possibly could be treated with APC at [facility].  The patient stated he will further discuss scheduling a colonoscopy with his wife and he would like time to think about this.  We will further discuss his decision regarding colonoscopy when I call him in 2 weeks with his lab results.  Claiborne Billings, can you please enter a CBC order to be completed in 2 weeks.  As noted above, I called the patient and he is aware to present to our lab for this test in 2 weeks.

## 2020-10-27 NOTE — Progress Notes (Signed)
____________________________________________________________  Attending physician addendum:  Thank you for sending this case to me. I have reviewed the entire note and agree with the plan.  Hgb 11.8 - difficult to know how much is due to bleeding vs CKD. He can resume Eliquis now and needs to take a once daily slow release iron for 4 weeks.  If it causes worsened constipation, then add a half-capful of miralax once daily.  Needs CBC in 2 weeks. This may be radiation proctitis, and I think he should have a colonoscopy with me at [facility] in case that is found and APC treatment needed. I understand from your note he is reluctant to do that.  Should he change his mind, and certainly if he continues to have this degree of bleeding after resuming Eliquis and/or if there is worsening anemia, then I hope he will reconsider.  Wilfrid Lund, MD  ____________________________________________________________

## 2020-11-10 ENCOUNTER — Other Ambulatory Visit (INDEPENDENT_AMBULATORY_CARE_PROVIDER_SITE_OTHER): Payer: Medicare HMO

## 2020-11-10 DIAGNOSIS — K625 Hemorrhage of anus and rectum: Secondary | ICD-10-CM

## 2020-11-10 LAB — CBC
HCT: 35.1 % — ABNORMAL LOW (ref 39.0–52.0)
Hemoglobin: 12 g/dL — ABNORMAL LOW (ref 13.0–17.0)
MCHC: 34.3 g/dL (ref 30.0–36.0)
MCV: 83.2 fl (ref 78.0–100.0)
Platelets: 196 10*3/uL (ref 150.0–400.0)
RBC: 4.21 Mil/uL — ABNORMAL LOW (ref 4.22–5.81)
RDW: 15.8 % — ABNORMAL HIGH (ref 11.5–15.5)
WBC: 6.6 10*3/uL (ref 4.0–10.5)

## 2020-11-19 DIAGNOSIS — I10 Essential (primary) hypertension: Secondary | ICD-10-CM | POA: Diagnosis not present

## 2020-11-19 DIAGNOSIS — C61 Malignant neoplasm of prostate: Secondary | ICD-10-CM | POA: Diagnosis not present

## 2020-11-19 DIAGNOSIS — N189 Chronic kidney disease, unspecified: Secondary | ICD-10-CM | POA: Diagnosis not present

## 2020-11-19 DIAGNOSIS — I2510 Atherosclerotic heart disease of native coronary artery without angina pectoris: Secondary | ICD-10-CM | POA: Diagnosis not present

## 2020-11-19 DIAGNOSIS — E7800 Pure hypercholesterolemia, unspecified: Secondary | ICD-10-CM | POA: Diagnosis not present

## 2020-11-19 DIAGNOSIS — I213 ST elevation (STEMI) myocardial infarction of unspecified site: Secondary | ICD-10-CM | POA: Diagnosis not present

## 2020-11-19 DIAGNOSIS — K219 Gastro-esophageal reflux disease without esophagitis: Secondary | ICD-10-CM | POA: Diagnosis not present

## 2020-11-19 DIAGNOSIS — N183 Chronic kidney disease, stage 3 unspecified: Secondary | ICD-10-CM | POA: Diagnosis not present

## 2020-11-27 NOTE — Progress Notes (Signed)
Cardiology Office Note   Date:  11/30/2020   ID:  Tommy Dyer, DOB April 26, 1936, MRN [id number]  PCP:  [name], MD  Cardiologist:   Tiasha Helvie Martinique, MD   Chief Complaint  Patient presents with  . Hypertension      History of Present Illness: Tommy Dyer is a 85 y.o. male who presents for follow up CAD and atrial fibrillation. He has a PMH ofessential hypertension, STEMI June 2021 (proximal LAD 40%, mid LAD 80%, second marginal 100% with DES x1, LVEF 55-60%), HLD, nephrolithiasis, stage IV CKD, and prostate cancer.  He presented to [facility] 6/18/2021for evaluation of chest pain. He presented to the cardiac Cath Lab and received DES x1 to the OM which was occluded. Recommendation for DAPT x1 year. He also had an 80% stenosis in the mid LAD and would be a candidate for PCI if he continued to have angina. Medical therapy was optimized.  Echocardiogram 03/14/2020 showed LVEF 55 to 60%, G1 DD. Started on metoprolol XL, continue amlodipine, Plavix, aspirin.  He presented to the emergency department on 03/26/2020 with dizziness.  His EKG showed atrial fibrillation with RVR at rate of 131 new onset.  He was seen in the emergency department.  Plavix was started in place of Brilinta and Eliquis was started at 2.5 mg twice daily.  He was on Toprol. He was rate controlled and fluctuating between sinus rhythm and atrial fibrillation.  Also noted to have AKI with serum creatinine of 2.86 with a baseline around 2.5.    On follow up today he is doing well from a cardiac standpoint. Denies any chest pain. No palpitations. Has [facility] Dr Santiago Bumpers [facility]. Patient states they told him kidney function was stable. Notes his irbesartan dose was increased. Is monitoring his BP at home and is has been well controlled. Did have some rectal bleeding. Seen by GI. They felt this was minor and Eliquis continued. No further bleeding.    Past Medical History:  Diagnosis Date  .  Arthritis   . CAD (coronary artery disease)    a. 02/2020: STEMI with DES to 100% 2nd Mrg stenosis. Residual 80% mid-LAD stenosis with consideration of staged PCI recommended.    . Gastric ulcer   . GERD (gastroesophageal reflux disease)   . History of kidney stones   . Hypercholesterolemia   . Hypertension   . Kidney stones   . Osteoporosis   . Prostate cancer ([place])   . STEMI (ST elevation myocardial infarction) [facility])     Past Surgical History:  Procedure Laterality Date  . colonscopy    . CORONARY/GRAFT ACUTE MI REVASCULARIZATION N/A 03/13/2020   Procedure: CORONARY/GRAFT ACUTE MI REVASCULARIZATION;  Surgeon: Dyer, Arnetta Odeh M, MD;  Location: [facility];  Service: Cardiovascular;  Laterality: N/A;  . EXTRACORPOREAL SHOCK WAVE LITHOTRIPSY Right 06/21/2018   Procedure: RIGHT EXTRACORPOREAL SHOCK WAVE LITHOTRIPSY (ESWL);  Surgeon: Ardis Hughs, MD;  Location: [facility];  Service: Urology;  Laterality: Right;  . LEFT HEART CATH AND CORONARY ANGIOGRAPHY N/A 03/13/2020   Procedure: LEFT HEART CATH AND CORONARY ANGIOGRAPHY;  Surgeon: Dyer, Atul Delucia M, MD;  Location: [place] [facility];  Service: Cardiovascular;  Laterality: N/A;  . PROSTATE BIOPSY    . PROSTATE BIOPSY       Current Outpatient Medications  Medication Sig Dispense Refill  . acetaminophen (TYLENOL) 500 MG tablet Take 1,000 mg by mouth every 6 (six) hours as needed for headache (pain).    Marland Kitchen  apixaban (ELIQUIS) 2.5 MG TABS tablet Take 2.5 mg by mouth 2 (two) times daily. 1 Tablet Twice Daily    . atorvastatin (LIPITOR) 80 MG tablet Take 1 tablet (80 mg total) by mouth daily. 90 tablet 3  . Carboxymethylcellulose Sodium (REFRESH TEARS OP) Place 1 drop into both eyes daily as needed (dry eyes).     . clopidogrel (PLAVIX) 75 MG tablet Take 1 tablet (75 mg total) by mouth daily. 90 tablet 3  . Famotidine (PEPCID AC PO) Take 1 tablet by mouth daily.    . fluticasone (FLONASE) 50 MCG/ACT nasal spray Place 1 spray into both  nostrils daily as needed for allergies or rhinitis.     . hydrocortisone (ANUSOL-HC) 25 MG suppository Place 1 suppository (25 mg total) rectally at bedtime. Use for 5 days. 5 suppository 0  . irbesartan (AVAPRO) 150 MG tablet 1 tablet    . Lidocaine-Glycerin (PREPARATION H EX) Place 1 application rectally daily as needed (hemorrhoids/pain/itching).     . meclizine (ANTIVERT) 25 MG tablet Take 25 mg by mouth daily as needed for dizziness.    . metoprolol succinate (TOPROL-XL) 50 MG 24 hr tablet Take 1 tablet (50 mg total) by mouth daily. Take with or immediately following a meal. 90 tablet 1  . nitroGLYCERIN (NITROSTAT) 0.4 MG SL tablet Place 1 tablet (0.4 mg total) under the tongue every 5 (five) minutes x 3 doses as needed for chest pain. 25 tablet 2  . tamsulosin (FLOMAX) 0.4 MG CAPS capsule Take 0.4 mg by mouth. 1 Tablet Daily     No current facility-administered medications for this visit.    Allergies:   Patient has no known allergies.    Social History:  The patient  reports that he quit smoking about 17 years ago. He has a 20.00 pack-year smoking history. He has never used smokeless tobacco. He reports that he does not drink alcohol and does not use drugs.   Family History:  The patient's family history includes Breast cancer in his maternal grandmother; Cancer in his mother; Diabetes in his father and mother; High blood pressure in his father and mother.    ROS:  Please see the history of present illness.   Otherwise, review of systems are positive for none.   All other systems are reviewed and negative.    PHYSICAL EXAM: VS:  BP 130/80   Pulse 89   Ht 6' (1.829 m)   Wt 196 lb (88.9 kg)   SpO2 97%   BMI 26.58 kg/m  , BMI Body mass index is 26.58 kg/m. GEN: Well nourished, well developed, in no acute distress  HEENT: normal  Neck: no JVD, carotid bruits, or masses Cardiac: RRR; no murmurs, rubs, or gallops,no edema  Respiratory:  clear to auscultation bilaterally, normal  work of breathing GI: soft, nontender, nondistended, + BS MS: no deformity or atrophy  Skin: warm and dry, no rash Neuro:  Strength and sensation are intact Psych: euthymic mood, full affect   EKG:  EKG is not ordered today.   Recent Labs: 05/12/2020: ALT 36 10/26/2020: BUN 28; Creatinine, Ser 2.04; Potassium 3.8; Sodium 138 11/10/2020: Hemoglobin 12.0; Platelets 196.0    Lipid Panel    Component Value Date/Time   CHOL 124 05/12/2020 0843   TRIG 91 05/12/2020 0843   HDL 27 (L) 05/12/2020 0843   CHOLHDL 4.6 05/12/2020 0843   CHOLHDL 7.3 03/14/2020 0414   VLDL 24 03/14/2020 0414   LDLCALC 79 05/12/2020 0843  Wt Readings from Last 3 Encounters:  11/30/20 196 lb (88.9 kg)  10/26/20 197 lb (89.4 kg)  06/24/20 192 lb (87.1 kg)      Other studies Reviewed: Additional studies/ records that were reviewed today include:   TTE Result date: 03/14/20 LVEF 55-60%, LV normal fxn, no WMA, grade I diastolic dysfxn, RV systolic fxn normal, mild-mod AV sclerosis  [place] angio/PCI Result date: 03/13/20 CORONARY/GRAFT ACUTE MI REVASCULARIZATION  LEFT HEART CATH AND CORONARY ANGIOGRAPHY  Conclusion    Prox LAD lesion is 40% stenosed.  Mid LAD lesion is 80% stenosed.  2nd Mrg lesion is 100% stenosed.  Post intervention, there is a 0% residual stenosis.  A drug-eluting stent was successfully placed using a STENT RESOLUTE ONYX [id number].  LV end diastolic pressure is mildly elevated.   1. 2 vessel obstructive CAD    -80% mid LAD    -100% second OM 2. Elevated LVEDP 23 mm Hg 3. Successful PCI of the second OM with DES x 1  Plan: DAPT for one year. Hydrate. Monitor renal function closely. Depending on recovery of renal function consider staged PCI of the mid LAD. Will assess LV function by Echo.      ASSESSMENT AND PLAN:  1.  Atrial fibrillation-New onset in July 2021. Converted to NSR. No recurrent symptoms.  This patients CHA2DS2-VASc Score and unadjusted Ischemic  Stroke Rate (% per year) is equal to 4.8 % stroke rate/year from a score of 4(HTN,MI/PAD/Aortic Plaque, Age) Continue  Eliquis, metoprolol Avoid triggers caffeine and EtOH etc.  2. CAD s/p lateral STEMI n/18/21-with emergent stenting of the first OM.  LVEF 55-60%.He does have residual disease with 80% mid LAD. This segment of the vessel is quite tortuous. He is not having angina on medical therapy.  Continue Plavix for one year from stent- may discontinue after June 18. Continue  Eliquis, metoprolol succinate, nitroglycerin Heart healthy low-sodium diet Stay active.   3. Essential hypertension-BP todayis well controlled.   Continuemetoprolol and irbesartan Heart healthy low-sodium diet   4. Hyperlipidemia-LDL improved from 140 to 79  on Atorvastatin 80 mg daily.  Was previously on fenofibrate but this was discontinued due to CKD.  Continue current therapy  5. CKD stage IV. Followed by nephrology   Current medicines are reviewed at length with the patient today.  The patient does not have concerns regarding medicines.  The following changes have been made:  no change  Labs/ tests ordered today include:   No orders of the defined types were placed in this encounter.    Disposition:   FU with me in 6 months  Signed, Tommy Rhodes Martinique, MD  11/30/2020 12:04 PM    [facility] [address], [place], [place], [address] Phone [phone], Fax [phone]

## 2020-11-30 ENCOUNTER — Encounter: Payer: Self-pay | Admitting: Cardiology

## 2020-11-30 ENCOUNTER — Other Ambulatory Visit: Payer: Self-pay

## 2020-11-30 ENCOUNTER — Ambulatory Visit: Payer: Medicare HMO | Admitting: Cardiology

## 2020-11-30 VITALS — BP 130/80 | HR 89 | Ht 72.0 in | Wt 196.0 lb

## 2020-11-30 DIAGNOSIS — E7800 Pure hypercholesterolemia, unspecified: Secondary | ICD-10-CM | POA: Diagnosis not present

## 2020-11-30 DIAGNOSIS — N184 Chronic kidney disease, stage 4 (severe): Secondary | ICD-10-CM

## 2020-11-30 DIAGNOSIS — I480 Paroxysmal atrial fibrillation: Secondary | ICD-10-CM | POA: Diagnosis not present

## 2020-11-30 DIAGNOSIS — I25118 Atherosclerotic heart disease of native coronary artery with other forms of angina pectoris: Secondary | ICD-10-CM | POA: Diagnosis not present

## 2020-11-30 DIAGNOSIS — I10 Essential (primary) hypertension: Secondary | ICD-10-CM

## 2020-11-30 NOTE — Patient Instructions (Signed)
After June 18 you can stop clopidogrel.   Continue to take Eliquis.

## 2020-12-07 DIAGNOSIS — E7800 Pure hypercholesterolemia, unspecified: Secondary | ICD-10-CM | POA: Diagnosis not present

## 2020-12-07 DIAGNOSIS — I2510 Atherosclerotic heart disease of native coronary artery without angina pectoris: Secondary | ICD-10-CM | POA: Diagnosis not present

## 2020-12-07 DIAGNOSIS — N184 Chronic kidney disease, stage 4 (severe): Secondary | ICD-10-CM | POA: Diagnosis not present

## 2020-12-07 DIAGNOSIS — I10 Essential (primary) hypertension: Secondary | ICD-10-CM | POA: Diagnosis not present

## 2020-12-07 DIAGNOSIS — I480 Paroxysmal atrial fibrillation: Secondary | ICD-10-CM | POA: Diagnosis not present

## 2020-12-07 DIAGNOSIS — C61 Malignant neoplasm of prostate: Secondary | ICD-10-CM | POA: Diagnosis not present

## 2020-12-21 DIAGNOSIS — R7989 Other specified abnormal findings of blood chemistry: Secondary | ICD-10-CM | POA: Diagnosis not present

## 2020-12-22 DIAGNOSIS — K219 Gastro-esophageal reflux disease without esophagitis: Secondary | ICD-10-CM | POA: Diagnosis not present

## 2020-12-22 DIAGNOSIS — I10 Essential (primary) hypertension: Secondary | ICD-10-CM | POA: Diagnosis not present

## 2020-12-22 DIAGNOSIS — I480 Paroxysmal atrial fibrillation: Secondary | ICD-10-CM | POA: Diagnosis not present

## 2020-12-22 DIAGNOSIS — C61 Malignant neoplasm of prostate: Secondary | ICD-10-CM | POA: Diagnosis not present

## 2020-12-22 DIAGNOSIS — I2510 Atherosclerotic heart disease of native coronary artery without angina pectoris: Secondary | ICD-10-CM | POA: Diagnosis not present

## 2020-12-22 DIAGNOSIS — E7800 Pure hypercholesterolemia, unspecified: Secondary | ICD-10-CM | POA: Diagnosis not present

## 2020-12-22 DIAGNOSIS — N184 Chronic kidney disease, stage 4 (severe): Secondary | ICD-10-CM | POA: Diagnosis not present

## 2020-12-22 DIAGNOSIS — I213 ST elevation (STEMI) myocardial infarction of unspecified site: Secondary | ICD-10-CM | POA: Diagnosis not present

## 2020-12-24 DIAGNOSIS — I10 Essential (primary) hypertension: Secondary | ICD-10-CM | POA: Diagnosis not present

## 2020-12-30 ENCOUNTER — Telehealth: Payer: Self-pay | Admitting: Cardiology

## 2020-12-30 NOTE — Telephone Encounter (Signed)
Pt c/o Shortness Of Breath: STAT if SOB developed within the last 24 hours or pt is noticeably SOB on the phone  1. Are you currently SOB (can you hear that pt is SOB on the phone)? Yes  2. How long have you been experiencing SOB? Around a week and its getting worse  3. Are you SOB when sitting or when up moving around? Moving around  4. Are you currently experiencing any other symptoms? Trouble with using restrooms

## 2020-12-30 NOTE — Telephone Encounter (Signed)
Spoke with patient of Dr. Martinique who reports shortness of breath for about 1 week, shortness of breath worsened some today. He reports indigestion. He thinks he is losing weight.   He saw PCP a few weeks ago -- his PCP asked him about bleeding issues -- Hgb 10.3 on 12/21/20 per chart review Patient reports darker stool than normal today, no bright red blood  Patient reports energy level is OK - no change with this  He does not report issues with AFib lately BP 126/74, 128/70, 133/78, 145/82, 138/83  He was not audibly short of breath on the phone  Routed to Dr. Cheri Kearns LPN to review

## 2020-12-31 NOTE — Telephone Encounter (Signed)
Looks like Hgb has dropped. He should have this evaluated by PCP. Need to make sure he isn't losing blood in stool. Anemia could cause dyspnea. When we saw last month was doing well from a cardiac standpoint. If symptoms persist despite improvement in Hgb let us know  Hamdan Toscano Martinique MD, [facility]

## 2020-12-31 NOTE — Telephone Encounter (Signed)
Spoke to patient Dr.Jordan's advice given.Stated he will call his PCP.

## 2021-01-01 DIAGNOSIS — N184 Chronic kidney disease, stage 4 (severe): Secondary | ICD-10-CM | POA: Diagnosis not present

## 2021-01-01 DIAGNOSIS — D638 Anemia in other chronic diseases classified elsewhere: Secondary | ICD-10-CM | POA: Diagnosis not present

## 2021-01-04 DIAGNOSIS — D638 Anemia in other chronic diseases classified elsewhere: Secondary | ICD-10-CM | POA: Diagnosis not present

## 2021-01-12 DIAGNOSIS — R310 Gross hematuria: Secondary | ICD-10-CM | POA: Diagnosis not present

## 2021-01-12 DIAGNOSIS — N5201 Erectile dysfunction due to arterial insufficiency: Secondary | ICD-10-CM | POA: Diagnosis not present

## 2021-01-12 DIAGNOSIS — C61 Malignant neoplasm of prostate: Secondary | ICD-10-CM | POA: Diagnosis not present

## 2021-01-22 DIAGNOSIS — I10 Essential (primary) hypertension: Secondary | ICD-10-CM | POA: Diagnosis not present

## 2021-01-27 DIAGNOSIS — K8020 Calculus of gallbladder without cholecystitis without obstruction: Secondary | ICD-10-CM | POA: Diagnosis not present

## 2021-01-27 DIAGNOSIS — N200 Calculus of kidney: Secondary | ICD-10-CM | POA: Diagnosis not present

## 2021-01-27 DIAGNOSIS — R310 Gross hematuria: Secondary | ICD-10-CM | POA: Diagnosis not present

## 2021-01-27 DIAGNOSIS — K5790 Diverticulosis of intestine, part unspecified, without perforation or abscess without bleeding: Secondary | ICD-10-CM | POA: Diagnosis not present

## 2021-01-29 DIAGNOSIS — D638 Anemia in other chronic diseases classified elsewhere: Secondary | ICD-10-CM | POA: Diagnosis not present

## 2021-01-29 DIAGNOSIS — N184 Chronic kidney disease, stage 4 (severe): Secondary | ICD-10-CM | POA: Diagnosis not present

## 2021-02-01 DIAGNOSIS — R310 Gross hematuria: Secondary | ICD-10-CM | POA: Diagnosis not present

## 2021-02-01 DIAGNOSIS — C61 Malignant neoplasm of prostate: Secondary | ICD-10-CM | POA: Diagnosis not present

## 2021-02-01 DIAGNOSIS — N200 Calculus of kidney: Secondary | ICD-10-CM | POA: Diagnosis not present

## 2021-02-25 ENCOUNTER — Telehealth: Payer: Self-pay | Admitting: Cardiology

## 2021-02-25 NOTE — Telephone Encounter (Signed)
Per Dr Martinique note 11/30/20-- patient can stop Clopidogrel after Mar 13, 2021 , but continue taking Eliquis 2.5 mg twice a day .  RN informed patient of Dr Martinique recommendation from office note. Patient voiced understanding.   F/u 6 month appointment schedule for 07/05/21   letter mailed with appointment

## 2021-02-25 NOTE — Telephone Encounter (Signed)
Pt c/o medication issue:  1. Name of Medication: clopidogrel (PLAVIX) 75 MG tablet  2. How are you currently taking this medication (dosage and times per day)? As prescribed  3. Are you having a reaction (difficulty breathing--STAT)? No  4. What is your medication issue? Pt is calling to confirm that his suppose to stop this medication.He stated that when he saw dr.jordan back in June he was told to stop.

## 2021-03-02 DIAGNOSIS — C61 Malignant neoplasm of prostate: Secondary | ICD-10-CM | POA: Diagnosis not present

## 2021-03-02 DIAGNOSIS — D638 Anemia in other chronic diseases classified elsewhere: Secondary | ICD-10-CM | POA: Diagnosis not present

## 2021-03-02 DIAGNOSIS — N184 Chronic kidney disease, stage 4 (severe): Secondary | ICD-10-CM | POA: Diagnosis not present

## 2021-03-15 DIAGNOSIS — I213 ST elevation (STEMI) myocardial infarction of unspecified site: Secondary | ICD-10-CM | POA: Diagnosis not present

## 2021-03-15 DIAGNOSIS — K219 Gastro-esophageal reflux disease without esophagitis: Secondary | ICD-10-CM | POA: Diagnosis not present

## 2021-03-15 DIAGNOSIS — C61 Malignant neoplasm of prostate: Secondary | ICD-10-CM | POA: Diagnosis not present

## 2021-03-15 DIAGNOSIS — D638 Anemia in other chronic diseases classified elsewhere: Secondary | ICD-10-CM | POA: Diagnosis not present

## 2021-03-15 DIAGNOSIS — I480 Paroxysmal atrial fibrillation: Secondary | ICD-10-CM | POA: Diagnosis not present

## 2021-03-15 DIAGNOSIS — I2510 Atherosclerotic heart disease of native coronary artery without angina pectoris: Secondary | ICD-10-CM | POA: Diagnosis not present

## 2021-03-15 DIAGNOSIS — I10 Essential (primary) hypertension: Secondary | ICD-10-CM | POA: Diagnosis not present

## 2021-03-15 DIAGNOSIS — N189 Chronic kidney disease, unspecified: Secondary | ICD-10-CM | POA: Diagnosis not present

## 2021-03-15 DIAGNOSIS — E7800 Pure hypercholesterolemia, unspecified: Secondary | ICD-10-CM | POA: Diagnosis not present

## 2021-03-22 DIAGNOSIS — E785 Hyperlipidemia, unspecified: Secondary | ICD-10-CM | POA: Diagnosis not present

## 2021-03-22 DIAGNOSIS — C61 Malignant neoplasm of prostate: Secondary | ICD-10-CM | POA: Diagnosis not present

## 2021-03-22 DIAGNOSIS — I2510 Atherosclerotic heart disease of native coronary artery without angina pectoris: Secondary | ICD-10-CM | POA: Diagnosis not present

## 2021-03-22 DIAGNOSIS — N184 Chronic kidney disease, stage 4 (severe): Secondary | ICD-10-CM | POA: Diagnosis not present

## 2021-03-22 DIAGNOSIS — I480 Paroxysmal atrial fibrillation: Secondary | ICD-10-CM | POA: Diagnosis not present

## 2021-03-22 DIAGNOSIS — I129 Hypertensive chronic kidney disease with stage 1 through stage 4 chronic kidney disease, or unspecified chronic kidney disease: Secondary | ICD-10-CM | POA: Diagnosis not present

## 2021-03-26 ENCOUNTER — Other Ambulatory Visit: Payer: Self-pay | Admitting: General Practice

## 2021-03-26 NOTE — Telephone Encounter (Signed)
Prescription refill request for Eliquis received. Indication: Last office visit:3/22 Scr:2.0 Age: 85 Weight:88.9 kg  Prescription refilled

## 2021-04-12 ENCOUNTER — Ambulatory Visit: Payer: Medicare HMO | Admitting: Gastroenterology

## 2021-04-12 ENCOUNTER — Encounter: Payer: Self-pay | Admitting: Gastroenterology

## 2021-04-12 VITALS — BP 164/80 | HR 84 | Ht 69.5 in | Wt 193.4 lb

## 2021-04-12 DIAGNOSIS — R195 Other fecal abnormalities: Secondary | ICD-10-CM

## 2021-04-12 DIAGNOSIS — Z7902 Long term (current) use of antithrombotics/antiplatelets: Secondary | ICD-10-CM

## 2021-04-12 DIAGNOSIS — R634 Abnormal weight loss: Secondary | ICD-10-CM

## 2021-04-12 DIAGNOSIS — K625 Hemorrhage of anus and rectum: Secondary | ICD-10-CM | POA: Diagnosis not present

## 2021-04-12 DIAGNOSIS — K5909 Other constipation: Secondary | ICD-10-CM | POA: Diagnosis not present

## 2021-04-12 NOTE — Progress Notes (Signed)
[facility] Progress Note  Chief Complaint: Rectal bleeding  Subjective  History: Tommy Dyer was last seen by me in clinic May 2020 with rectal bleeding, prior history of XRT for prostate cancer.  Suspected to be either hemorrhoidal or from radiation proctitis.  Colonoscopy recommended, patient elected not to do so at that point. Seen in clinic 10/26/2020 by our APP for rectal bleeding, constipation with straining. Non-STEMI with DES placement June 2021, A. fib diagnosed July 2021.  Eliquis was held, continued Plavix. Patient had hemoglobin 11.8, I felt it was difficult to know how much was from bleeding versus CKD.  My recommendation was to resume Eliquis, take a once daily slow release iron, repeat CBC in 2 weeks and have a colonoscopy at [facility] in case APC treatment needed, should radiation proctitis be discovered.  Patient wanted to consider it further and has not returned to clinic until today.  Tommy Dyer was referred back by primary care because he had an episode of blood mixed with the bowel movement a little over 2 weeks ago, and he also reportedly had heme positive stool at Dr. Lina Sar office.  He reports a weight loss of about 10 to 12 pounds within the last year without trying to do so.  His appetite remains good.  He still has constipation with straining at times, will occasionally take milk of magnesia.  ROS: Cardiovascular:  no chest pain Respiratory: no dyspnea  The patient's Past Medical, Family and Social History were reviewed and are on file in the EMR.  Objective:  Med list reviewed  Current Outpatient Medications:    acetaminophen (TYLENOL) 500 MG tablet, Take 1,000 mg by mouth every 6 (six) hours as needed for headache (pain)., Disp: , Rfl:    apixaban (ELIQUIS) 2.5 MG TABS tablet, TAKE 1 TABLET (2.5 MG TOTAL) BY MOUTH 2 (TWO) TIMES DAILY., Disp: 60 tablet, Rfl: 5   atorvastatin (LIPITOR) 80 MG tablet, Take 1 tablet (80 mg total) by mouth daily., Disp: 90  tablet, Rfl: 3   Carboxymethylcellulose Sodium (REFRESH TEARS OP), Place 1 drop into both eyes daily as needed (dry eyes). , Disp: , Rfl:    clopidogrel (PLAVIX) 75 MG tablet, Take 1 tablet (75 mg total) by mouth daily., Disp: 90 tablet, Rfl: 3   Famotidine (PEPCID AC PO), Take 1 tablet by mouth daily., Disp: , Rfl:    ferrous sulfate 325 (65 FE) MG tablet, Take 325 mg by mouth daily with breakfast., Disp: , Rfl:    fluticasone (FLONASE) 50 MCG/ACT nasal spray, Place 1 spray into both nostrils daily as needed for allergies or rhinitis. , Disp: , Rfl:    irbesartan (AVAPRO) 150 MG tablet, 1 tablet, Disp: , Rfl:    Lidocaine-Glycerin (PREPARATION H EX), Place 1 application rectally daily as needed (hemorrhoids/pain/itching). , Disp: , Rfl:    meclizine (ANTIVERT) 25 MG tablet, Take 25 mg by mouth daily as needed for dizziness., Disp: , Rfl:    metoprolol succinate (TOPROL-XL) 50 MG 24 hr tablet, Take 1 tablet (50 mg total) by mouth daily. Take with or immediately following a meal., Disp: 90 tablet, Rfl: 1   nitroGLYCERIN (NITROSTAT) 0.4 MG SL tablet, Place 1 tablet (0.4 mg total) under the tongue every 5 (five) minutes x 3 doses as needed for chest pain., Disp: 25 tablet, Rfl: 2   tamsulosin (FLOMAX) 0.4 MG CAPS capsule, Take 0.4 mg by mouth. 1 Tablet Daily, Disp: , Rfl:    Vital signs in last 24  hrs: Vitals:   04/12/21 1445  BP: (!) 164/80  Pulse: 84   Wt Readings from Last 3 Encounters:  04/12/21 193 lb 6 oz (87.7 kg)  11/30/20 196 lb (88.9 kg)  10/26/20 197 lb (89.4 kg)    Physical Exam  Well-appearing, no muscle wasting HEENT: sclera anicteric, oral mucosa moist without lesions Neck: supple, no thyromegaly, JVD or lymphadenopathy Cardiac: RRR without murmurs, S1S2 heard, no peripheral edema Pulm: clear to auscultation bilaterally, normal RR and effort noted Abdomen: soft, no tenderness, with active bowel sounds. No guarding or palpable hepatosplenomegaly. Skin; warm and dry, no  jaundice or rash Rectal normal perianal exam, normal sphincter tone, no tenderness, fissure or palpable internal lesions.  Formed stool in rectal vault. Labs:   ___________________________________________ Radiologic studies:   ____________________________________________ Other:   _____________________________________________ Assessment & Plan  Assessment: Encounter Diagnoses  Name Primary?   Rectal bleeding Yes   Heme positive stool    Long term (current) use of antithrombotics/antiplatelets    Chronic constipation    Weight loss     Rectal bleeding, intermittent and at least since I last saw him over 2 years ago.  It seems likely related to constipation and straining with anal bleeding or perhaps hemorrhoidal in nature.  Previous XRT, so consider radiation proctitis. Difficult to say if his weight loss is related to way of concern for neoplasia. I still feel he should have a colonoscopy, but he is still reluctant to do so.  Plan: Colace stool softener 200 mg nightly MiraLAX half capful in the morning, dose adjust as needed. Toilet routine at mealtimes, avoid sitting for prolonged periods and straining.  Elevate feet slightly with bowel movements.  He would like to give further consideration of the colonoscopy, which is what he has been telling me for over 2 years.  I am hopeful he will decide to do so.  If so, it should be done in the hospital outpatient endoscopy lab in case he has radiation proctitis that requires APC treatment.   32 minutes were spent on this encounter (including chart review, history/exam, counseling/coordination of care, and documentation) > 50% of that time was spent on counseling and coordination of care.  Topics discussed included: See above.  [name]

## 2021-04-12 NOTE — Patient Instructions (Signed)
If you are age 83 or older, your body mass index should be between 23-30. Your Body mass index is 28.15 kg/m. If this is out of the aforementioned range listed, please consider follow up with your Primary Care Provider.  If you are age 12 or younger, your body mass index should be between 19-25. Your Body mass index is 28.15 kg/m. If this is out of the aformentioned range listed, please consider follow up with your Primary Care Provider.   __________________________________________________________  The [facility] providers would like to encourage you to use [facility] to communicate with providers for non-urgent requests or questions.  Due to long hold times on the telephone, sending your provider a message by [facility] may be a faster and more efficient way to get a response.  Please allow 48 business hours for a response.  Please remember that this is for non-urgent requests.    Take Docusate 200mg  daily at bedtime.  Take 1/2 capful of Miralax every morning  Call us when you are ready to schedule a colonoscopy

## 2021-04-23 DIAGNOSIS — I10 Essential (primary) hypertension: Secondary | ICD-10-CM | POA: Diagnosis not present

## 2021-06-01 DIAGNOSIS — K219 Gastro-esophageal reflux disease without esophagitis: Secondary | ICD-10-CM | POA: Diagnosis not present

## 2021-06-01 DIAGNOSIS — N184 Chronic kidney disease, stage 4 (severe): Secondary | ICD-10-CM | POA: Diagnosis not present

## 2021-06-01 DIAGNOSIS — I480 Paroxysmal atrial fibrillation: Secondary | ICD-10-CM | POA: Diagnosis not present

## 2021-06-01 DIAGNOSIS — I10 Essential (primary) hypertension: Secondary | ICD-10-CM | POA: Diagnosis not present

## 2021-06-01 DIAGNOSIS — E7800 Pure hypercholesterolemia, unspecified: Secondary | ICD-10-CM | POA: Diagnosis not present

## 2021-06-01 DIAGNOSIS — D638 Anemia in other chronic diseases classified elsewhere: Secondary | ICD-10-CM | POA: Diagnosis not present

## 2021-06-01 DIAGNOSIS — I2510 Atherosclerotic heart disease of native coronary artery without angina pectoris: Secondary | ICD-10-CM | POA: Diagnosis not present

## 2021-06-09 DIAGNOSIS — I2510 Atherosclerotic heart disease of native coronary artery without angina pectoris: Secondary | ICD-10-CM | POA: Diagnosis not present

## 2021-06-09 DIAGNOSIS — I10 Essential (primary) hypertension: Secondary | ICD-10-CM | POA: Diagnosis not present

## 2021-06-09 DIAGNOSIS — N184 Chronic kidney disease, stage 4 (severe): Secondary | ICD-10-CM | POA: Diagnosis not present

## 2021-06-09 DIAGNOSIS — R7309 Other abnormal glucose: Secondary | ICD-10-CM | POA: Diagnosis not present

## 2021-06-09 DIAGNOSIS — E7800 Pure hypercholesterolemia, unspecified: Secondary | ICD-10-CM | POA: Diagnosis not present

## 2021-06-09 DIAGNOSIS — R7303 Prediabetes: Secondary | ICD-10-CM | POA: Diagnosis not present

## 2021-06-09 DIAGNOSIS — D649 Anemia, unspecified: Secondary | ICD-10-CM | POA: Diagnosis not present

## 2021-06-09 DIAGNOSIS — I480 Paroxysmal atrial fibrillation: Secondary | ICD-10-CM | POA: Diagnosis not present

## 2021-06-23 ENCOUNTER — Other Ambulatory Visit: Payer: Self-pay | Admitting: Cardiology

## 2021-07-02 NOTE — Progress Notes (Signed)
Cardiology Office Note   Date:  07/05/2021   ID:  Tommy Dyer, DOB 04-30-1936, MRN [id number]  PCP:  Tommy Carol, MD  Cardiologist:   Tommy Duclos Martinique, MD   Chief Complaint  Patient presents with   Atrial Fibrillation   Coronary Artery Disease       History of Present Illness: Tommy Dyer is a 85 y.o. male who presents for follow up CAD and atrial fibrillation. He has a PMH of  essential hypertension, STEMI June 2021 (proximal LAD 40%, mid LAD 80%, second marginal 100% with DES x1, LVEF 55-60%), HLD, nephrolithiasis, stage IV CKD, and prostate cancer.   He presented to [facility] 03/13/2020 for evaluation of chest pain.  He presented to the cardiac Cath Lab and received DES x1 to the OM which was occluded.  Recommendation for DAPT x1 year. He also had an 80% stenosis in the mid LAD and would be a candidate for PCI if he continued to have angina. Medical therapy was optimized.   Echocardiogram 03/14/2020 showed LVEF 55 to 60%, G1 DD.  Started on metoprolol XL, continue amlodipine, Plavix, aspirin.    He presented to the emergency department on 03/26/2020 with dizziness.  His EKG showed atrial fibrillation with RVR at rate of 131 new onset.  He was seen in the emergency department.  Plavix was started in place of Brilinta and Eliquis was started at 2.5 mg twice daily.  He was on Toprol. He was rate controlled and fluctuating between sinus rhythm and atrial fibrillation.  Also noted to have AKI with serum creatinine of 2.86 with a baseline around 2.5.    On follow up today he is doing well from a cardiac standpoint. Denies any chest pain. No palpitations. Has seen Tommy Dyer with [facility]. Patient states they told him kidney function was stable. Reports his BP at home has been well controlled but he gets nervous when he goes to the doctor. No bleeding.    Past Medical History:  Diagnosis Date   Arthritis    CAD (coronary artery disease)    a. 02/2020: STEMI with  DES to 100% 2nd Mrg stenosis. Residual 80% mid-LAD stenosis with consideration of staged PCI recommended.     Gastric ulcer    GERD (gastroesophageal reflux disease)    History of kidney stones    Hypercholesterolemia    Hypertension    Kidney stones    Osteoporosis    Prostate cancer (Tommy Dyer)    STEMI (ST elevation myocardial infarction) [place])     Past Surgical History:  Procedure Laterality Date   colonscopy     CORONARY/GRAFT ACUTE MI REVASCULARIZATION N/A 03/13/2020   Procedure: CORONARY/GRAFT ACUTE MI REVASCULARIZATION;  Surgeon: Dyer, Tyerra Loretto M, MD;  Location: [place] [facility];  Service: Cardiovascular;  Laterality: N/A;   EXTRACORPOREAL SHOCK WAVE LITHOTRIPSY Right 06/21/2018   Procedure: RIGHT EXTRACORPOREAL SHOCK WAVE LITHOTRIPSY (ESWL);  Surgeon: Tommy Hughs, MD;  Location: [facility];  Service: Urology;  Laterality: Right;   LEFT HEART CATH AND CORONARY ANGIOGRAPHY N/A 03/13/2020   Procedure: LEFT HEART CATH AND CORONARY ANGIOGRAPHY;  Surgeon: Dyer, Josy Peaden M, MD;  Location: [place] [facility];  Service: Cardiovascular;  Laterality: N/A;   PROSTATE BIOPSY     PROSTATE BIOPSY       Current Outpatient Medications  Medication Sig Dispense Refill   acetaminophen (TYLENOL) 500 MG tablet Take 1,000 mg by mouth every 6 (six) hours as needed for headache (pain).  apixaban (ELIQUIS) 2.5 MG TABS tablet TAKE 1 TABLET (2.5 MG TOTAL) BY MOUTH 2 (TWO) TIMES DAILY. 60 tablet 5   atorvastatin (LIPITOR) 80 MG tablet TAKE 1 TABLET EVERY DAY 90 tablet 1   Carboxymethylcellulose Sodium (REFRESH TEARS OP) Place 1 drop into both eyes daily as needed (dry eyes).      Famotidine (PEPCID AC PO) Take 1 tablet by mouth daily.     ferrous sulfate 325 (65 FE) MG tablet Take 325 mg by mouth daily with breakfast.     fluticasone (FLONASE) 50 MCG/ACT nasal spray Place 1 spray into both nostrils daily as needed for allergies or rhinitis.      irbesartan (AVAPRO) 150 MG tablet 1 tablet      Lidocaine-Glycerin (PREPARATION H EX) Place 1 application rectally daily as needed (hemorrhoids/pain/itching).      meclizine (ANTIVERT) 25 MG tablet Take 25 mg by mouth daily as needed for dizziness.     metoprolol succinate (TOPROL-XL) 50 MG 24 hr tablet Take 1 tablet (50 mg total) by mouth daily. Take with or immediately following a meal. 90 tablet 1   tamsulosin (FLOMAX) 0.4 MG CAPS capsule Take 0.4 mg by mouth. 1 Tablet Daily     nitroGLYCERIN (NITROSTAT) 0.4 MG SL tablet Place 1 tablet (0.4 mg total) under the tongue every 5 (five) minutes x 3 doses as needed for chest pain. 25 tablet 2   No current facility-administered medications for this visit.    Allergies:   Patient has no known allergies.    Social History:  The patient  reports that he quit smoking about 17 years ago. His smoking use included cigarettes. He has a 20.00 pack-year smoking history. He has never used smokeless tobacco. He reports that he does not drink alcohol and does not use drugs.   Family History:  The patient's family history includes Breast cancer in his maternal grandmother; Cancer in his mother; Diabetes in his father and mother; High blood pressure in his father and mother.    ROS:  Please see the history of present illness.   Otherwise, review of systems are positive for none.   All other systems are reviewed and negative.    PHYSICAL EXAM: VS:  BP (!) 154/88   Pulse 78   Ht 5' 9.5" (1.765 Dyer)   Wt 198 lb 3.2 oz (89.9 kg)   SpO2 100%   BMI 28.85 kg/Dyer  , BMI Body mass index is 28.85 kg/Dyer. GEN: Well nourished, well developed, in no acute distress  HEENT: normal  Neck: no JVD, carotid bruits, or masses Cardiac: RRR; no murmurs, rubs, or gallops,no edema  Respiratory:  clear to auscultation bilaterally, normal work of breathing GI: soft, nontender, nondistended, + BS MS: no deformity or atrophy  Skin: warm and dry, no rash Neuro:  Strength and sensation are intact Psych: euthymic mood, full  affect   EKG:  EKG is ordered today. NSR with normal Ecg. I have personally reviewed and interpreted this study.    Recent Labs: 10/26/2020: BUN 28; Creatinine, Ser 2.04; Potassium 3.8; Sodium 138 11/10/2020: Hemoglobin 12.0; Platelets 196.0    Lipid Panel    Component Value Date/Time   CHOL 124 05/12/2020 0843   TRIG 91 05/12/2020 0843   HDL 27 (L) 05/12/2020 0843   CHOLHDL 4.6 05/12/2020 0843   CHOLHDL 7.3 03/14/2020 0414   VLDL 24 03/14/2020 0414   LDLCALC 79 05/12/2020 0843   Dated 06/09/21: A1c 6.4%. cholesterol 90, HDL 27. LDL 44. Triglycerides  100. Hgb 9.9   Wt Readings from Last 3 Encounters:  07/05/21 198 lb 3.2 oz (89.9 kg)  04/12/21 193 lb 6 oz (87.7 kg)  11/30/20 196 lb (88.9 kg)      Other studies Reviewed: Additional studies/ records that were reviewed today include:   TTE Result date: 03/14/20 LVEF 55-60%, LV normal fxn, no WMA, grade I diastolic dysfxn, RV systolic fxn normal, mild-mod AV sclerosis   [place] angio/PCI Result date: 03/13/20 CORONARY/GRAFT ACUTE MI REVASCULARIZATION  LEFT HEART CATH AND CORONARY ANGIOGRAPHY  Conclusion    Prox LAD lesion is 40% stenosed. Mid LAD lesion is 80% stenosed. 2nd Mrg lesion is 100% stenosed. Post intervention, there is a 0% residual stenosis. A drug-eluting stent was successfully placed using a STENT RESOLUTE ONYX [id number]. LV end diastolic pressure is mildly elevated.   1. 2 vessel obstructive CAD    -80% mid LAD    -100% second OM 2. Elevated LVEDP 23 mm Hg 3. Successful PCI of the second OM with DES x 1   Plan: DAPT for one year. Hydrate. Monitor renal function closely. Depending on recovery of renal function consider staged PCI of the mid LAD. Will assess LV function by Echo.   ASSESSMENT AND PLAN:  1.  Atrial fibrillation-New onset in July 2021. Converted to NSR. No recurrent symptoms.  This patients CHA2DS2-VASc Score and unadjusted Ischemic Stroke Rate (% per year) is equal to 4.8 % stroke  rate/year from a score of 4(HTN,MI/PAD/Aortic Plaque, Age) Continue  Eliquis, metoprolol Avoid triggers caffeine and EtOH etc.   2. CAD s/p lateral STEMI 03/13/20-with emergent stenting of the first OM in June 2021. LVEF 55-60%. He does have residual disease with 80% mid LAD. This segment of the vessel is quite tortuous. He is not having angina on medical therapy.  Now off Plavix.  Continue  Eliquis, metoprolol succinate, nitroglycerin Heart healthy low-sodium diet Stay active.    3. Essential hypertension-BP today is well controlled.    Continue metoprolol and irbesartan Heart healthy low-sodium diet   4. Hyperlipidemia-LDL improved from 140 to 79  on Atorvastatin 80 mg daily.  Was previously on fenofibrate but this was discontinued due to CKD.  Continue current therapy  5. CKD stage IV. Followed by nephrology   Current medicines are reviewed at length with the patient today.  The patient does not have concerns regarding medicines.  The following changes have been made:  no change  Labs/ tests ordered today include:   No orders of the defined types were placed in this encounter.    Disposition:   FU with me in 6 months  Signed, Rosalita Carey Martinique, MD  07/05/2021 10:12 AM    [facility] [address] [place], [place], [address] Phone [phone], Fax [phone]

## 2021-07-05 ENCOUNTER — Encounter: Payer: Self-pay | Admitting: Cardiology

## 2021-07-05 ENCOUNTER — Ambulatory Visit: Payer: Medicare HMO | Admitting: Cardiology

## 2021-07-05 ENCOUNTER — Other Ambulatory Visit: Payer: Self-pay

## 2021-07-05 VITALS — BP 154/88 | HR 78 | Ht 69.5 in | Wt 198.2 lb

## 2021-07-05 DIAGNOSIS — E7800 Pure hypercholesterolemia, unspecified: Secondary | ICD-10-CM

## 2021-07-05 DIAGNOSIS — I25118 Atherosclerotic heart disease of native coronary artery with other forms of angina pectoris: Secondary | ICD-10-CM | POA: Diagnosis not present

## 2021-07-05 DIAGNOSIS — I10 Essential (primary) hypertension: Secondary | ICD-10-CM | POA: Diagnosis not present

## 2021-07-05 DIAGNOSIS — N184 Chronic kidney disease, stage 4 (severe): Secondary | ICD-10-CM

## 2021-07-05 DIAGNOSIS — I480 Paroxysmal atrial fibrillation: Secondary | ICD-10-CM

## 2021-07-05 MED ORDER — NITROGLYCERIN 0.4 MG SL SUBL: 0.4000 mg | SUBLINGUAL_TABLET | SUBLINGUAL | 2 refills | Status: DC | PRN

## 2021-07-05 NOTE — Addendum Note (Signed)
Addended by: Kathyrn Lass on: 07/05/2021 10:28 AM   Modules accepted: Orders

## 2021-07-07 ENCOUNTER — Other Ambulatory Visit: Payer: Self-pay

## 2021-07-26 DIAGNOSIS — I10 Essential (primary) hypertension: Secondary | ICD-10-CM | POA: Diagnosis not present

## 2021-08-06 ENCOUNTER — Telehealth: Payer: Self-pay | Admitting: Cardiology

## 2021-08-06 DIAGNOSIS — N200 Calculus of kidney: Secondary | ICD-10-CM | POA: Diagnosis not present

## 2021-08-06 DIAGNOSIS — R8271 Bacteriuria: Secondary | ICD-10-CM | POA: Diagnosis not present

## 2021-08-06 DIAGNOSIS — R310 Gross hematuria: Secondary | ICD-10-CM | POA: Diagnosis not present

## 2021-08-06 NOTE — Telephone Encounter (Signed)
Patient called and wanted to talk with nurse in regards to something personal. Please call back

## 2021-08-06 NOTE — Telephone Encounter (Signed)
Returned call to patient, made him aware of Dr. Doug Sou recommendations. Advised patient to call back to office with any issues, questions, or concerns. Patient verbalized understanding.    Yes OK to hold Eliquis over the weekend. Resume on Monday if better   Tommy Martinique MD, [facility]

## 2021-08-06 NOTE — Telephone Encounter (Signed)
Returned call to patient who states that he has been having some blood in his urine for the last several days. Patient states that he saw his Urologist today who put him on antibiotics and also recommended that he hold his Eliquis over the weekend. Patient would like to know if this would be okay? Advised him I would forward to Dr. Martinique and PharmD for review and advice. Patient verbalized understanding.

## 2021-08-13 ENCOUNTER — Telehealth: Payer: Self-pay | Admitting: Cardiology

## 2021-08-13 NOTE — Telephone Encounter (Signed)
Called patient. Patient made aware of Dr. Doug Sou recommendations. Verbalized understanding. No further questions or concerns expressed at this time.

## 2021-08-13 NOTE — Telephone Encounter (Signed)
Patient called to say that he still got blood in his urine. Calling to see what can be done next, not sure if it has anything to do with medication.

## 2021-08-13 NOTE — Telephone Encounter (Addendum)
Called pt he states yesterday he noticed a darker color to his urine "maybe it's my mind, I don't know, it's not red red but it is deep coloring." Pt states he  he has a renal ultrasound 08/07/2021, they gave him Cephalexin 500 mg twice daily for a UTI, 2 weeks. Pt is wondering is the coloring caused by the Eliquis. Pt aware Dr. Martinique is on the hospital today but the message will be relayed to him and we will get back to him as soon as possible. He verbalized understanding. Pt will call him urologist to update them about what is going on.

## 2021-08-18 DIAGNOSIS — R311 Benign essential microscopic hematuria: Secondary | ICD-10-CM | POA: Diagnosis not present

## 2021-08-18 DIAGNOSIS — R310 Gross hematuria: Secondary | ICD-10-CM | POA: Diagnosis not present

## 2021-08-25 DIAGNOSIS — I10 Essential (primary) hypertension: Secondary | ICD-10-CM | POA: Diagnosis not present

## 2021-08-26 ENCOUNTER — Telehealth: Payer: Self-pay | Admitting: Cardiology

## 2021-08-26 NOTE — Telephone Encounter (Signed)
Patient calling the office for samples of medication:   1.  What medication and dosage are you requesting samples for? Eliquis  2.  Are you currently out of this medication?  In the donut hole   

## 2021-08-26 NOTE — Telephone Encounter (Signed)
Spoke to patient's wife Eliquis 2.5 mg samples left at [employer] office front desk.

## 2021-09-09 ENCOUNTER — Emergency Department (HOSPITAL_COMMUNITY)
Admission: EM | Admit: 2021-09-09 | Discharge: 2021-09-09 | Disposition: A | Discharge status: Home / Self Care | Payer: Medicare HMO | Attending: Emergency Medicine | Admitting: Emergency Medicine

## 2021-09-09 ENCOUNTER — Encounter (HOSPITAL_COMMUNITY): Payer: Self-pay

## 2021-09-09 ENCOUNTER — Other Ambulatory Visit: Payer: Self-pay

## 2021-09-09 ENCOUNTER — Emergency Department (HOSPITAL_COMMUNITY): Payer: Medicare HMO

## 2021-09-09 DIAGNOSIS — Z951 Presence of aortocoronary bypass graft: Secondary | ICD-10-CM | POA: Diagnosis not present

## 2021-09-09 DIAGNOSIS — Z8546 Personal history of malignant neoplasm of prostate: Secondary | ICD-10-CM | POA: Diagnosis not present

## 2021-09-09 DIAGNOSIS — U071 COVID-19: Secondary | ICD-10-CM | POA: Insufficient documentation

## 2021-09-09 DIAGNOSIS — Z87891 Personal history of nicotine dependence: Secondary | ICD-10-CM | POA: Diagnosis not present

## 2021-09-09 DIAGNOSIS — R000 Tachycardia, unspecified: Secondary | ICD-10-CM | POA: Diagnosis not present

## 2021-09-09 DIAGNOSIS — I10 Essential (primary) hypertension: Secondary | ICD-10-CM | POA: Diagnosis not present

## 2021-09-09 DIAGNOSIS — Z7901 Long term (current) use of anticoagulants: Secondary | ICD-10-CM | POA: Diagnosis not present

## 2021-09-09 DIAGNOSIS — Z79899 Other long term (current) drug therapy: Secondary | ICD-10-CM | POA: Diagnosis not present

## 2021-09-09 DIAGNOSIS — I2510 Atherosclerotic heart disease of native coronary artery without angina pectoris: Secondary | ICD-10-CM | POA: Diagnosis not present

## 2021-09-09 DIAGNOSIS — R059 Cough, unspecified: Secondary | ICD-10-CM | POA: Diagnosis not present

## 2021-09-09 LAB — CBC WITH DIFFERENTIAL/PLATELET
Abs Immature Granulocytes: 0.01 10*3/uL (ref 0.00–0.07)
Basophils Absolute: 0.1 10*3/uL (ref 0.0–0.1)
Basophils Relative: 1 %
Eosinophils Absolute: 0 10*3/uL (ref 0.0–0.5)
Eosinophils Relative: 0 %
HCT: 32.1 % — ABNORMAL LOW (ref 39.0–52.0)
Hemoglobin: 10.6 g/dL — ABNORMAL LOW (ref 13.0–17.0)
Immature Granulocytes: 0 %
Lymphocytes Relative: 22 %
Lymphs Abs: 1.4 10*3/uL (ref 0.7–4.0)
MCH: 22.9 pg — ABNORMAL LOW (ref 26.0–34.0)
MCHC: 33 g/dL (ref 30.0–36.0)
MCV: 69.5 fL — ABNORMAL LOW (ref 80.0–100.0)
Monocytes Absolute: 0.9 10*3/uL (ref 0.1–1.0)
Monocytes Relative: 15 %
Neutro Abs: 3.9 10*3/uL (ref 1.7–7.7)
Neutrophils Relative %: 62 %
Platelets: 176 10*3/uL (ref 150–400)
RBC: 4.62 MIL/uL (ref 4.22–5.81)
RDW: 20.2 % — ABNORMAL HIGH (ref 11.5–15.5)
WBC: 6.3 10*3/uL (ref 4.0–10.5)
nRBC: 0 % (ref 0.0–0.2)

## 2021-09-09 LAB — RESP PANEL BY RT-PCR (FLU A&B, COVID) ARPGX2
Influenza A by PCR: NEGATIVE
Influenza B by PCR: NEGATIVE
SARS Coronavirus 2 by RT PCR: POSITIVE — AB

## 2021-09-09 LAB — BASIC METABOLIC PANEL
Anion gap: 6 (ref 5–15)
BUN: 33 mg/dL — ABNORMAL HIGH (ref 8–23)
CO2: 19 mmol/L — ABNORMAL LOW (ref 22–32)
Calcium: 9.3 mg/dL (ref 8.9–10.3)
Chloride: 109 mmol/L (ref 98–111)
Creatinine, Ser: 2.39 mg/dL — ABNORMAL HIGH (ref 0.61–1.24)
GFR, Estimated: 26 mL/min — ABNORMAL LOW (ref >60)
Glucose, Bld: 92 mg/dL (ref 70–99)
Potassium: 4.5 mmol/L (ref 3.5–5.1)
Sodium: 134 mmol/L — ABNORMAL LOW (ref 135–145)

## 2021-09-09 MED ORDER — ACETAMINOPHEN 325 MG PO TABS
650.0000 mg | ORAL_TABLET | Freq: Once | ORAL | Status: AC
  Administered 2021-09-09: 650 mg via ORAL
  Filled 2021-09-09: qty 2

## 2021-09-09 MED ORDER — MOLNUPIRAVIR EUA 200MG CAPSULE
4.0000 | ORAL_CAPSULE | Freq: Two times a day (BID) | ORAL | 0 refills | Status: AC
Start: 2021-09-09 — End: 2021-09-14

## 2021-09-09 NOTE — ED Provider Notes (Signed)
Emergency Medicine Provider Triage Evaluation Note  Tommy Dyer , a 85 y.o. male  was evaluated in triage.  Pt complains of cough, congestion, frontal headache.  He states he started feeling poorly yesterday.  He did go to a funeral a few days before him.  He states he has had both flu and COVID shots.  He reports compliance with all of his medications.  Review of Systems  Positive: Cough, congestion, cold and flulike symptoms Negative: See above  Physical Exam  BP (!) 180/88 (BP Location: Left Arm)    Pulse (!) 109    Temp (!) 100.9 F (38.3 C)    Resp 20    Ht 5' 9.5" (1.765 m)    Wt 90 kg    SpO2 97%    BMI 28.88 kg/m  Gen:   Awake, no distress   Resp:  Normal effort  MSK:   Moves extremities without difficulty  Other:  Normal speech, no distress.   Medical Decision Making  Medically screening exam initiated at 4:55 PM.  Appropriate orders placed.  Tommy Dyer was informed that the remainder of the evaluation will be completed by another provider, this initial triage assessment does not replace that evaluation, and the importance of remaining in the ED until their evaluation is complete.  Patient with cough, congestion, cold and flulike symptoms.  Here he is febrile and tachycardic.  We will send flu and COVID testing.  With his tachycardia and his multiple comorbid conditions will obtain a EKG and chest x-ray in addition.   Lorin Glass, PA-C 09/09/21 1656    Wyvonnia Dusky, MD 09/09/21 [phone]

## 2021-09-09 NOTE — ED Triage Notes (Signed)
Pt arrives POV for eval of cough, congestion, cold/flu like sx. States started feeling bad yesteday. Denies known sick contacts. Denies CP/SOB

## 2021-09-09 NOTE — ED Provider Notes (Signed)
[facility] EMERGENCY DEPARTMENT Provider Note  CSN: [id number] Arrival date & time: 09/09/21 1341    History Chief Complaint  Patient presents with   URI    Tommy Dyer is a 85 y.o. male with history of CAD, CKD, afib on Eliquis and prostate cancer in remission reports 2 days of nasal congestion, productive cough, general malaise and fever. He has had flu and Covid vaccines.    Past Medical History:  Diagnosis Date   Arthritis    CAD (coronary artery disease)    a. 02/2020: STEMI with DES to 100% 2nd Mrg stenosis. Residual 80% mid-LAD stenosis with consideration of staged PCI recommended.     Gastric ulcer    GERD (gastroesophageal reflux disease)    History of kidney stones    Hypercholesterolemia    Hypertension    Kidney stones    Osteoporosis    Prostate cancer ([place])    STEMI (ST elevation myocardial infarction) [facility])     Past Surgical History:  Procedure Laterality Date   colonscopy     CORONARY/GRAFT ACUTE MI REVASCULARIZATION N/A 03/13/2020   Procedure: CORONARY/GRAFT ACUTE MI REVASCULARIZATION;  Surgeon: Martinique, Peter M, MD;  Location: [facility];  Service: Cardiovascular;  Laterality: N/A;   EXTRACORPOREAL SHOCK WAVE LITHOTRIPSY Right 06/21/2018   Procedure: RIGHT EXTRACORPOREAL SHOCK WAVE LITHOTRIPSY (ESWL);  Surgeon: Ardis Hughs, MD;  Location: [facility];  Service: Urology;  Laterality: Right;   LEFT HEART CATH AND CORONARY ANGIOGRAPHY N/A 03/13/2020   Procedure: LEFT HEART CATH AND CORONARY ANGIOGRAPHY;  Surgeon: Martinique, Peter M, MD;  Location: [facility];  Service: Cardiovascular;  Laterality: N/A;   PROSTATE BIOPSY     PROSTATE BIOPSY      Family History  Problem Relation Age of Onset   Cancer Mother        unknown   Diabetes Mother    High blood pressure Mother    Diabetes Father    High blood pressure Father    Breast cancer Maternal Grandmother        breast   Colon cancer Neg Hx    Stomach cancer Neg Hx     Social  History   Tobacco Use   Smoking status: Former    Packs/day: 1.00    Years: 20.00    Pack years: 20.00    Types: Cigarettes    Quit date: 09/27/2003    Years since quitting: 17.9   Smokeless tobacco: Never  Vaping Use   Vaping Use: Never used  Substance Use Topics   Alcohol use: No   Drug use: No     Home Medications Prior to Admission medications   Medication Sig Start Date End Date Taking? Authorizing Provider  acetaminophen (TYLENOL) 500 MG tablet Take 1,000 mg by mouth every 6 (six) hours as needed for headache or moderate pain.   Yes [provider]  apixaban (ELIQUIS) 2.5 MG TABS tablet TAKE 1 TABLET (2.5 MG TOTAL) BY MOUTH 2 (TWO) TIMES DAILY. Patient taking differently: Take 2.5 mg by mouth 2 (two) times daily. 03/26/21  Yes Cleaver, Jossie Ng, NP  atorvastatin (LIPITOR) 80 MG tablet TAKE 1 TABLET EVERY DAY Patient taking differently: Take 80 mg by mouth daily. 06/23/21  Yes Martinique, Peter M, MD  Carboxymethylcellulose Sodium (REFRESH TEARS OP) Place 1 drop into both eyes daily as needed (dry eyes).    Yes [provider]  Famotidine (PEPCID AC PO) Take 1 tablet by mouth daily as needed (heartburn).  Yes [provider]  fluticasone (FLONASE) 50 MCG/ACT nasal spray Place 1 spray into both nostrils daily as needed for allergies or rhinitis.    Yes [provider]  irbesartan (AVAPRO) 150 MG tablet Take 150 mg by mouth daily.   Yes [provider]  Lidocaine-Glycerin (PREPARATION H EX) Place 1 application rectally daily as needed (hemorrhoids/pain/itching).    Yes [provider]  meclizine (ANTIVERT) 25 MG tablet Take 25 mg by mouth daily as needed for dizziness. 03/25/20  Yes [provider]  metoprolol succinate (TOPROL-XL) 50 MG 24 hr tablet Take 1 tablet (50 mg total) by mouth daily. Take with or immediately following a meal. 03/16/20  Yes Strader, [place] M, PA-C  molnupiravir EUA (LAGEVRIO) 200 mg CAPS capsule Take  4 capsules (800 mg total) by mouth 2 (two) times daily for 5 days. 09/09/21 09/14/21 Yes Truddie Hidden, MD  nitroGLYCERIN (NITROSTAT) 0.4 MG SL tablet Place 1 tablet (0.4 mg total) under the tongue every 5 (five) minutes x 3 doses as needed for chest pain. Patient taking differently: Place 0.4 mg under the tongue every 5 (five) minutes as needed for chest pain. 07/05/21  Yes Martinique, Peter M, MD  tamsulosin (FLOMAX) 0.4 MG CAPS capsule Take 0.4 mg by mouth every other day.   Yes [provider]     Allergies    Patient has no known allergies.   Review of Systems   Review of Systems A comprehensive review of systems was completed and negative except as noted in HPI.    Physical Exam BP (!) 168/84    Pulse 97    Temp (!) 100.9 F (38.3 C)    Resp 20    Ht 5' 9.5" (1.765 m)    Wt 90 kg    SpO2 97%    BMI 28.88 kg/m   Physical Exam Vitals and nursing note reviewed.  Constitutional:      Appearance: Normal appearance.  HENT:     Head: Normocephalic and atraumatic.     Nose: Nose normal.     Mouth/Throat:     Mouth: Mucous membranes are moist.  Eyes:     Extraocular Movements: Extraocular movements intact.     Conjunctiva/sclera: Conjunctivae normal.  Cardiovascular:     Rate and Rhythm: Normal rate.  Pulmonary:     Effort: Pulmonary effort is normal.     Breath sounds: Normal breath sounds.  Abdominal:     General: Abdomen is flat.     Palpations: Abdomen is soft.     Tenderness: There is no abdominal tenderness.  Musculoskeletal:        General: No swelling. Normal range of motion.     Cervical back: Neck supple.  Skin:    General: Skin is warm and dry.  Neurological:     General: No focal deficit present.     Mental Status: He is alert.  Psychiatric:        Mood and Affect: Mood normal.     ED Results / Procedures / Treatments   Labs (all labs ordered are listed, but only abnormal results are displayed) Labs Reviewed  RESP PANEL BY RT-PCR (FLU A&B,  COVID) ARPGX2 - Abnormal; Notable for the following components:      Result Value   SARS Coronavirus 2 by RT PCR POSITIVE (*)    All other components within normal limits  BASIC METABOLIC PANEL - Abnormal; Notable for the following components:   Sodium 134 (*)  CO2 19 (*)    BUN 33 (*)    Creatinine, Ser 2.39 (*)    GFR, Estimated 26 (*)    All other components within normal limits  CBC WITH DIFFERENTIAL/PLATELET - Abnormal; Notable for the following components:   Hemoglobin 10.6 (*)    HCT 32.1 (*)    MCV 69.5 (*)    MCH 22.9 (*)    RDW 20.2 (*)    All other components within normal limits    EKG EKG Interpretation  Date/Time:  Thursday September 09 2021 16:52:18 EST Ventricular Rate:  103 PR Interval:  192 QRS Duration: 82 QT Interval:  306 QTC Calculation: 400 R Axis:   19 Text Interpretation: Sinus tachycardia Cannot rule out Anterior infarct , age undetermined Abnormal ECG Since last tracing Sinus tachycardia has replaced Atrial fibrillation Confirmed by Calvert Cantor [phone]) on 09/09/2021 6:28:04 PM  Radiology DG Chest 2 View  Result Date: 09/09/2021 CLINICAL DATA:  Cough EXAM: CHEST - 2 VIEW COMPARISON:  03/26/2020 FINDINGS: Cardiac size is within normal limits. Thoracic aorta is tortuous. There are no signs of pulmonary edema or new focal infiltrates. Increase in AP diameter of chest suggests COPD. There is no pleural effusion or pneumothorax. IMPRESSION: There are no new infiltrates or signs of pulmonary edema. Electronically Signed   By: Elmer Picker M.D.   On: 09/09/2021 17:49    Procedures Procedures  Medications Ordered in the ED Medications  acetaminophen (TYLENOL) tablet 650 mg (650 mg Oral Given 09/09/21 1602)     MDM Rules/Calculators/A&P MDM Patient with URI symptoms, had positive Covid test in triage. EKG shows mild tachycardia. CXR is clear. Baseline Cr from old records in [place] is around 2.0 (last GFR 29). Will check labs to see if he  would be a candidate for Paxlovid. Otherwise he looks well, no distress with normal SpO2 on room air.   ED Course  I have reviewed the triage vital signs and the nursing notes.  Pertinent labs & imaging results that were available during my care of the patient were reviewed by me and considered in my medical decision making (see chart for details).  Clinical Course as of 09/09/21 2135  Thu Sep 09, 2021  2134 CBC is unremarkable. BMP shows CKD about at baseline but too low GFR for Paxlovid. Will Rx molnupiravir. Given instructions for symptomatic care are home. PCP follow up. RTED for any worsening symptoms.  [CS]    Clinical Course User Index [CS] Truddie Hidden, MD    Final Clinical Impression(s) / ED Diagnoses Final diagnoses:  [id number]    Rx / DC Orders ED Discharge Orders          Ordered    molnupiravir EUA (LAGEVRIO) 200 mg CAPS capsule  2 times daily        09/09/21 2133             Truddie Hidden, MD 09/09/21 2135

## 2021-09-09 NOTE — ED Notes (Signed)
Pt verbalized understanding of d/c instructions, meds, and followup care. Denies questions. VSS, no distress noted. Steady gait to exit with all belongings. Walks to lobby with family.

## 2021-09-28 DIAGNOSIS — N281 Cyst of kidney, acquired: Secondary | ICD-10-CM | POA: Diagnosis not present

## 2021-09-28 DIAGNOSIS — N184 Chronic kidney disease, stage 4 (severe): Secondary | ICD-10-CM | POA: Diagnosis not present

## 2021-09-28 DIAGNOSIS — R310 Gross hematuria: Secondary | ICD-10-CM | POA: Diagnosis not present

## 2021-09-28 DIAGNOSIS — C61 Malignant neoplasm of prostate: Secondary | ICD-10-CM | POA: Diagnosis not present

## 2021-09-28 DIAGNOSIS — N200 Calculus of kidney: Secondary | ICD-10-CM | POA: Diagnosis not present

## 2021-10-01 ENCOUNTER — Other Ambulatory Visit: Payer: Self-pay

## 2021-10-01 ENCOUNTER — Telehealth: Payer: Self-pay | Admitting: General Practice

## 2021-10-01 ENCOUNTER — Telehealth: Payer: Self-pay | Admitting: Cardiology

## 2021-10-01 MED ORDER — APIXABAN 2.5 MG PO TABS: 2.5000 mg | ORAL_TABLET | Freq: Two times a day (BID) | ORAL | 1 refills | Status: DC

## 2021-10-01 MED ORDER — APIXABAN 2.5 MG PO TABS: 2.5000 mg | ORAL_TABLET | Freq: Two times a day (BID) | ORAL | 5 refills | Status: DC

## 2021-10-01 MED ORDER — APIXABAN 2.5 MG PO TABS: 2.5000 mg | ORAL_TABLET | Freq: Two times a day (BID) | ORAL | 0 refills | Status: DC

## 2021-10-01 NOTE — Addendum Note (Signed)
Addended by: Betha Loa F on: 10/01/2021 03:25 PM   Modules accepted: Orders

## 2021-10-01 NOTE — Telephone Encounter (Signed)
Patient wants to know why he was only able to get a 30 day supply of eliquis instead of 90 day supply.

## 2021-10-01 NOTE — Telephone Encounter (Signed)
Rx was just refilled for the same way it was filled last. Ok to give 90 days. I sent rx to [facility]. Please let pt know.

## 2021-10-01 NOTE — Telephone Encounter (Signed)
Patient calling the office for samples of medication:   1.  What medication and dosage are you requesting samples for? apixaban (ELIQUIS) 2.5 MG TABS tablet  2.  Are you currently out of this medication? Out of medication

## 2021-10-01 NOTE — Telephone Encounter (Signed)
Spoke with patient and informed him that eliquis refill was sent to pharmacy. Patient stated he needed the prescription sent to mail order, not local pharmacy. This will be corrected today. Two sample boxes of eliquis 2.5 mg provided due to him waiting on the mail order to come.

## 2021-10-01 NOTE — Telephone Encounter (Signed)
Routed to pharmacy team to review request/dose

## 2021-10-01 NOTE — Telephone Encounter (Signed)
Prescription refill request for Eliquis received. Indication:Afib Last office visit:10/22 Scr:2.3 Age: 86 Weight:90 kg  Prescription refilled

## 2021-10-01 NOTE — Telephone Encounter (Signed)
His rx for Eliquis 90 day supply was sent to pharmacy today

## 2021-10-06 DIAGNOSIS — I480 Paroxysmal atrial fibrillation: Secondary | ICD-10-CM | POA: Diagnosis not present

## 2021-10-06 DIAGNOSIS — C61 Malignant neoplasm of prostate: Secondary | ICD-10-CM | POA: Diagnosis not present

## 2021-10-06 DIAGNOSIS — N200 Calculus of kidney: Secondary | ICD-10-CM | POA: Diagnosis not present

## 2021-10-06 DIAGNOSIS — D509 Iron deficiency anemia, unspecified: Secondary | ICD-10-CM | POA: Diagnosis not present

## 2021-10-06 DIAGNOSIS — I129 Hypertensive chronic kidney disease with stage 1 through stage 4 chronic kidney disease, or unspecified chronic kidney disease: Secondary | ICD-10-CM | POA: Diagnosis not present

## 2021-10-06 DIAGNOSIS — E785 Hyperlipidemia, unspecified: Secondary | ICD-10-CM | POA: Diagnosis not present

## 2021-10-06 DIAGNOSIS — N184 Chronic kidney disease, stage 4 (severe): Secondary | ICD-10-CM | POA: Diagnosis not present

## 2021-10-12 ENCOUNTER — Telehealth: Payer: Self-pay

## 2021-10-12 NOTE — Telephone Encounter (Signed)
Eliquis refill already sent to mail order pharmacy.

## 2021-10-14 DIAGNOSIS — I2510 Atherosclerotic heart disease of native coronary artery without angina pectoris: Secondary | ICD-10-CM | POA: Diagnosis not present

## 2021-10-14 DIAGNOSIS — N184 Chronic kidney disease, stage 4 (severe): Secondary | ICD-10-CM | POA: Diagnosis not present

## 2021-10-14 DIAGNOSIS — E7800 Pure hypercholesterolemia, unspecified: Secondary | ICD-10-CM | POA: Diagnosis not present

## 2021-10-14 DIAGNOSIS — I480 Paroxysmal atrial fibrillation: Secondary | ICD-10-CM | POA: Diagnosis not present

## 2021-10-14 DIAGNOSIS — D638 Anemia in other chronic diseases classified elsewhere: Secondary | ICD-10-CM | POA: Diagnosis not present

## 2021-10-14 DIAGNOSIS — I10 Essential (primary) hypertension: Secondary | ICD-10-CM | POA: Diagnosis not present

## 2021-10-14 DIAGNOSIS — K219 Gastro-esophageal reflux disease without esophagitis: Secondary | ICD-10-CM | POA: Diagnosis not present

## 2021-10-14 DIAGNOSIS — I213 ST elevation (STEMI) myocardial infarction of unspecified site: Secondary | ICD-10-CM | POA: Diagnosis not present

## 2021-10-14 DIAGNOSIS — C61 Malignant neoplasm of prostate: Secondary | ICD-10-CM | POA: Diagnosis not present

## 2021-10-19 DIAGNOSIS — H04123 Dry eye syndrome of bilateral lacrimal glands: Secondary | ICD-10-CM | POA: Diagnosis not present

## 2021-10-19 DIAGNOSIS — H02886 Meibomian gland dysfunction of left eye, unspecified eyelid: Secondary | ICD-10-CM | POA: Diagnosis not present

## 2021-10-19 DIAGNOSIS — Z961 Presence of intraocular lens: Secondary | ICD-10-CM | POA: Diagnosis not present

## 2021-10-19 DIAGNOSIS — H02883 Meibomian gland dysfunction of right eye, unspecified eyelid: Secondary | ICD-10-CM | POA: Diagnosis not present

## 2021-10-21 DIAGNOSIS — L299 Pruritus, unspecified: Secondary | ICD-10-CM | POA: Diagnosis not present

## 2021-10-21 DIAGNOSIS — R21 Rash and other nonspecific skin eruption: Secondary | ICD-10-CM | POA: Diagnosis not present

## 2021-11-23 DIAGNOSIS — E7800 Pure hypercholesterolemia, unspecified: Secondary | ICD-10-CM | POA: Diagnosis not present

## 2021-11-23 DIAGNOSIS — I10 Essential (primary) hypertension: Secondary | ICD-10-CM | POA: Diagnosis not present

## 2021-12-01 ENCOUNTER — Telehealth: Payer: Self-pay | Admitting: Cardiology

## 2021-12-01 NOTE — Telephone Encounter (Signed)
Patient called stating he might need a tooth pulled. He is on blood thinners he wants to know what the procedure would be when you are on a blood thinner.  ?

## 2021-12-01 NOTE — Telephone Encounter (Signed)
Returned call to patient-advised once scheduled to request clearance form be faxed to Dr. Doug Sou office to review. ?

## 2022-01-08 NOTE — Progress Notes (Signed)
?  ?Cardiology Office Note ? ? ?Date:  01/13/2022  ? ?ID:  Tommy Dyer, DOB May 28, 1936, MRN [id number] ? ?PCP:  [name], MD  ?Cardiologist:   [name], MD  ? ?Chief Complaint  ?Patient presents with  ? Coronary Artery Disease  ? Atrial Fibrillation  ? ? ? ?  ?History of Present Illness: ?Tommy Dyer is a 86 y.o. male who presents for follow up CAD and atrial fibrillation. He has a PMH of  essential hypertension, STEMI June 2021 (proximal LAD 40%, mid LAD 80%, second marginal 100% with DES x1, LVEF 55-60%), HLD, nephrolithiasis, stage IV CKD, and prostate cancer. ?  ?He presented to [facility]/13/2020 for evaluation of chest pain.  He presented to the cardiac Cath Lab and received DES x1 to the OM which was occluded.  Recommendation for DAPT x1 year. He also had an 80% stenosis in the mid LAD and would be a candidate for PCI if he continued to have angina. Medical therapy was optimized.   Echocardiogram 03/14/2020 showed LVEF 55 to 60%, G1 DD.  Started on metoprolol XL, continue amlodipine, Plavix, aspirin. ?   ?He presented to the emergency department on 03/26/2020 with dizziness.  His EKG showed atrial fibrillation with RVR at rate of 131 new onset.  He was seen in the emergency department.  Plavix was started in place of Brilinta and Eliquis was started at 2.5 mg twice daily.  He was on Toprol. He was rate controlled and fluctuating between sinus rhythm and atrial fibrillation.  Also noted to have AKI with serum creatinine of 2.86 with a baseline around 2.5.   ? ?He was seen in ED in Dec with Covid 19. Treated with molnupiravir. ? ?On follow up today he is doing well from a cardiac standpoint. Denies any chest pain. No palpitations.Sees Dr Santiago Bumpers with [facility]. Patient states they told him kidney function was stable. He denies any chest pain or SOB. Has some minor nose bleeds. Is active walking every day and doing yard work. ? ? ?Past Medical History:  ?Diagnosis Date  ?  Arthritis   ? CAD (coronary artery disease)   ? a. 02/2020: STEMI with DES to 100% 2nd Mrg stenosis. Residual 80% mid-LAD stenosis with consideration of staged PCI recommended.    ? Gastric ulcer   ? GERD (gastroesophageal reflux disease)   ? History of kidney stones   ? Hypercholesterolemia   ? Hypertension   ? Kidney stones   ? Osteoporosis   ? Prostate cancer ([place])   ? STEMI (ST elevation myocardial infarction) ([place])   ? ? ?Past Surgical History:  ?Procedure Laterality Date  ? colonscopy    ? CORONARY/GRAFT ACUTE MI REVASCULARIZATION N/A 03/13/2020  ? Procedure: CORONARY/GRAFT ACUTE MI REVASCULARIZATION;  Surgeon: Martinique, Tullio Chausse M, MD;  Location: [place] [facility];  Service: Cardiovascular;  Laterality: N/A;  ? EXTRACORPOREAL SHOCK WAVE LITHOTRIPSY Right 06/21/2018  ? Procedure: RIGHT EXTRACORPOREAL SHOCK WAVE LITHOTRIPSY (ESWL);  Surgeon: Ardis Hughs, MD;  Location: [facility];  Service: Urology;  Laterality: Right;  ? LEFT HEART CATH AND CORONARY ANGIOGRAPHY N/A 03/13/2020  ? Procedure: LEFT HEART CATH AND CORONARY ANGIOGRAPHY;  Surgeon: Martinique, Amena Dockham M, MD;  Location: [place] [facility];  Service: Cardiovascular;  Laterality: N/A;  ? PROSTATE BIOPSY    ? PROSTATE BIOPSY    ? ? ? ?Current Outpatient Medications  ?Medication Sig Dispense Refill  ? acetaminophen (TYLENOL) 500 MG tablet Take 1,000 mg by mouth every  6 (six) hours as needed for headache or moderate pain.    ? apixaban (ELIQUIS) 2.5 MG TABS tablet Take 1 tablet (2.5 mg total) by mouth 2 (two) times daily. 180 tablet 1  ? atorvastatin (LIPITOR) 80 MG tablet TAKE 1 TABLET EVERY DAY (Patient taking differently: Take 80 mg by mouth daily.) 90 tablet 1  ? Carboxymethylcellulose Sodium (REFRESH TEARS OP) Place 1 drop into both eyes daily as needed (dry eyes).     ? Famotidine (PEPCID AC PO) Take 1 tablet by mouth daily as needed (heartburn).    ? fluticasone (FLONASE) 50 MCG/ACT nasal spray Place 1 spray into both nostrils daily as needed for allergies or  rhinitis.     ? irbesartan (AVAPRO) 150 MG tablet Take 150 mg by mouth daily.    ? Lidocaine-Glycerin (PREPARATION H EX) Place 1 application rectally daily as needed (hemorrhoids/pain/itching).     ? meclizine (ANTIVERT) 25 MG tablet Take 25 mg by mouth daily as needed for dizziness.    ? metoprolol succinate (TOPROL-XL) 50 MG 24 hr tablet Take 1 tablet (50 mg total) by mouth daily. Take with or immediately following a meal. 90 tablet 1  ? nitroGLYCERIN (NITROSTAT) 0.4 MG SL tablet Place 1 tablet (0.4 mg total) under the tongue every 5 (five) minutes x 3 doses as needed for chest pain. (Patient taking differently: Place 0.4 mg under the tongue every 5 (five) minutes as needed for chest pain.) 25 tablet 2  ? tamsulosin (FLOMAX) 0.4 MG CAPS capsule Take 0.4 mg by mouth every other day.    ? ?No current facility-administered medications for this visit.  ? ? ?Allergies:   Patient has no known allergies.  ? ? ?Social History:  The patient  reports that he quit smoking about 18 years ago. His smoking use included cigarettes. He has a 20.00 pack-year smoking history. He has never used smokeless tobacco. He reports that he does not drink alcohol and does not use drugs.  ? ?Family History:  The patient's family history includes Breast cancer in his maternal grandmother; Cancer in his mother; Diabetes in his father and mother; High blood pressure in his father and mother.  ? ? ?ROS:  Please see the history of present illness.   Otherwise, review of systems are positive for none.   All other systems are reviewed and negative.  ? ? ?PHYSICAL EXAM: ?VS:  BP 132/72   Pulse 92   Ht 6' (1.829 m)   Wt 200 lb 9.6 oz (91 kg)   SpO2 96%   BMI 27.21 kg/m?  , BMI Body mass index is 27.21 kg/m?. ?GEN: Well nourished, well developed, in no acute distress  ?HEENT: normal  ?Neck: no JVD, carotid bruits, or masses ?Cardiac: RRR; no murmurs, rubs, or gallops,no edema  ?Respiratory:  clear to auscultation bilaterally, normal work of  breathing ?GI: soft, nontender, nondistended, + BS ?MS: no deformity or atrophy  ?Skin: warm and dry, no rash ?Neuro:  Strength and sensation are intact ?Psych: euthymic mood, full affect ? ? ?EKG:  EKG is not ordered today.  ? ? ? ?Recent Labs: ?09/09/2021: BUN 33; Creatinine, Ser 2.39; Hemoglobin 10.6; Platelets 176; Potassium 4.5; Sodium 134  ? ? ?Lipid Panel ?   ?Component Value Date/Time  ? CHOL 124 05/12/2020 0843  ? TRIG 91 05/12/2020 0843  ? HDL 27 (L) 05/12/2020 0843  ? CHOLHDL 4.6 05/12/2020 0843  ? CHOLHDL 7.3 03/14/2020 0414  ? VLDL 24 03/14/2020 0414  ? [place]  79 05/12/2020 0843  ? ?Dated 06/09/21: A1c 6.4%. cholesterol 90, HDL 27. LDL 44. Triglycerides 100. Hgb 9.9  ? ?Wt Readings from Last 3 Encounters:  ?01/13/22 200 lb 9.6 oz (91 kg)  ?09/09/21 198 lb 6.6 oz (90 kg)  ?07/05/21 198 lb 3.2 oz (89.9 kg)  ?  ? ? ?Other studies Reviewed: ?Additional studies/ records that were reviewed today include:  ? ?TTE ?Result date: 03/14/20 ?LVEF 55-60%, LV normal fxn, no WMA, grade I diastolic dysfxn, RV systolic fxn normal, mild-mod AV sclerosis ?  ?[place] angio/PCI ?Result date: 03/13/20 ?CORONARY/GRAFT ACUTE MI REVASCULARIZATION  ?LEFT HEART CATH AND CORONARY ANGIOGRAPHY  ?Conclusion ? ?  ?Prox LAD lesion is 40% stenosed. ?Mid LAD lesion is 80% stenosed. ?2nd Mrg lesion is 100% stenosed. ?Post intervention, there is a 0% residual stenosis. ?A drug-eluting stent was successfully placed using a STENT RESOLUTE ONYX [id number]. ?LV end diastolic pressure is mildly elevated. ?  ?1. 2 vessel obstructive CAD ?   -80% mid LAD ?   -100% second OM ?2. Elevated LVEDP 23 mm Hg ?3. Successful PCI of the second OM with DES x 1 ?  ?Plan: DAPT for one year. Hydrate. Monitor renal function closely. Depending on recovery of renal function consider staged PCI of the mid LAD. Will assess LV function by Echo.  ? ?ASSESSMENT AND PLAN: ? ?1.  Atrial fibrillation-New onset in July 2021. Converted to NSR. No recurrent symptoms.  This  patients CHA2DS2-VASc Score and unadjusted Ischemic Stroke Rate (% per year) is equal to 4.8 % stroke rate/year from a score of 4(HTN,MI/PAD/Aortic Plaque, Age) ?Continue  Eliquis, metoprolol ?  ?2. CAD s/p lateral STEMI 6/1

## 2022-01-13 ENCOUNTER — Encounter: Payer: Self-pay | Admitting: Cardiology

## 2022-01-13 ENCOUNTER — Ambulatory Visit: Payer: Medicare HMO | Admitting: Cardiology

## 2022-01-13 VITALS — BP 132/72 | HR 92 | Ht 72.0 in | Wt 200.6 lb

## 2022-01-13 DIAGNOSIS — N184 Chronic kidney disease, stage 4 (severe): Secondary | ICD-10-CM | POA: Diagnosis not present

## 2022-01-13 DIAGNOSIS — I25118 Atherosclerotic heart disease of native coronary artery with other forms of angina pectoris: Secondary | ICD-10-CM

## 2022-01-13 DIAGNOSIS — I480 Paroxysmal atrial fibrillation: Secondary | ICD-10-CM | POA: Diagnosis not present

## 2022-01-13 DIAGNOSIS — I10 Essential (primary) hypertension: Secondary | ICD-10-CM | POA: Diagnosis not present

## 2022-01-13 DIAGNOSIS — E7800 Pure hypercholesterolemia, unspecified: Secondary | ICD-10-CM

## 2022-01-13 NOTE — Patient Instructions (Signed)
Medication Instructions:  ?Continue same medications ?*If you need a refill on your cardiac medications before your next appointment, please call your pharmacy* ? ? ?Lab Work: ?None ordered ? ? ?Testing/Procedures: ?None ordered ? ? ?Follow-Up: ?At [facility], you and your health needs are our priority.  As part of our continuing mission to provide you with exceptional heart care, we have created designated Provider Care Teams.  These Care Teams include your primary Cardiologist (physician) and Advanced Practice Providers (APPs -  Physician Assistants and Nurse Practitioners) who all work together to provide you with the care you need, when you need it. ? ?We recommend signing up for the patient portal called "MyChart".  Sign up information is provided on this After Visit Summary.  MyChart is used to connect with patients for Virtual Visits (Telemedicine).  Patients are able to view lab/test results, encounter notes, upcoming appointments, etc.  Non-urgent messages can be sent to your provider as well.   ?To learn more about what you can do with MyChart, go to [email or web address].   ? ?Your next appointment:  6 months  ?  ? ?The format for your next appointment: Office ? ? ?Provider:  Dr.Jordan's PA  ? ? ?Important Information About Sugar ? ? ? ? ? ? ?

## 2022-01-24 ENCOUNTER — Encounter: Payer: Self-pay | Admitting: Podiatry

## 2022-01-24 ENCOUNTER — Telehealth: Payer: Self-pay | Admitting: Cardiology

## 2022-01-24 ENCOUNTER — Ambulatory Visit (INDEPENDENT_AMBULATORY_CARE_PROVIDER_SITE_OTHER): Payer: Medicare HMO | Admitting: Podiatry

## 2022-01-24 DIAGNOSIS — L600 Ingrowing nail: Secondary | ICD-10-CM | POA: Diagnosis not present

## 2022-01-24 NOTE — Telephone Encounter (Signed)
Pt states that he has an appt with the foot doctor today and he mat possible have to have an ingrown toe nail cut out. Pt would like to know if this is okay for him to have done being that he is on a blood thinner. Please advise ? ?Pt would like a callback by 12 noon if possible being that his appt is at 1:00 pm ?

## 2022-01-24 NOTE — Telephone Encounter (Signed)
No active clearances in the chart presently. Will route to callback team to contact Dr. Mellody Drown office to find out if anything needed from our end. Will remove this msg from preop APP pool, but will revisit if formal clearance requested. ?

## 2022-01-24 NOTE — Telephone Encounter (Signed)
Called and spoke to patient. Patient states he is not sure what Dr Paulla Dolly will be doing today about the ingrown toe nail. ? ?RN informed patient. Dr Paulla Dolly 's office usually would send a cardiac clearance -needing  to stop Eliquis for any procedures.  ?Patient is aware Dr Martinique is out of office today . - will send message  for review ?

## 2022-01-24 NOTE — Telephone Encounter (Signed)
Dr.Regal's office called left message on triage nurse line to call back. ?

## 2022-01-24 NOTE — Patient Instructions (Signed)

## 2022-01-25 NOTE — Telephone Encounter (Signed)
I returned a call to Dr. Paulla Dolly office in regard to needing request for clearance. See previous notes from yesterday. I did leave detailed message as to what information is needed for a clearance request to be faxed to our office, though I did also state could call back as to s.w the pre op team.   ?

## 2022-01-25 NOTE — Progress Notes (Signed)
Subjective:  ? ?Patient ID: Tommy Dyer, male   DOB: 86 y.o.   MRN: [id number]  ? ?HPI ?Patient presents stating that he has an ingrown toenail that is been sore on his right big toe.  He is taking a blood thinner but does not seem to bruise excessively and it is painful and hard for him to wear shoe gear ? ? ?ROS ? ? ?   ?Objective:  ?Physical Exam  ?Neurovascular status intact with patient noted to have incurvated medial border of the right hallux with the rest of the nail intact with also a small amount of scar tissue within the nailbed.  No erythema no edema no drainage noted ? ?   ?Assessment:  ?Ingrown toenail deformity right hallux medial border ? ?   ?Plan:  ?Discussed blood thinner he does not seem to bleed excessively and I think this should be a fairly simple procedure with minimal bleeding and he wants to get it done read and then signed consent form and I infiltrated the right hallux 60 mg like Marcaine mixture sterile prep done using sterile instrumentation remove the medial border exposed matrix applied phenol 3 applications 30 seconds followed by alcohol lavage sterile dressing gave instructions on soaks and to leave dressing on at least 24 hours but take it off earlier if throbbing were to occur.  If there is any trouble or problems with bleeding he is to let us know immediately and we will decompress ?   ? ? ?

## 2022-01-25 NOTE — Telephone Encounter (Signed)
Should be ok

## 2022-01-25 NOTE — Telephone Encounter (Signed)
Received a call from [place] office patient had procedure yesterday.She will fax note to [place]. ?

## 2022-01-25 NOTE — Telephone Encounter (Signed)
Sheryl w/ Dr Peter Martinique [phone]) and wanted to know if patient had a nail procedure on 01/24/22,patient is on a blood thinner(Eliquis). ? Returned call back to triage nurse informing that patient did have procedure and will fax [phone] when completed, verbalized understanding. ?

## 2022-01-27 ENCOUNTER — Telehealth: Payer: Self-pay | Admitting: *Deleted

## 2022-01-27 NOTE — Telephone Encounter (Signed)
Patient calling for instructions on caring for toe following ingrown a few days ago.he said that it was still a little painful,is still blooding some applying neosporin and soaking in warm epsom salts. Please advise. ?  ?

## 2022-01-27 NOTE — Telephone Encounter (Signed)
Patient aware.

## 2022-01-27 NOTE — Telephone Encounter (Signed)
Continue same treatment. Normal to still have some oozing

## 2022-01-27 NOTE — Telephone Encounter (Signed)
Is clearance still needed or has the procedure been completed? ?

## 2022-01-27 NOTE — Telephone Encounter (Signed)
Procedure has been completed.

## 2022-02-04 NOTE — Telephone Encounter (Signed)
PT REQUESTING ANTIBIOTIC TO BE SENT TO PHARMACY, [facility] ?

## 2022-02-07 ENCOUNTER — Telehealth: Payer: Self-pay

## 2022-02-07 NOTE — Telephone Encounter (Signed)
? ?  Pre-operative Risk Assessment  ?  ?Patient Name: Tommy Dyer  ?DOB: 03-08-1936 ?MRN: [id number]  ? ? ? ?Request for Surgical Clearance   ? ?Procedure:   Cleaning, Radiographs, Fillings/crowns/bridges, Extractions ? ?Date of Surgery:  Clearance TBD                              ?   ?Surgeon:  [place]  ?Surgeon's Group or Practice Name:  Dr. Magdalen Spatz  ?Phone number:  [phone] ?Fax number:  [phone] ?  ?Type of Clearance Requested:   ?- Medical  ?- Pharmacy:  Hold Apixaban (Eliquis) instructions needed  ?  ?Type of Anesthesia:   Local anesthetic  ?  ?Additional requests/questions:  Does this patient need antibiotics? ?Anesthetic restrictions, is epinephrine ok to use? ? ?Signed, ?Jacqulynn Cadet   ?02/07/2022, 3:19 PM  ? ?

## 2022-02-08 NOTE — Telephone Encounter (Addendum)
Dental office is closed for the night. I will fax notes over asking for clarification.  ? ?NEED TO CLARIFICATION: ?THE CARDIOLOGIST WILL NOT PROVIDE A BLANKET TYPE CLEARANCE DUE THE PT IS ON A BLOOD THINNER (ELIQUIS). IF THIS A MULT Tx PLAN FOR THE PT. WE WILL ASSESS FOR THE PROCEDURE TO BE DONE AT THIS TIME. IF MULTI Tx PLAN WILL NEED CLEARANCE REQUEST WHEN READY FOR THE NEXT Tx. ? ? ?NEEDED INFORMATION: ?PROCEDURE TO BE DONE-IF ANY TEETH ARE TO BE EXTRACTED WE WILL NEED TO KNOW HOW MANY ?TEETH ARE BEING EXTRACTED; AS WELL AS IF THEY ARE SIMPLE EXTRACTIONS OR SURGICAL EXTRACTIONS. THIS CAN MAKE A DIFFERENCE HOW LONG THE BLOOD THINNER MAY BE HELD ? ?PLEASE INFORM OUR OFFICE OF THE PROCEDURE TO BE DONE AT THIS TIME. ?

## 2022-02-08 NOTE — Telephone Encounter (Signed)
? ?  Patient Name: Tommy Dyer  ?DOB: [birth year] ?MRN: [id number] ? ?Primary Cardiologist: Peter Martinique, MD ? ?Chart reviewed as part of pre-operative protocol coverage. Preop coverage team, please clarify details surrounding procedure including number of extractions/fillings/crowns/bridges.   ? ?Thank you.  ? ?Lenna Sciara, NP ?02/08/2022, 12:15 PM ? ? ?

## 2022-02-10 NOTE — Telephone Encounter (Signed)
Covering preop today.  As below, callback has requested a specific procedural request clearance rather than blanket clearance since whether or not we hold blood thinner depends on procedure being performed.  Please route back to preop APP pool when specific request is obtained; otherwise will remove from preop box until more info is known.

## 2022-02-14 NOTE — Telephone Encounter (Signed)
   Pre-operative Risk Assessment    Patient Name: Tommy Dyer  DOB: 11-11-1935 MRN: [id number]     Request for Surgical Clearance    Procedure:   DEEP DENTAL CLEANING WITH FULL MOUTH DEBRIDEMENT   Date of Surgery:  Clearance TBD                                Surgeon:  DDS NOT LISTED Surgeon's Group or Practice Name: [facility] Phone number:  [phone] Fax number:  [phone]   Type of Clearance Requested:   - Medical  - Pharmacy:  Hold Apixaban (Eliquis)     Type of Anesthesia:  Not Indicated   Additional requests/questions:    Jiles Prows   02/14/2022, 4:23 PM

## 2022-02-14 NOTE — Telephone Encounter (Signed)
Left very detailed message for the dental office today to fax over a new clearance request with only the procedure to be done at this time. Please see previous notes; cardiologist will not provide a blanket type clearance with pt on a blood thinner. Fax over new clearance request to pre op team fax # [phone]

## 2022-02-14 NOTE — Telephone Encounter (Signed)
   Primary Cardiologist: Peter Martinique, MD  Chart reviewed as part of pre-operative protocol coverage. Simple dental extractions are considered low risk procedures per guidelines and generally do not require any specific cardiac clearance. It is also generally accepted that for simple extractions and dental cleanings, there is no need to interrupt blood thinner therapy.   SBE prophylaxis is not required for the patient.  I will route this recommendation to the requesting party via Epic fax function and remove from pre-op pool.  Please call with questions.  Emmaline Life, NP-C    02/14/2022, 4:35 PM [place] [address], [address] Office [phone] Fax [phone]

## 2022-02-16 DIAGNOSIS — K0402 Irreversible pulpitis: Secondary | ICD-10-CM | POA: Diagnosis not present

## 2022-03-21 DIAGNOSIS — C61 Malignant neoplasm of prostate: Secondary | ICD-10-CM | POA: Diagnosis not present

## 2022-03-28 ENCOUNTER — Other Ambulatory Visit: Payer: Self-pay | Admitting: Cardiology

## 2022-03-28 DIAGNOSIS — C61 Malignant neoplasm of prostate: Secondary | ICD-10-CM | POA: Diagnosis not present

## 2022-03-28 DIAGNOSIS — N200 Calculus of kidney: Secondary | ICD-10-CM | POA: Diagnosis not present

## 2022-03-28 DIAGNOSIS — R310 Gross hematuria: Secondary | ICD-10-CM | POA: Diagnosis not present

## 2022-04-03 IMAGING — DX DG CHEST 1V PORT
1 series · 1 of 1 positions shown · non-contrast
Comparison: 06/27/2011

CLINICAL DATA: Chest pain

EXAM:
PORTABLE CHEST 1 VIEW

[chest]
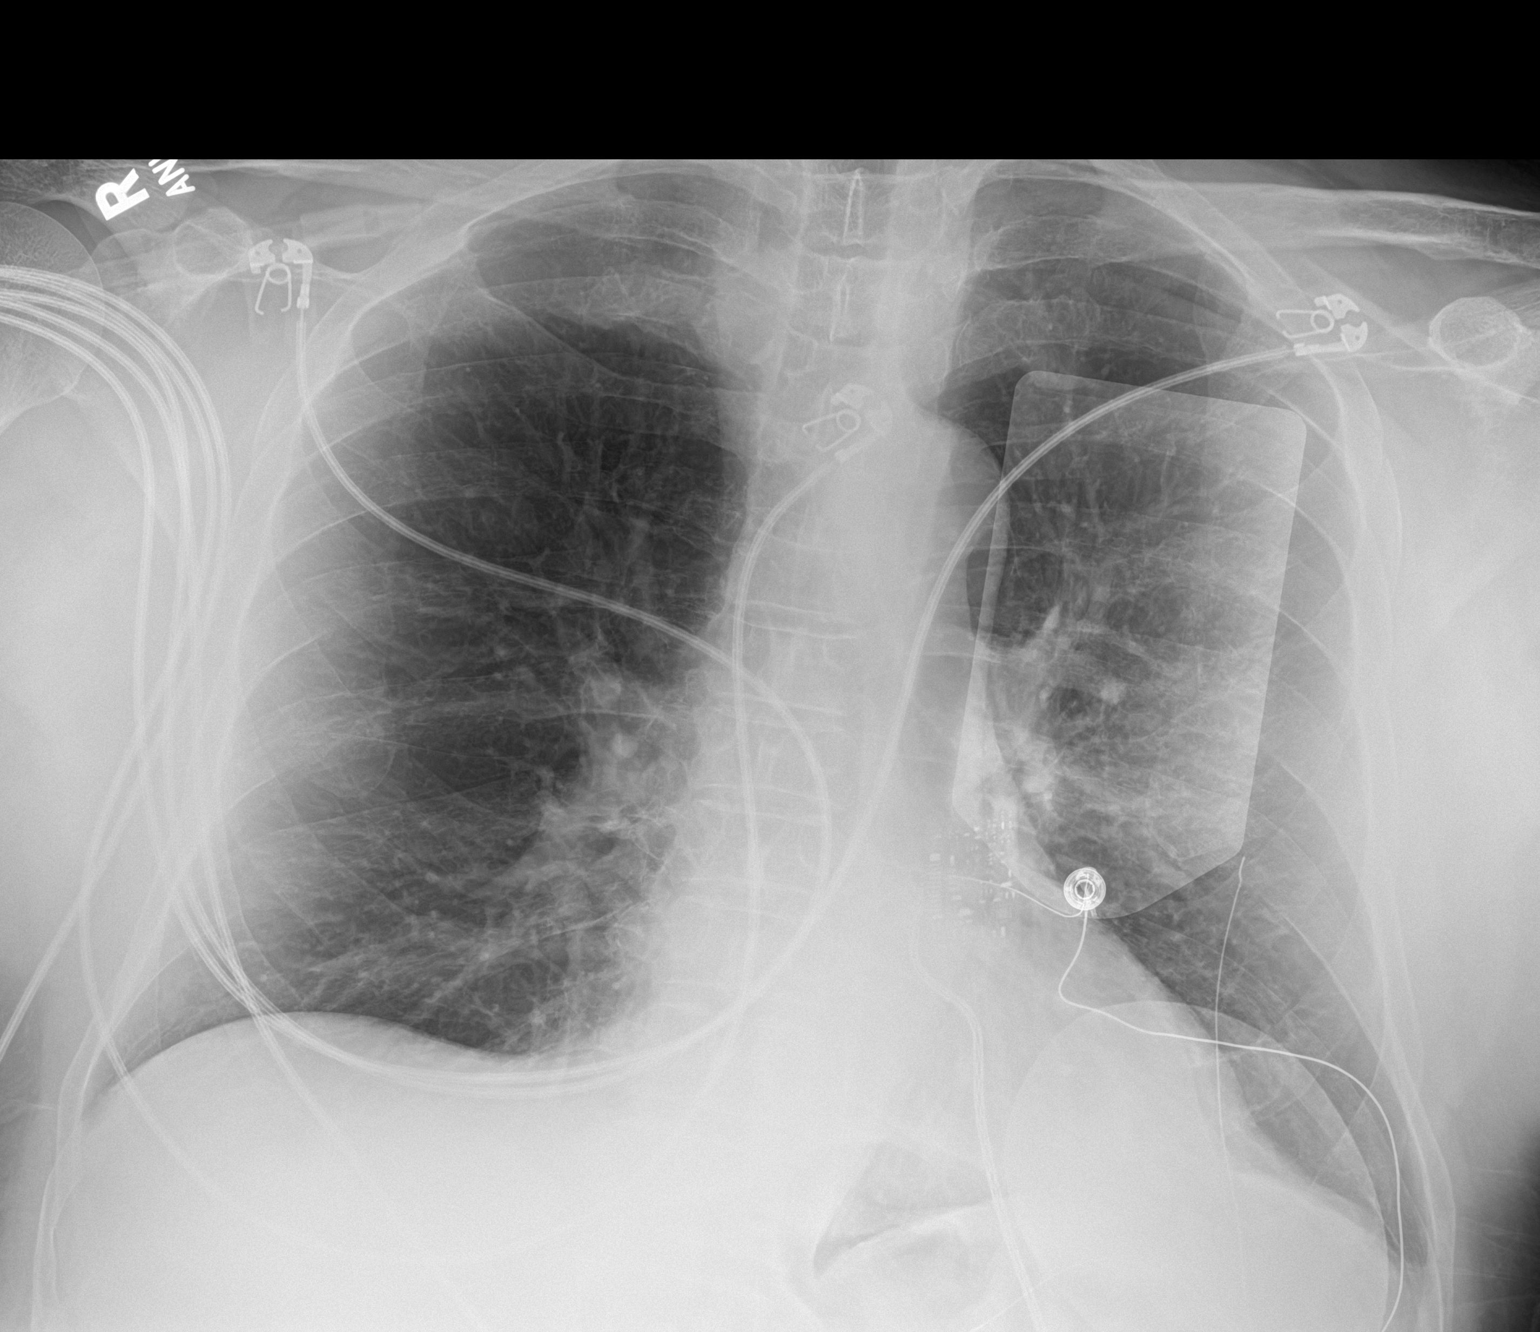

[1 of 1 positions shown; findings below may reference images not displayed]

FINDINGS: Cardiac shadow is stable. The lungs are well aerated bilaterally. No
focal infiltrate or sizable effusion is seen. No bony abnormality is
noted.
IMPRESSION: No acute abnormality noted.

## 2022-04-05 ENCOUNTER — Telehealth: Payer: Self-pay | Admitting: Cardiology

## 2022-04-05 NOTE — Telephone Encounter (Signed)
  Pt c/o of Chest Pain: STAT if CP now or developed within 24 hours  1. Are you having CP right now? No   2. Are you experiencing any other symptoms (ex. SOB, nausea, vomiting, sweating)? None   3. How long have you been experiencing CP? A day and half   4. Is your CP continuous or coming and going? Comes and goes every 5 mins  5. Have you taken Nitroglycerin? No  Pt said, he started feeling pain on her upper part of his chest. It comes and goes every 5 mins and when he moved on a certain position he will feel it. He has not taken nitroglycerin yet but he has it about doesn't know how to take it  ?

## 2022-04-05 NOTE — Telephone Encounter (Signed)
Received call from patient complaining of intermittent chest pain.   Reports pain started yesterday, pain in upper chest, intermittent, gets worse with certain movements, reports pain resolves with rest.   He reports eating "off" lately and unsure if this is related or could be heartburn.  He took mylanta earlier without resolution, took rolaids a few minutes ago.  Has not taken NTG.  No N/V/D, no SOB.  Pain does not increase with palpation, no radiation.   BP this AM 144/80 and 142/82 HR 68.  Reports pain is different than when he had his MI.  No pain at current.  Discussed ntg use and ER precautions.   Appt scheduled tomorrow at 9:15 AM with Almyra Deforest PA to assess further.   Patient aware and verbalized understanding.

## 2022-04-06 ENCOUNTER — Ambulatory Visit: Payer: Medicare HMO | Admitting: Physician Assistant

## 2022-04-06 ENCOUNTER — Encounter: Payer: Self-pay | Admitting: Physician Assistant

## 2022-04-06 VITALS — BP 142/68 | HR 86 | Ht 72.0 in | Wt 204.0 lb

## 2022-04-06 DIAGNOSIS — N1832 Chronic kidney disease, stage 3b: Secondary | ICD-10-CM | POA: Diagnosis not present

## 2022-04-06 DIAGNOSIS — I25119 Atherosclerotic heart disease of native coronary artery with unspecified angina pectoris: Secondary | ICD-10-CM | POA: Diagnosis not present

## 2022-04-06 DIAGNOSIS — R0789 Other chest pain: Secondary | ICD-10-CM

## 2022-04-06 DIAGNOSIS — E785 Hyperlipidemia, unspecified: Secondary | ICD-10-CM

## 2022-04-06 DIAGNOSIS — D509 Iron deficiency anemia, unspecified: Secondary | ICD-10-CM | POA: Diagnosis not present

## 2022-04-06 DIAGNOSIS — I2510 Atherosclerotic heart disease of native coronary artery without angina pectoris: Secondary | ICD-10-CM | POA: Diagnosis not present

## 2022-04-06 DIAGNOSIS — N184 Chronic kidney disease, stage 4 (severe): Secondary | ICD-10-CM

## 2022-04-06 DIAGNOSIS — I480 Paroxysmal atrial fibrillation: Secondary | ICD-10-CM

## 2022-04-06 DIAGNOSIS — N200 Calculus of kidney: Secondary | ICD-10-CM | POA: Diagnosis not present

## 2022-04-06 DIAGNOSIS — I10 Essential (primary) hypertension: Secondary | ICD-10-CM | POA: Diagnosis not present

## 2022-04-06 DIAGNOSIS — I129 Hypertensive chronic kidney disease with stage 1 through stage 4 chronic kidney disease, or unspecified chronic kidney disease: Secondary | ICD-10-CM | POA: Diagnosis not present

## 2022-04-06 DIAGNOSIS — C61 Malignant neoplasm of prostate: Secondary | ICD-10-CM | POA: Diagnosis not present

## 2022-04-06 MED ORDER — NITROGLYCERIN 0.4 MG SL SUBL: 0.4000 mg | SUBLINGUAL_TABLET | SUBLINGUAL | 2 refills | Status: DC | PRN

## 2022-04-06 NOTE — Patient Instructions (Addendum)
Medication Instructions:  Your physician recommends that you continue on your current medications as directed. Please refer to the Current Medication list given to you today.  *If you need a refill on your cardiac medications before your next appointment, please call your pharmacy*   Lab Work: NONE If you have labs (blood work) drawn today and your tests are completely normal, you will receive your results only by: [place] (if you have MyChart) OR A paper copy in the mail If you have any lab test that is abnormal or we need to change your treatment, we will call you to review the results.   Testing/Procedures: NONE   Follow-Up: At [facility], you and your health needs are our priority.  As part of our continuing mission to provide you with exceptional heart care, we have created designated Provider Care Teams.  These Care Teams include your primary Cardiologist (physician) and Advanced Practice Providers (APPs -  Physician Assistants and Nurse Practitioners) who all work together to provide you with the care you need, when you need it.  We recommend signing up for the patient portal called "MyChart".  Sign up information is provided on this After Visit Summary.  MyChart is used to connect with patients for Virtual Visits (Telemedicine).  Patients are able to view lab/test results, encounter notes, upcoming appointments, etc.  Non-urgent messages can be sent to your provider as well.   To learn more about what you can do with MyChart, go to [email or web address].    Your next appointment:   2-3 week(s)  The format for your next appointment:   In Person  Provider:   Almyra Deforest, PA-C

## 2022-04-06 NOTE — Progress Notes (Signed)
Cardiology Office Note:    Date:  04/08/2022   ID:  Tommy Dyer, DOB [birth year], MRN [id number]  PCP:  [name], [place] [facility] Cardiologist:  Peter Martinique, MD     Referring MD: [name], MD   Chief Complaint  Patient presents with   Follow-up    Seen for Dr. Martinique    History of Present Illness:    Tommy Dyer is a 86 y.o. male with a hx of CAD, hypertension, hyperlipidemia, CKD stage IV, prostate cancer and history of A-fib.  Patient had lateral STEMI in June 2021 and found to have 100% occluded second marginal branch treated with DES, he has 80% mid LAD and 40% proximal LAD residual. Recommendation is if he has refractory angina, can consider PCI of LAD at a later time.  Echocardiogram obtained on 03/14/2020 showed EF 55 to 60%, grade 1 DD.  He was discharged on Toprol-XL, amlodipine, Brilinta and aspirin.  He came back to the ED on 03/26/2020 with dizziness.  EKG demonstrated atrial fibrillation with RVR with heart rate of 131.  Plavix was started to replace Brilinta and Eliquis was started at 2.5 mg twice a day.  Hospital course complicated by AKI with serum creatinine of 2.86, baseline creatinine is about 2.5.  He was seen in the ED in December 2022 with COVID and was treated with molnupiravir.  Patient was last seen by Dr. Martinique on 01/13/2022 at which time he is stable from the cardiac perspective.  He is being followed by Dr. Santiago Bumpers of [facility].  Patient presents today for follow-up.  For the past 3 days, he has been having intermittent sharp stabbing pain.  Symptoms only last 1 to 2 seconds each and it goes away then recurs every 5-minute.  Symptoms finally went away this morning and has not came back.  His chest pain occurred at rest and not associated with physical activity.  EKG shows no acute changes.  I discussed his case with DOD Dr. [name], given the atypical nature, we recommended continued monitoring at this time.  If  the chest pain worsens or become more associated with physical exertion, he will need to let us know.  Otherwise, we plan to bring the patient back in 2 to 3 weeks for reassessment.  I will refill his sublingual nitroglycerin.  He has a history of stage IV chronic kidney disease, his baseline creatinine is about 2.4-2.5, unless there is clear anginal symptom, we would like to avoid another cardiac catheterization given the risk of pushing him into dialysis.  Past Medical History:  Diagnosis Date   Arthritis    CAD (coronary artery disease)    a. 02/2020: STEMI with DES to 100% 2nd Mrg stenosis. Residual 80% mid-LAD stenosis with consideration of staged PCI recommended.     Gastric ulcer    GERD (gastroesophageal reflux disease)    History of kidney stones    Hypercholesterolemia    Hypertension    Kidney stones    Osteoporosis    Prostate cancer ([place])    STEMI (ST elevation myocardial infarction) [facility])     Past Surgical History:  Procedure Laterality Date   colonscopy     CORONARY/GRAFT ACUTE MI REVASCULARIZATION N/A 03/13/2020   Procedure: CORONARY/GRAFT ACUTE MI REVASCULARIZATION;  Surgeon: Martinique, Peter M, MD;  Location: [facility] [facility];  Service: Cardiovascular;  Laterality: N/A;   EXTRACORPOREAL SHOCK WAVE LITHOTRIPSY Right 06/21/2018   Procedure: RIGHT EXTRACORPOREAL SHOCK WAVE LITHOTRIPSY (ESWL);  Surgeon: Ardis Hughs, MD;  Location: [facility];  Service: Urology;  Laterality: Right;   LEFT HEART CATH AND CORONARY ANGIOGRAPHY N/A 03/13/2020   Procedure: LEFT HEART CATH AND CORONARY ANGIOGRAPHY;  Surgeon: Martinique, Peter M, MD;  Location: [place] [facility];  Service: Cardiovascular;  Laterality: N/A;   PROSTATE BIOPSY     PROSTATE BIOPSY      Current Medications: Current Meds  Medication Sig   acetaminophen (TYLENOL) 500 MG tablet Take 1,000 mg by mouth every 6 (six) hours as needed for headache or moderate pain.   apixaban (ELIQUIS) 2.5 MG TABS tablet Take 1 tablet (2.5  mg total) by mouth 2 (two) times daily.   atorvastatin (LIPITOR) 80 MG tablet Take 1 tablet (80 mg total) by mouth daily.   Carboxymethylcellulose Sodium (REFRESH TEARS OP) Place 1 drop into both eyes daily as needed (dry eyes).    Famotidine (PEPCID AC PO) Take 1 tablet by mouth daily as needed (heartburn).   fluticasone (FLONASE) 50 MCG/ACT nasal spray Place 1 spray into both nostrils daily as needed for allergies or rhinitis.    irbesartan (AVAPRO) 150 MG tablet Take 150 mg by mouth daily.   Lidocaine-Glycerin (PREPARATION H EX) Place 1 application rectally daily as needed (hemorrhoids/pain/itching).    meclizine (ANTIVERT) 25 MG tablet Take 25 mg by mouth daily as needed for dizziness.   metoprolol succinate (TOPROL-XL) 50 MG 24 hr tablet Take 1 tablet (50 mg total) by mouth daily. Take with or immediately following a meal.   tamsulosin (FLOMAX) 0.4 MG CAPS capsule Take 0.4 mg by mouth every other day.   [DISCONTINUED] nitroGLYCERIN (NITROSTAT) 0.4 MG SL tablet Place 1 tablet (0.4 mg total) under the tongue every 5 (five) minutes x 3 doses as needed for chest pain. (Patient taking differently: Place 0.4 mg under the tongue every 5 (five) minutes as needed for chest pain.)     Allergies:   Patient has no known allergies.   Social History   Socioeconomic History   Marital status: Married    Spouse name: Not on file   Number of children: Not on file   Years of education: Not on file   Highest education level: Not on file  Occupational History   Not on file  Tobacco Use   Smoking status: Former    Packs/day: 1.00    Years: 20.00    Total pack years: 20.00    Types: Cigarettes    Quit date: 09/27/2003    Years since quitting: 18.5   Smokeless tobacco: Never  Vaping Use   Vaping Use: Never used  Substance and Sexual Activity   Alcohol use: No   Drug use: No   Sexual activity: Not on file  Other Topics Concern   Not on file  Social History Narrative   Not on file   Social  Determinants of Health   Financial Resource Strain: Not on file  Food Insecurity: Not on file  Transportation Needs: Not on file  Physical Activity: Not on file  Stress: Not on file  Social Connections: Not on file     Family History: The patient's family history includes Breast cancer in his maternal grandmother; Cancer in his mother; Diabetes in his father and mother; High blood pressure in his father and mother. There is no history of Colon cancer or Stomach cancer.  ROS:   Please see the history of present illness.     All other systems reviewed and are negative.  EKGs/Labs/Other  Studies Reviewed:    The following studies were reviewed today:  Cath 03/13/2020 Prox LAD lesion is 40% stenosed. Mid LAD lesion is 80% stenosed. 2nd Mrg lesion is 100% stenosed. Post intervention, there is a 0% residual stenosis. A drug-eluting stent was successfully placed using a STENT RESOLUTE ONYX [id number]. LV end diastolic pressure is mildly elevated.   1. 2 vessel obstructive CAD    -80% mid LAD    -100% second OM 2. Elevated LVEDP 23 mm Hg 3. Successful PCI of the second OM with DES x 1   Plan: DAPT for one year. Hydrate. Monitor renal function closely. Depending on recovery of renal function consider staged PCI of the mid LAD. Will assess LV function by Echo.   Echo 03/14/2020 1. Left ventricular ejection fraction, by estimation, is 55 to 60%. The  left ventricle has normal function. The left ventricle has no regional  wall motion abnormalities. Left ventricular diastolic parameters are  consistent with Grade I diastolic  dysfunction (impaired relaxation). Elevated left atrial pressure.   2. Right ventricular systolic function is normal. The right ventricular  size is normal.   3. The mitral valve is normal in structure. No evidence of mitral valve  regurgitation. No evidence of mitral stenosis.   4. The aortic valve is normal in structure. Aortic valve regurgitation is  not  visualized. Mild to moderate aortic valve sclerosis/calcification is  present, without any evidence of aortic stenosis.   5. The inferior vena cava is normal in size with greater than 50%  respiratory variability, suggesting right atrial pressure of 3 mmHg.    EKG:  EKG is ordered today.  The ekg ordered today demonstrates normal sinus rhythm, no significant ST-T wave changes.  Recent Labs: 09/09/2021: BUN 33; Creatinine, Ser 2.39; Hemoglobin 10.6; Platelets 176; Potassium 4.5; Sodium 134  Recent Lipid Panel    Component Value Date/Time   CHOL 124 05/12/2020 0843   TRIG 91 05/12/2020 0843   HDL 27 (L) 05/12/2020 0843   CHOLHDL 4.6 05/12/2020 0843   CHOLHDL 7.3 03/14/2020 0414   VLDL 24 03/14/2020 0414   LDLCALC 79 05/12/2020 0843     Risk Assessment/Calculations:           Physical Exam:    VS:  BP (!) 142/68   Pulse 86   Ht 6' (1.829 m)   Wt 204 lb (92.5 kg)   SpO2 98%   BMI 27.67 kg/m     Wt Readings from Last 3 Encounters:  04/06/22 204 lb (92.5 kg)  01/13/22 200 lb 9.6 oz (91 kg)  09/09/21 198 lb 6.6 oz (90 kg)     GEN:  Well nourished, well developed in no acute distress HEENT: Normal NECK: No JVD; No carotid bruits LYMPHATICS: No lymphadenopathy CARDIAC: RRR, no murmurs, rubs, gallops RESPIRATORY:  Clear to auscultation without rales, wheezing or rhonchi  ABDOMEN: Soft, non-tender, non-distended MUSCULOSKELETAL:  No edema; No deformity  SKIN: Warm and dry NEUROLOGIC:  Alert and oriented x 3 PSYCHIATRIC:  Normal affect   ASSESSMENT:    1. Atypical chest pain   2. Coronary artery disease involving native coronary artery of native heart with angina pectoris ([place])   3. Essential hypertension   4. Hyperlipidemia LDL goal <70   5. Chronic kidney disease (CKD), stage IV (severe) ([place])   6. PAF (paroxysmal atrial fibrillation) ([facility])    PLAN:    In order of problems listed above:  Atypical chest pain: For the past 3 days, patient has  been having some  atypical chest pain, the symptom only last 1 to 2 seconds before going away however may recur every 5 minutes.  Symptom is different from the previous angina.  Previous cardiac catheterization in 2021 revealed 100% OM2 lesion that was treated with DES, patient had a 80% mid LAD residual.  The recommendation is to only consider PCI if he has refractory angina.  He has stage IV kidney disease, and not a great candidate for cardiac catheterization due to high risk for contrast nephropathy.  Given atypical nature of the chest pain, will continue observation.  I will bring the patient back in 2 to 3 weeks for reassessment  CAD: Denies any chest pain.  On Lipitor.  Not on aspirin given the need for Eliquis  Hypertension: Blood pressure stable  Hyperlipidemia: On Lipitor  Stage IV CKD: Stable on last blood work  Atrial fibrillation: On Eliquis.  Maintaining sinus rhythm on today's EKG.  Continue metoprolol succinate 50 mg daily.           Medication Adjustments/Labs and Tests Ordered: Current medicines are reviewed at length with the patient today.  Concerns regarding medicines are outlined above.  Orders Placed This Encounter  Procedures   EKG 12-Lead   Meds ordered this encounter  Medications   nitroGLYCERIN (NITROSTAT) 0.4 MG SL tablet    Sig: Place 1 tablet (0.4 mg total) under the tongue every 5 (five) minutes x 3 doses as needed for chest pain.    Dispense:  25 tablet    Refill:  2    Patient Instructions  Medication Instructions:  Your physician recommends that you continue on your current medications as directed. Please refer to the Current Medication list given to you today.  *If you need a refill on your cardiac medications before your next appointment, please call your pharmacy*   Lab Work: NONE If you have labs (blood work) drawn today and your tests are completely normal, you will receive your results only by: [place] (if you have MyChart) OR A paper copy in the  mail If you have any lab test that is abnormal or we need to change your treatment, we will call you to review the results.   Testing/Procedures: NONE   Follow-Up: At [facility], you and your health needs are our priority.  As part of our continuing mission to provide you with exceptional heart care, we have created designated Provider Care Teams.  These Care Teams include your primary Cardiologist (physician) and Advanced Practice Providers (APPs -  Physician Assistants and Nurse Practitioners) who all work together to provide you with the care you need, when you need it.  We recommend signing up for the patient portal called "MyChart".  Sign up information is provided on this After Visit Summary.  MyChart is used to connect with patients for Virtual Visits (Telemedicine).  Patients are able to view lab/test results, encounter notes, upcoming appointments, etc.  Non-urgent messages can be sent to your provider as well.   To learn more about what you can do with MyChart, go to [email or web address].    Your next appointment:   2-3 week(s)  The format for your next appointment:   In Person  Provider:   Almyra Deforest, PA-C         Signed, Almyra Deforest, [place]  04/08/2022 11:43 PM    [place]

## 2022-04-08 ENCOUNTER — Encounter: Payer: Self-pay | Admitting: Physician Assistant

## 2022-04-20 ENCOUNTER — Other Ambulatory Visit: Payer: Self-pay | Admitting: Cardiology

## 2022-04-20 NOTE — Telephone Encounter (Signed)
Prescription refill request for Eliquis received.  Indication: afib  Last office visit: Eulas Post 04/06/2022 Scr: 2.39, 09/09/2021 Age: 86 yo  Weight: 92.5 kg   Pt is on the correct dose of Eliquis. Refill sent.

## 2022-04-25 ENCOUNTER — Encounter: Payer: Self-pay | Admitting: Physician Assistant

## 2022-04-25 ENCOUNTER — Telehealth: Payer: Self-pay

## 2022-04-25 ENCOUNTER — Ambulatory Visit: Payer: Medicare HMO | Admitting: Physician Assistant

## 2022-04-25 VITALS — BP 149/85 | Ht 72.0 in | Wt 198.2 lb

## 2022-04-25 DIAGNOSIS — E785 Hyperlipidemia, unspecified: Secondary | ICD-10-CM | POA: Diagnosis not present

## 2022-04-25 DIAGNOSIS — I2510 Atherosclerotic heart disease of native coronary artery without angina pectoris: Secondary | ICD-10-CM | POA: Diagnosis not present

## 2022-04-25 DIAGNOSIS — I10 Essential (primary) hypertension: Secondary | ICD-10-CM | POA: Diagnosis not present

## 2022-04-25 DIAGNOSIS — I480 Paroxysmal atrial fibrillation: Secondary | ICD-10-CM

## 2022-04-25 DIAGNOSIS — N184 Chronic kidney disease, stage 4 (severe): Secondary | ICD-10-CM | POA: Diagnosis not present

## 2022-04-25 NOTE — Patient Instructions (Addendum)
Medication Instructions:  Your physician recommends that you continue on your current medications as directed. Please refer to the Current Medication list given to you today. Medication Samples have been provided to the patient.  Drug name: Eliquis (Apixaban)       Strength: 2.5 mg         Qty: 4 boxes   LOT: [id number]  Exp.Date: FEB 2024  Dosing instructions: Take 1 tablet by mouth 2 times a day   The patient has been instructed regarding the correct time, dose, and frequency of taking this medication, including desired effects and most common side effects.   Jacqulynn Cadet 3:10 PM 04/25/2022   *If you need a refill on your cardiac medications before your next appointment, please call your pharmacy*  Lab Work: NONE ordered at this time of appointment   If you have labs (blood work) drawn today and your tests are completely normal, you will receive your results only by: [place] (if you have MyChart) OR A paper copy in the mail If you have any lab test that is abnormal or we need to change your treatment, we will call you to review the results.  Testing/Procedures: NONE ordered at this time of appointment   Follow-Up: At [facility], you and your health needs are our priority.  As part of our continuing mission to provide you with exceptional heart care, we have created designated Provider Care Teams.  These Care Teams include your primary Cardiologist (physician) and Advanced Practice Providers (APPs -  Physician Assistants and Nurse Practitioners) who all work together to provide you with the care you need, when you need it.  We recommend signing up for the patient portal called "MyChart".  Sign up information is provided on this After Visit Summary.  MyChart is used to connect with patients for Virtual Visits (Telemedicine).  Patients are able to view lab/test results, encounter notes, upcoming appointments, etc.  Non-urgent messages can be sent to your provider as well.    To learn more about what you can do with MyChart, go to [email or web address].    Your next appointment:   6 month(s)  The format for your next appointment:   In Person  Provider:   Peter Martinique, MD    Other Instructions CONTINUE to monitor blood pressure at home. If Systolic (Top Number) is consistently greater than 140, INCREASE Metoprolol Succinate to 75 mg (1.5 tablets). Give our office a call about your blood pressure readings and if you have to increase Metoprolol Succinate.   Important Information About Sugar

## 2022-04-25 NOTE — Telephone Encounter (Signed)
Faxed application to the Eliquis found for patient. Patient will bring in financial records to be faxed later in the week.

## 2022-04-25 NOTE — Progress Notes (Unsigned)
Cardiology Office Note:    Date:  04/27/2022   ID:  Tommy Dyer, DOB August 20, 1936, MRN [id number]  PCP:  [name], [place] Providers Cardiologist:  Peter Martinique, MD     Referring MD: [name], MD   Chief Complaint  Patient presents with   Follow-up    Seen for Dr. Martinique    History of Present Illness:    Tommy Dyer is a 86 y.o. male with a hx of CAD, HTN, HLD, CKD stage IV, prostate cancer and history of A-fib.  Patient had lateral STEMI in June 2021 and found to have 100% occluded second marginal branch treated with DES, he has 80% mid LAD and 40% proximal LAD residual. Recommendation is if he has refractory angina, can consider PCI of LAD at a later time.  Echocardiogram obtained on 03/14/2020 showed EF 55 to 60%, grade 1 DD.  He was discharged on Toprol-XL, amlodipine, Brilinta and aspirin.  He came back to the ED on 03/26/2020 with dizziness.  EKG demonstrated atrial fibrillation with RVR with heart rate of 131.  Plavix was started to replace Brilinta and Eliquis was started at 2.5 mg twice a day.  Hospital course complicated by AKI with serum creatinine of 2.86, baseline creatinine is about 2.5.  He was seen in the ED in December 2022 with COVID and was treated with molnupiravir.  Patient was last seen by Dr. Martinique on 01/13/2022 at which time he is stable from the cardiac perspective.  He is being followed by Dr. Santiago Bumpers of [facility].  I last saw the patient on 04/06/2022 at which time he was having intermittent sharp stabbing pain for 3 days.  His symptom lasted 1 to 2 seconds each and it goes away then recurs every 5 minutes.  I discussed the case with DOD Dr. Alvester Chou at the time, given the atypical nature and the high risk for contrast nephropathy, we recommended continued monitoring.  Patient presents today for follow-up.  His chest pain has not recurred since the last visit.  His blood pressure at home has been in the 130s, however  intermittently goes up to 140s.  I recommend he continue to monitor his blood pressure, if systolic blood pressure is persistently in the 140s, I would recommend he increase the metoprolol succinate to 75 mg daily.  Otherwise, he can follow-up with Dr. Martinique in 6 months   Past Medical History:  Diagnosis Date   Arthritis    CAD (coronary artery disease)    a. 02/2020: STEMI with DES to 100% 2nd Mrg stenosis. Residual 80% mid-LAD stenosis with consideration of staged PCI recommended.     Gastric ulcer    GERD (gastroesophageal reflux disease)    History of kidney stones    Hypercholesterolemia    Hypertension    Kidney stones    Osteoporosis    Prostate cancer ([place])    STEMI (ST elevation myocardial infarction) [facility])     Past Surgical History:  Procedure Laterality Date   colonscopy     CORONARY/GRAFT ACUTE MI REVASCULARIZATION N/A 03/13/2020   Procedure: CORONARY/GRAFT ACUTE MI REVASCULARIZATION;  Surgeon: Martinique, Peter M, MD;  Location: [place] [facility];  Service: Cardiovascular;  Laterality: N/A;   EXTRACORPOREAL SHOCK WAVE LITHOTRIPSY Right 06/21/2018   Procedure: RIGHT EXTRACORPOREAL SHOCK WAVE LITHOTRIPSY (ESWL);  Surgeon: Ardis Hughs, MD;  Location: [facility];  Service: Urology;  Laterality: Right;   LEFT HEART CATH AND CORONARY ANGIOGRAPHY N/A 03/13/2020  Procedure: LEFT HEART CATH AND CORONARY ANGIOGRAPHY;  Surgeon: Martinique, Peter M, MD;  Location: [place] [facility];  Service: Cardiovascular;  Laterality: N/A;   PROSTATE BIOPSY     PROSTATE BIOPSY      Current Medications: Current Meds  Medication Sig   acetaminophen (TYLENOL) 500 MG tablet Take 1,000 mg by mouth every 6 (six) hours as needed for headache or moderate pain.   atorvastatin (LIPITOR) 80 MG tablet Take 1 tablet (80 mg total) by mouth daily.   Carboxymethylcellulose Sodium (REFRESH TEARS OP) Place 1 drop into both eyes daily as needed (dry eyes).    ELIQUIS 2.5 MG TABS tablet TAKE 1 TABLET TWICE DAILY    Famotidine (PEPCID AC PO) Take 1 tablet by mouth daily as needed (heartburn).   fluticasone (FLONASE) 50 MCG/ACT nasal spray Place 1 spray into both nostrils daily as needed for allergies or rhinitis.    irbesartan (AVAPRO) 150 MG tablet Take 150 mg by mouth daily.   Lidocaine-Glycerin (PREPARATION H EX) Place 1 application rectally daily as needed (hemorrhoids/pain/itching).    meclizine (ANTIVERT) 25 MG tablet Take 25 mg by mouth daily as needed for dizziness.   metoprolol succinate (TOPROL-XL) 50 MG 24 hr tablet Take 1 tablet (50 mg total) by mouth daily. Take with or immediately following a meal.   nitroGLYCERIN (NITROSTAT) 0.4 MG SL tablet Place 1 tablet (0.4 mg total) under the tongue every 5 (five) minutes x 3 doses as needed for chest pain.   tamsulosin (FLOMAX) 0.4 MG CAPS capsule Take 0.4 mg by mouth every other day.     Allergies:   Patient has no known allergies.   Social History   Socioeconomic History   Marital status: Married    Spouse name: Not on file   Number of children: Not on file   Years of education: Not on file   Highest education level: Not on file  Occupational History   Not on file  Tobacco Use   Smoking status: Former    Packs/day: 1.00    Years: 20.00    Total pack years: 20.00    Types: Cigarettes    Quit date: 09/27/2003    Years since quitting: 18.5   Smokeless tobacco: Never  Vaping Use   Vaping Use: Never used  Substance and Sexual Activity   Alcohol use: No   Drug use: No   Sexual activity: Not on file  Other Topics Concern   Not on file  Social History Narrative   Not on file   Social Determinants of Health   Financial Resource Strain: Not on file  Food Insecurity: Not on file  Transportation Needs: Not on file  Physical Activity: Not on file  Stress: Not on file  Social Connections: Not on file     Family History: The patient's family history includes Breast cancer in his maternal grandmother; Cancer in his mother; Diabetes in  his father and mother; High blood pressure in his father and mother. There is no history of Colon cancer or Stomach cancer.  ROS:   Please see the history of present illness.     All other systems reviewed and are negative.  EKGs/Labs/Other Studies Reviewed:    The following studies were reviewed today:  Echo 03/14/2020 1. Left ventricular ejection fraction, by estimation, is 55 to 60%. The  left ventricle has normal function. The left ventricle has no regional  wall motion abnormalities. Left ventricular diastolic parameters are  consistent with Grade I diastolic  dysfunction (impaired relaxation). Elevated left atrial pressure.   2. Right ventricular systolic function is normal. The right ventricular  size is normal.   3. The mitral valve is normal in structure. No evidence of mitral valve  regurgitation. No evidence of mitral stenosis.   4. The aortic valve is normal in structure. Aortic valve regurgitation is  not visualized. Mild to moderate aortic valve sclerosis/calcification is  present, without any evidence of aortic stenosis.   5. The inferior vena cava is normal in size with greater than 50%  respiratory variability, suggesting right atrial pressure of 3 mmHg.   EKG:  EKG is not ordered today.    Recent Labs: 09/09/2021: BUN 33; Creatinine, Ser 2.39; Hemoglobin 10.6; Platelets 176; Potassium 4.5; Sodium 134  Recent Lipid Panel    Component Value Date/Time   CHOL 124 05/12/2020 0843   TRIG 91 05/12/2020 0843   HDL 27 (L) 05/12/2020 0843   CHOLHDL 4.6 05/12/2020 0843   CHOLHDL 7.3 03/14/2020 0414   VLDL 24 03/14/2020 0414   LDLCALC 79 05/12/2020 0843     Risk Assessment/Calculations:    CHA2DS2-VASc Score = 4   This indicates a 4.8% annual risk of stroke. The patient's score is based upon: CHF History: 0 HTN History: 1 Diabetes History: 0 Stroke History: 0 Vascular Disease History: 1 Age Score: 2 Gender Score: 0          Physical Exam:    VS:  BP  (!) 149/85   Ht 6' (1.829 m)   Wt 198 lb 3.2 oz (89.9 kg)   SpO2 96%   BMI 26.88 kg/m        Wt Readings from Last 3 Encounters:  04/25/22 198 lb 3.2 oz (89.9 kg)  04/06/22 204 lb (92.5 kg)  01/13/22 200 lb 9.6 oz (91 kg)     GEN:  Well nourished, well developed in no acute distress HEENT: Normal NECK: No JVD; No carotid bruits LYMPHATICS: No lymphadenopathy CARDIAC: RRR, no murmurs, rubs, gallops RESPIRATORY:  Clear to auscultation without rales, wheezing or rhonchi  ABDOMEN: Soft, non-tender, non-distended MUSCULOSKELETAL:  No edema; No deformity  SKIN: Warm and dry NEUROLOGIC:  Alert and oriented x 3 PSYCHIATRIC:  Normal affect   ASSESSMENT:    1. Coronary artery disease involving native coronary artery of native heart without angina pectoris   2. Essential hypertension   3. Hyperlipidemia LDL goal <70   4. Chronic kidney disease (CKD), stage IV (severe) ([place])   5. PAF (paroxysmal atrial fibrillation) (HCC)    PLAN:    In order of problems listed above:  CAD: I previously evaluated the patient for atypical chest pain.  Given significant renal dysfunction, I recommended continue observation.  He says his chest pain has not recurred since the last visit.  At this time, we do not recommend any further cardiac work-up.  In this patient with stage IV kidney disease, I would not recommend any invasive study unless there is definitive evidence that he has worsening underlying disease  Hypertension: Blood pressure elevated, will increase metoprolol succinate to 75 mg daily  Hyperlipidemia: On Lipitor  CKD stage IV: Followed by Dr. Santiago Bumpers of [facility]  PAF: On Eliquis and metoprolol            Medication Adjustments/Labs and Tests Ordered: Current medicines are reviewed at length with the patient today.  Concerns regarding medicines are outlined above.  No orders of the defined types were placed in this encounter.  No  orders of the defined  types were placed in this encounter.   Patient Instructions  Medication Instructions:  Your physician recommends that you continue on your current medications as directed. Please refer to the Current Medication list given to you today. Medication Samples have been provided to the patient.  Drug name: Eliquis (Apixaban)       Strength: 2.5 mg         Qty: 4 boxes   LOT: [id number]  Exp.Date: FEB 2024  Dosing instructions: Take 1 tablet by mouth 2 times a day   The patient has been instructed regarding the correct time, dose, and frequency of taking this medication, including desired effects and most common side effects.   Jacqulynn Cadet 3:10 PM 04/25/2022   *If you need a refill on your cardiac medications before your next appointment, please call your pharmacy*  Lab Work: NONE ordered at this time of appointment   If you have labs (blood work) drawn today and your tests are completely normal, you will receive your results only by: [place] (if you have MyChart) OR A paper copy in the mail If you have any lab test that is abnormal or we need to change your treatment, we will call you to review the results.  Testing/Procedures: NONE ordered at this time of appointment   Follow-Up: At [facility], you and your health needs are our priority.  As part of our continuing mission to provide you with exceptional heart care, we have created designated Provider Care Teams.  These Care Teams include your primary Cardiologist (physician) and Advanced Practice Providers (APPs -  Physician Assistants and Nurse Practitioners) who all work together to provide you with the care you need, when you need it.  We recommend signing up for the patient portal called "MyChart".  Sign up information is provided on this After Visit Summary.  MyChart is used to connect with patients for Virtual Visits (Telemedicine).  Patients are able to view lab/test results, encounter notes, upcoming appointments,  etc.  Non-urgent messages can be sent to your provider as well.   To learn more about what you can do with MyChart, go to [email or web address].    Your next appointment:   6 month(s)  The format for your next appointment:   In Person  Provider:   Peter Martinique, MD    Other Instructions CONTINUE to monitor blood pressure at home. If Systolic (Top Number) is consistently greater than 140, INCREASE Metoprolol Succinate to 75 mg (1.5 tablets). Give our office a call about your blood pressure readings and if you have to increase Metoprolol Succinate.   Important Information About Sugar         Hilbert Corrigan, [place]  04/27/2022 10:51 PM    [place]

## 2022-04-27 ENCOUNTER — Encounter: Payer: Self-pay | Admitting: Physician Assistant

## 2022-05-10 DIAGNOSIS — I10 Essential (primary) hypertension: Secondary | ICD-10-CM | POA: Diagnosis not present

## 2022-05-10 DIAGNOSIS — Z8546 Personal history of malignant neoplasm of prostate: Secondary | ICD-10-CM | POA: Diagnosis not present

## 2022-05-10 DIAGNOSIS — N1832 Chronic kidney disease, stage 3b: Secondary | ICD-10-CM | POA: Diagnosis not present

## 2022-05-10 DIAGNOSIS — R7309 Other abnormal glucose: Secondary | ICD-10-CM | POA: Diagnosis not present

## 2022-05-10 DIAGNOSIS — Z0000 Encounter for general adult medical examination without abnormal findings: Secondary | ICD-10-CM | POA: Diagnosis not present

## 2022-05-10 DIAGNOSIS — I480 Paroxysmal atrial fibrillation: Secondary | ICD-10-CM | POA: Diagnosis not present

## 2022-05-10 DIAGNOSIS — Z1331 Encounter for screening for depression: Secondary | ICD-10-CM | POA: Diagnosis not present

## 2022-05-10 DIAGNOSIS — R7303 Prediabetes: Secondary | ICD-10-CM | POA: Diagnosis not present

## 2022-05-10 DIAGNOSIS — D638 Anemia in other chronic diseases classified elsewhere: Secondary | ICD-10-CM | POA: Diagnosis not present

## 2022-05-10 DIAGNOSIS — D509 Iron deficiency anemia, unspecified: Secondary | ICD-10-CM | POA: Diagnosis not present

## 2022-05-10 DIAGNOSIS — I2510 Atherosclerotic heart disease of native coronary artery without angina pectoris: Secondary | ICD-10-CM | POA: Diagnosis not present

## 2022-05-16 DIAGNOSIS — I10 Essential (primary) hypertension: Secondary | ICD-10-CM | POA: Diagnosis not present

## 2022-05-16 DIAGNOSIS — E7800 Pure hypercholesterolemia, unspecified: Secondary | ICD-10-CM | POA: Diagnosis not present

## 2022-05-16 DIAGNOSIS — I480 Paroxysmal atrial fibrillation: Secondary | ICD-10-CM | POA: Diagnosis not present

## 2022-05-16 DIAGNOSIS — I2510 Atherosclerotic heart disease of native coronary artery without angina pectoris: Secondary | ICD-10-CM | POA: Diagnosis not present

## 2022-05-16 DIAGNOSIS — K219 Gastro-esophageal reflux disease without esophagitis: Secondary | ICD-10-CM | POA: Diagnosis not present

## 2022-05-25 ENCOUNTER — Telehealth: Payer: Self-pay | Admitting: Cardiology

## 2022-05-25 NOTE — Telephone Encounter (Signed)
Pt c/o of Chest Pain: STAT if CP now or developed within 24 hours  1. Are you having CP right now? No   2. Are you experiencing any other symptoms (ex. SOB, nausea, vomiting, sweating)? BP elevated when CP occurs  3. How long have you been experiencing CP? 3 days   4. Is your CP continuous or coming and going? Coming and going   5. Have you taken Nitroglycerin? No     Wanting appt to be seen regarding this. Takes two tylenol when it occurrs reports it relieves it somewhat. States he has nitroglycerin, but doesn't know how to take it properly.  Patient is not having CP now, but has experienced the chest discomfort today. Please advise. ?

## 2022-05-25 NOTE — Telephone Encounter (Signed)
Spoke to patient . Patient states the  chest discomfort is fleeting. It dose not last long.   It can occur with movement and if he is sitting still. No shortness of breath , no radiation of pain , no diaphoresis.   Patient states he has not used any NTG tablets . He was not sure how to use it.    RN  verbal instructed patient on how to use medication. Patient received a new prescription on 04/24/22. Patient had an appointment 04/24/22.  Patient states these symptoms were not present before or during this appointment.  Patient mention he has some congestion as well- RN informed patient to contact  primary.  Patient aware of appointment schedule for 06/01/22  aware if use NTG X 3 .   No relief call 911 to go to ER.

## 2022-06-01 ENCOUNTER — Encounter: Payer: Self-pay | Admitting: Nurse Practitioner

## 2022-06-01 ENCOUNTER — Ambulatory Visit: Payer: Medicare HMO | Attending: Physician Assistant | Admitting: Nurse Practitioner

## 2022-06-01 VITALS — BP 141/62 | HR 72 | Ht 72.0 in | Wt 197.6 lb

## 2022-06-01 DIAGNOSIS — I213 ST elevation (STEMI) myocardial infarction of unspecified site: Secondary | ICD-10-CM

## 2022-06-01 DIAGNOSIS — I480 Paroxysmal atrial fibrillation: Secondary | ICD-10-CM | POA: Diagnosis not present

## 2022-06-01 DIAGNOSIS — Z7901 Long term (current) use of anticoagulants: Secondary | ICD-10-CM | POA: Diagnosis not present

## 2022-06-01 DIAGNOSIS — I2510 Atherosclerotic heart disease of native coronary artery without angina pectoris: Secondary | ICD-10-CM

## 2022-06-01 DIAGNOSIS — E785 Hyperlipidemia, unspecified: Secondary | ICD-10-CM

## 2022-06-01 DIAGNOSIS — I10 Essential (primary) hypertension: Secondary | ICD-10-CM | POA: Diagnosis not present

## 2022-06-01 DIAGNOSIS — R0689 Other abnormalities of breathing: Secondary | ICD-10-CM

## 2022-06-01 DIAGNOSIS — N184 Chronic kidney disease, stage 4 (severe): Secondary | ICD-10-CM | POA: Diagnosis not present

## 2022-06-01 DIAGNOSIS — R319 Hematuria, unspecified: Secondary | ICD-10-CM | POA: Diagnosis not present

## 2022-06-01 DIAGNOSIS — R0789 Other chest pain: Secondary | ICD-10-CM | POA: Diagnosis not present
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
## 2022-06-01 NOTE — Patient Instructions (Addendum)
Medication Instructions:  Your physician recommends that you continue on your current medications as directed. Please refer to the Current Medication list given to you today.  *If you need a refill on your cardiac medications before your next appointment, please call your pharmacy*  Lab Work: Your physician recommends that you return for lab work TODAY:  BMP  If you have labs (blood work) drawn today and your tests are completely normal, you will receive your results only by: [place] (if you have [place]) OR A paper copy in the mail If you have any lab test that is abnormal or we need to change your treatment, we will call you to review the results.  Testing/Procedures: NONE ordered at this time of appointment   Follow-Up: At [facility], you and your health needs are our priority.  As part of our continuing mission to provide you with exceptional heart care, we have created designated Provider Care Teams.  These Care Teams include your primary Cardiologist (physician) and Advanced Practice Providers (APPs -  Physician Assistants and Nurse Practitioners) who all work together to provide you with the care you need, when you need it.  We recommend signing up for the patient portal called "MyChart".  Sign up information is provided on this After Visit Summary.  MyChart is used to connect with patients for Virtual Visits (Telemedicine).  Patients are able to view lab/test results, encounter notes, upcoming appointments, etc.  Non-urgent messages can be sent to your provider as well.   To learn more about what you can do with MyChart, go to [email or web address].    Your next appointment:   3 month(s)  The format for your next appointment:   In Person  Provider:   Peter Martinique, MD     Other Instructions  Important Information About Sugar

## 2022-06-02 DIAGNOSIS — R0689 Other abnormalities of breathing: Secondary | ICD-10-CM | POA: Insufficient documentation

## 2022-06-02 DIAGNOSIS — R319 Hematuria, unspecified: Secondary | ICD-10-CM | POA: Insufficient documentation

## 2022-06-02 LAB — BASIC METABOLIC PANEL
BUN/Creatinine Ratio: 14 (ref 10–24)
BUN: 30 mg/dL — ABNORMAL HIGH (ref 8–27)
CO2: 21 mmol/L (ref 20–29)
Calcium: 9.9 mg/dL (ref 8.6–10.2)
Chloride: 104 mmol/L (ref 96–106)
Creatinine, Ser: 2.13 mg/dL — ABNORMAL HIGH (ref 0.76–1.27)
Glucose: 76 mg/dL (ref 70–99)
Potassium: 4.7 mmol/L (ref 3.5–5.2)
Sodium: 141 mmol/L (ref 134–144)
eGFR: 30 mL/min/{1.73_m2} — ABNORMAL LOW (ref >59)

## 2022-06-06 ENCOUNTER — Telehealth: Payer: Self-pay | Admitting: Cardiology

## 2022-06-06 NOTE — Telephone Encounter (Signed)
Samantha from [place] returning call. She says she did fax the OOP. Phone: [phone]

## 2022-06-06 NOTE — Telephone Encounter (Signed)
Returned call to [place] at [place] who reports that she was calling to make Tommy Dyer aware that she has faxed over the out pocket pharmacy costs that she has been waiting for. Advised her I would forward message over to Ellison Carwin to make her aware.

## 2022-06-08 ENCOUNTER — Telehealth: Payer: Self-pay | Admitting: Cardiology

## 2022-06-08 NOTE — Telephone Encounter (Signed)
States she was returning your call. Please advise

## 2022-06-08 NOTE — Telephone Encounter (Signed)
Called the foundation to inquire about application status after faxing patient's out of pocket expenses were faxed to foundation on on 06/06/22. Abigail Butts informed me that the patient Tommy Dyer's out of pocket expenses were not met and that he will need to show an additional out of pocket expense of $242.47. She also stated that what was faxed did not include his wife's out of pocket expenses so if we could get that information and fax back to the foundation that would be helpful and a decision will be made.

## 2022-06-09 NOTE — Telephone Encounter (Signed)
Called the foundation and informed the representative that I faxed over 20+ pages of additional out of pocket expenses and if they received the papers. Representative stated to call back tomorrow morning.

## 2022-06-09 NOTE — Telephone Encounter (Signed)
Faxed the out of pocket expenses of the patient's wife to the Patient assistance program. Gave patient Tommy Dyer a call informing him that I faxed the information that was dropped off to the office to my attention and that I will be out of the office and I will give him a call back if the foundation sends over a decision. Patient thanked me for calling and verbalized understanding.

## 2022-06-21 ENCOUNTER — Emergency Department (HOSPITAL_COMMUNITY)
Admission: EM | Admit: 2022-06-21 | Discharge: 2022-06-22 | Disposition: A | Discharge status: Home / Self Care | Payer: Medicare HMO | Attending: Emergency Medicine | Admitting: Emergency Medicine

## 2022-06-21 ENCOUNTER — Other Ambulatory Visit: Payer: Self-pay

## 2022-06-21 ENCOUNTER — Encounter (HOSPITAL_COMMUNITY): Payer: Self-pay | Admitting: Emergency Medicine

## 2022-06-21 ENCOUNTER — Emergency Department (HOSPITAL_COMMUNITY): Payer: Medicare HMO

## 2022-06-21 DIAGNOSIS — I2510 Atherosclerotic heart disease of native coronary artery without angina pectoris: Secondary | ICD-10-CM | POA: Diagnosis not present

## 2022-06-21 DIAGNOSIS — R0789 Other chest pain: Secondary | ICD-10-CM | POA: Insufficient documentation

## 2022-06-21 DIAGNOSIS — Z79899 Other long term (current) drug therapy: Secondary | ICD-10-CM | POA: Diagnosis not present

## 2022-06-21 DIAGNOSIS — R079 Chest pain, unspecified: Secondary | ICD-10-CM | POA: Diagnosis not present

## 2022-06-21 DIAGNOSIS — N184 Chronic kidney disease, stage 4 (severe): Secondary | ICD-10-CM | POA: Insufficient documentation

## 2022-06-21 DIAGNOSIS — I129 Hypertensive chronic kidney disease with stage 1 through stage 4 chronic kidney disease, or unspecified chronic kidney disease: Secondary | ICD-10-CM | POA: Insufficient documentation

## 2022-06-21 DIAGNOSIS — Z8546 Personal history of malignant neoplasm of prostate: Secondary | ICD-10-CM | POA: Diagnosis not present

## 2022-06-21 DIAGNOSIS — D649 Anemia, unspecified: Secondary | ICD-10-CM | POA: Diagnosis not present

## 2022-06-21 DIAGNOSIS — Z7901 Long term (current) use of anticoagulants: Secondary | ICD-10-CM | POA: Insufficient documentation

## 2022-06-21 LAB — CBC WITH DIFFERENTIAL/PLATELET
Abs Immature Granulocytes: 0.01 10*3/uL (ref 0.00–0.07)
Basophils Absolute: 0.1 10*3/uL (ref 0.0–0.1)
Basophils Relative: 1 %
Eosinophils Absolute: 0.2 10*3/uL (ref 0.0–0.5)
Eosinophils Relative: 4 %
HCT: 34.4 % — ABNORMAL LOW (ref 39.0–52.0)
Hemoglobin: 12.1 g/dL — ABNORMAL LOW (ref 13.0–17.0)
Immature Granulocytes: 0 %
Lymphocytes Relative: 28 %
Lymphs Abs: 1.7 10*3/uL (ref 0.7–4.0)
MCH: 27.7 pg (ref 26.0–34.0)
MCHC: 35.2 g/dL (ref 30.0–36.0)
MCV: 78.7 fL — ABNORMAL LOW (ref 80.0–100.0)
Monocytes Absolute: 0.5 10*3/uL (ref 0.1–1.0)
Monocytes Relative: 9 %
Neutro Abs: 3.4 10*3/uL (ref 1.7–7.7)
Neutrophils Relative %: 58 %
Platelets: 153 10*3/uL (ref 150–400)
RBC: 4.37 MIL/uL (ref 4.22–5.81)
RDW: 16 % — ABNORMAL HIGH (ref 11.5–15.5)
WBC: 5.8 10*3/uL (ref 4.0–10.5)
nRBC: 0 % (ref 0.0–0.2)

## 2022-06-21 LAB — COMPREHENSIVE METABOLIC PANEL
ALT: 44 U/L (ref 0–44)
AST: 40 U/L (ref 15–41)
Albumin: 3.2 g/dL — ABNORMAL LOW (ref 3.5–5.0)
Alkaline Phosphatase: 62 U/L (ref 38–126)
Anion gap: 6 (ref 5–15)
BUN: 31 mg/dL — ABNORMAL HIGH (ref 8–23)
CO2: 21 mmol/L — ABNORMAL LOW (ref 22–32)
Calcium: 9.3 mg/dL (ref 8.9–10.3)
Chloride: 109 mmol/L (ref 98–111)
Creatinine, Ser: 2.46 mg/dL — ABNORMAL HIGH (ref 0.61–1.24)
GFR, Estimated: 25 mL/min — ABNORMAL LOW (ref >60)
Glucose, Bld: 111 mg/dL — ABNORMAL HIGH (ref 70–99)
Potassium: 3.9 mmol/L (ref 3.5–5.1)
Sodium: 136 mmol/L (ref 135–145)
Total Bilirubin: 0.4 mg/dL (ref 0.3–1.2)
Total Protein: 6.9 g/dL (ref 6.5–8.1)

## 2022-06-21 LAB — TROPONIN I (HIGH SENSITIVITY): Troponin I (High Sensitivity): 10 ng/L (ref <18)

## 2022-06-21 NOTE — ED Provider Triage Note (Signed)
Emergency Medicine Provider Triage Evaluation Note  Tommy Dyer , a 86 y.o. male  was evaluated in triage.  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  Patient reports the pain was retrosternal.  Nonradiating.  No shortness of breath.  Patient reports some lightheadedness and dizziness.  Patient took 1 nitro and resolved pain.  Patient denies any nausea vomiting or diaphoresis.  History of PCI 2 years ago..  Review of Systems  Positive: Chest pain, lightheadedness Negative: Vomiting, leg swelling, shortness of breath  Physical Exam  BP 126/70 (BP Location: Right Arm)   Pulse 60   Temp 98.2 F (36.8 C) (Oral)   Resp 18   SpO2 97%  Gen:   Awake, no distress   Resp:  Normal effort  MSK:   Moves extremities without difficulty  Other:  Heart regular rate and rhythm.  Lungs clear to auscultation bilaterally.  Medical Decision Making  Medically screening exam initiated at 9:37 PM.  Appropriate orders placed.  Tommy Dyer was informed that the remainder of the evaluation will be completed by another provider, this initial triage assessment does not replace that evaluation, and the importance of remaining in the ED until their evaluation is complete.  Patient presents for chest pain that has resolved after 1 nitro at home.  History of PCI in the past.  Vital signs reassuring.  EKG shows normal sinus rhythm without any ischemic changes.  Labs and imaging pending at this time.   Doristine Devoid, PA-C 06/21/22 2138

## 2022-06-21 NOTE — ED Triage Notes (Signed)
Patient from home, had chest pain and took one sl nitro, which resolved his pain.  Patient denies any nausea or vomiting.  Patient with stent placement 2 years ago.  Patient states this was the first time he has ever taken his nitro for his chest pain, usually tylenol works for his pain, but it did not today.

## 2022-06-22 DIAGNOSIS — I10 Essential (primary) hypertension: Secondary | ICD-10-CM | POA: Diagnosis not present

## 2022-06-22 DIAGNOSIS — E7800 Pure hypercholesterolemia, unspecified: Secondary | ICD-10-CM | POA: Diagnosis not present

## 2022-06-22 DIAGNOSIS — N1832 Chronic kidney disease, stage 3b: Secondary | ICD-10-CM | POA: Diagnosis not present

## 2022-06-22 DIAGNOSIS — K219 Gastro-esophageal reflux disease without esophagitis: Secondary | ICD-10-CM | POA: Diagnosis not present

## 2022-06-22 DIAGNOSIS — I480 Paroxysmal atrial fibrillation: Secondary | ICD-10-CM | POA: Diagnosis not present

## 2022-06-22 LAB — TROPONIN I (HIGH SENSITIVITY): Troponin I (High Sensitivity): 9 ng/L (ref <18)

## 2022-06-22 NOTE — Discharge Instructions (Signed)
Your work up in the ED was reassuring.  We do believe you necessitate follow-up with your cardiologist within the next 30 days.  Continue your daily prescribed medications.  If pain becomes worse or more persistent, or should you develop any other concerning symptoms, promptly return to the ED for repeat evaluation.

## 2022-06-22 NOTE — ED Provider Notes (Signed)
[facility] EMERGENCY DEPARTMENT Provider Note   CSN: [id number] Arrival date & time: 06/21/22  2108     History {Add pertinent medical, surgical, social history, OB history to HPI:1} Chief Complaint  Patient presents with   Chest Pain    Tommy Dyer is a 86 y.o. male.  86 year old male with hx of CAD and STEMI (s/p DES to second OM in 2021), HTN, HLD, CKD stage IV, prostate cancer and history of A-fib (on chronic Eliquis) presents to the emergency department for evaluation of chest pain.  He describes a central chest tightness that began around 1900 tonight while at rest.  He took a nitroglycerin approximately 1 hour later with resolution of his discomfort.  Does note some associated lightheadedness.  No nausea, vomiting, shortness of breath, diaphoresis, jaw pain, leg swelling, recent fever, syncope.  He has remained chest pain-free.  Spent the majority of the afternoon detailing his car.  Reports that his symptoms feel different compared to his prior MI.  The history is provided by the patient. No language interpreter was used.  Chest Pain      Home Medications Prior to Admission medications   Medication Sig Start Date End Date Taking? Authorizing Provider  acetaminophen (TYLENOL) 500 MG tablet Take 1,000 mg by mouth every 6 (six) hours as needed for headache or moderate pain.    [provider]  atorvastatin (LIPITOR) 80 MG tablet Take 1 tablet (80 mg total) by mouth daily. 03/28/22   Martinique, Peter M, MD  Carboxymethylcellulose Sodium (REFRESH TEARS OP) Place 1 drop into both eyes daily as needed (dry eyes).     [provider]  ELIQUIS 2.5 MG TABS tablet TAKE 1 TABLET TWICE DAILY 04/20/22   Martin[place], Peter M, MD  Famotidine (PEPCID AC PO) Take 1 tablet by mouth daily as needed (heartburn).    [provider]  fluticasone (FLONASE) 50 MCG/ACT nasal spray Place 1 spray into both nostrils daily as needed for allergies or rhinitis.      [provider]  irbesartan (AVAPRO) 150 MG tablet Take 150 mg by mouth daily.    [provider]  Lidocaine-Glycerin (PREPARATION H EX) Place 1 application rectally daily as needed (hemorrhoids/pain/itching).     [provider]  meclizine (ANTIVERT) 25 MG tablet Take 25 mg by mouth daily as needed for dizziness. 03/25/20   [provider]  metoprolol succinate (TOPROL-XL) 50 MG 24 hr tablet Take 1 tablet (50 mg total) by mouth daily. Take with or immediately following a meal. 03/16/20   Strader, [place] M, PA-C  nitroGLYCERIN (NITROSTAT) 0.4 MG SL tablet Place 1 tablet (0.4 mg total) under the tongue every 5 (five) minutes x 3 doses as needed for chest pain. 04/06/22   Almyra Deforest, PA  tamsulosin (FLOMAX) 0.4 MG CAPS capsule Take 0.4 mg by mouth every other day.    [provider]      Allergies    Patient has no known allergies.    Review of Systems   Review of Systems  Cardiovascular:  Positive for chest pain.  Ten systems reviewed and are negative for acute change, except as noted in the HPI.    Physical Exam Updated Vital Signs BP (!) 145/79   Pulse 62   Temp 97.8 F (36.6 C) (Oral)   Resp 14   SpO2 98%   Physical Exam Vitals and nursing note reviewed.  Constitutional:      General: He is not in acute distress.  Appearance: He is well-developed. He is not diaphoretic.     Comments: Nontoxic appearing, pleasant  HENT:     Head: Normocephalic and atraumatic.  Eyes:     General: No scleral icterus.    Conjunctiva/sclera: Conjunctivae normal.  Cardiovascular:     Rate and Rhythm: Normal rate and regular rhythm.     Pulses: Normal pulses.  Pulmonary:     Effort: Pulmonary effort is normal. No respiratory distress.     Breath sounds: No stridor.     Comments: Respirations even and unlabored Musculoskeletal:        General: Normal range of motion.     Cervical back: Normal range of motion.  Skin:    General: Skin is warm and  dry.     Coloration: Skin is not pale.     Findings: No erythema or rash.  Neurological:     Mental Status: He is alert and oriented to person, place, and time.     Coordination: Coordination normal.  Psychiatric:        Behavior: Behavior normal.     ED Results / Procedures / Treatments   Labs (all labs ordered are listed, but only abnormal results are displayed) Labs Reviewed  CBC WITH DIFFERENTIAL/PLATELET - Abnormal; Notable for the following components:      Result Value   Hemoglobin 12.1 (*)    HCT 34.4 (*)    MCV 78.7 (*)    RDW 16.0 (*)    All other components within normal limits  COMPREHENSIVE METABOLIC PANEL - Abnormal; Notable for the following components:   CO2 21 (*)    Glucose, Bld 111 (*)    BUN 31 (*)    Creatinine, Ser 2.46 (*)    Albumin 3.2 (*)    GFR, Estimated 25 (*)    All other components within normal limits  TROPONIN I (HIGH SENSITIVITY)  TROPONIN I (HIGH SENSITIVITY)    EKG EKG Interpretation  Date/Time:  Tuesday June 21 2022 21:32:55 EDT Ventricular Rate:  60 PR Interval:  182 QRS Duration: 82 QT Interval:  402 QTC Calculation: 402 R Axis:   33 Text Interpretation: Normal sinus rhythm Normal ECG When compared with ECG of 09-Sep-2021 16:52, HEART RATE has decreased Confirmed by Delora Fuel ([id number]) on 06/22/2022 12:05:01 AM  Radiology DG Chest 2 View  Result Date: 06/21/2022 CLINICAL DATA:  Chest pain EXAM: CHEST - 2 VIEW COMPARISON:  08/30/2021 FINDINGS: The heart size and mediastinal contours are within normal limits. Both lungs are clear. The visualized skeletal structures are unremarkable. IMPRESSION: No active cardiopulmonary disease. Electronically Signed   By: Donavan Foil M.D.   On: 06/21/2022 22:01    Procedures Procedures  {Document cardiac monitor, telemetry assessment procedure when appropriate:1}  Medications Ordered in ED Medications - No data to display  ED Course/ Medical Decision Making/ A&P                            Medical Decision Making  This patient presents to the ED for concern of chest pain, this involves an extensive number of treatment options, and is a complaint that carries with it a high risk of complications and morbidity.  The differential diagnosis includes ACS vs PNA vs CHF exacerbation vs PTX vs Afib with RVR vs MSK vs anxiety   Co morbidities that complicate the patient evaluation  CAD HTN HLD CKD IV   Additional history obtained:  Additional history obtained from wife  at bedside External records from outside source obtained and reviewed including prior heart catheterization from 2021   Lab Tests:  I Ordered, and personally interpreted labs.  The pertinent results include:  Stable anemia of 12.1, Creatinine of 2.46 c/w CKD (up from 2.13 three wks ago). Troponin negative x 2   Imaging Studies ordered:  I ordered imaging studies including CXR  I independently visualized and interpreted imaging which showed no acute cardiopulmonary abnormality I agree with the radiologist interpretation   Cardiac Monitoring:  The patient was maintained on a cardiac monitor.  I personally viewed and interpreted the cardiac monitored which showed an underlying rhythm of: NSR   Medicines ordered and prescription drug management:  I have reviewed the patients home medicines and have made adjustments as needed   Test Considered:  PT/INR   Critical Interventions:  ***   Consultations Obtained:  I requested consultation with the ***,  and discussed lab and imaging findings as well as pertinent plan - they recommend: ***   Problem List / ED Course:  ***   Reevaluation:  After the interventions noted above, I reevaluated the patient and found that they have :{resolved/improved/worsened:23923::"improved"}   Social Determinants of Health:  ***   Dispostion:  After consideration of the diagnostic results and the patients response to treatment, I feel that the  patent would benefit from ***.    {Document critical care time when appropriate:1} {Document review of labs and clinical decision tools ie heart score, Chads2Vasc2 etc:1}  {Document your independent review of radiology images, and any outside records:1} {Document your discussion with family members, caretakers, and with consultants:1} {Document social determinants of health affecting pt's care:1} {Document your decision making why or why not admission, treatments were needed:1} Final Clinical Impression(s) / ED Diagnoses Final diagnoses:  Nonspecific chest pain    Rx / DC Orders ED Discharge Orders     None

## 2022-06-23 ENCOUNTER — Other Ambulatory Visit: Payer: Self-pay | Admitting: Cardiology

## 2022-06-23 ENCOUNTER — Telehealth: Payer: Self-pay

## 2022-06-23 DIAGNOSIS — R079 Chest pain, unspecified: Secondary | ICD-10-CM

## 2022-06-23 DIAGNOSIS — R072 Precordial pain: Secondary | ICD-10-CM

## 2022-06-23 DIAGNOSIS — I2510 Atherosclerotic heart disease of native coronary artery without angina pectoris: Secondary | ICD-10-CM

## 2022-06-23 DIAGNOSIS — R0789 Other chest pain: Secondary | ICD-10-CM

## 2022-06-23 NOTE — Telephone Encounter (Signed)
Spoke to patient Dr.Jordan advised needs a lexiscan myoview.Lexiscan scheduled 10/5 at 8:00 am.

## 2022-06-24 ENCOUNTER — Telehealth: Payer: Self-pay | Admitting: Cardiology

## 2022-06-24 NOTE — Patient Outreach (Signed)
Aging Gracefully Program  06/24/2022  Tommy Dyer 04/28/1936 [id number]   [facility] Evaluation Interviewer attempted to call patient on today regarding Aging Gracefully referral. No answer from patient after multiple rings. CMA left confidential voicemail for patient to return call.  Will attempt to call back within 1 week.   [place] Management Assistant [phone]

## 2022-06-24 NOTE — Telephone Encounter (Signed)
Pt asking to speak with Ellison Carwin about pt assistance on eliquis. Pt states his application was approved and wants to know what he needs to do next. Informed him Ellison Carwin is out of office today.

## 2022-06-30 ENCOUNTER — Ambulatory Visit (HOSPITAL_COMMUNITY)
Admission: RE | Admit: 2022-06-30 | Discharge: 2022-06-30 | Disposition: A | Discharge status: Home / Self Care | Payer: Medicare HMO | Source: Ambulatory Visit | Attending: Cardiology | Admitting: Cardiology

## 2022-06-30 DIAGNOSIS — I2510 Atherosclerotic heart disease of native coronary artery without angina pectoris: Secondary | ICD-10-CM | POA: Diagnosis not present

## 2022-06-30 DIAGNOSIS — R072 Precordial pain: Secondary | ICD-10-CM | POA: Diagnosis not present

## 2022-06-30 LAB — MYOCARDIAL PERFUSION IMAGING
LV dias vol: 108 mL (ref 62–150)
LV sys vol: 50 mL
Nuc Stress EF: 53 %
Peak HR: 87 {beats}/min
Rest HR: 55 {beats}/min
Rest Nuclear Isotope Dose: 10.4 mCi
SDS: 3
SRS: 5
SSS: 8
ST Depression (mm): 0 mm
Stress Nuclear Isotope Dose: 30.8 mCi
TID: 0.97

## 2022-06-30 MED ORDER — TECHNETIUM TC 99M TETROFOSMIN IV KIT
30.8000 | PACK | Freq: Once | INTRAVENOUS | Status: AC | PRN
  Administered 2022-06-30: 30.8 via INTRAVENOUS

## 2022-06-30 MED ORDER — REGADENOSON 0.4 MG/5ML IV SOLN
0.4000 mg | Freq: Once | INTRAVENOUS | Status: AC
  Administered 2022-06-30: 0.4 mg via INTRAVENOUS

## 2022-06-30 MED ORDER — TECHNETIUM TC 99M TETROFOSMIN IV KIT
10.4000 | PACK | Freq: Once | INTRAVENOUS | Status: AC | PRN
  Administered 2022-06-30: 10.4 via INTRAVENOUS

## 2022-07-09 DIAGNOSIS — Z23 Encounter for immunization: Secondary | ICD-10-CM | POA: Diagnosis not present

## 2022-07-12 ENCOUNTER — Other Ambulatory Visit: Payer: Self-pay

## 2022-07-12 NOTE — Patient Outreach (Signed)
Aging Gracefully Program  07/12/2022  Anthoni Geerts 1935/10/12 [id number]   [facility] Evaluation Interviewer made contact with patient. Aging Gracefully survey completed.   Interviewer will send referral to RN and OT for follow up.   [place] Management Assistant [phone]

## 2022-07-18 ENCOUNTER — Ambulatory Visit: Payer: Medicare HMO | Admitting: Physician Assistant

## 2022-08-08 ENCOUNTER — Other Ambulatory Visit: Payer: Self-pay | Admitting: Occupational Therapy

## 2022-08-08 NOTE — Patient Instructions (Signed)
Goals:   Goals Addressed                       This Visit's Progress     Patient Stated            Safety in shower: Hand held shower, grab bar on outside of shower to help step in and out.       Patient Stated            Safety at toilet: grab bar on left hand side of toilet as you sit on it.       Patient Stated            Safety in and out of house at front door (railings on both sides of steps.       Patient Stated            Ease getting up from couch: platform under couch to build it up.

## 2022-08-08 NOTE — Patient Outreach (Signed)
Aging Gracefully Program  OT Initial Visit  08/08/2022  Kalven Ganim [birth year] [id number]  Visit:  1- Initial Visit  Start Time:  3244 End Time:  1400 Total Minutes:  73  CCAP: Typical Daily Routine: Typical Daily Routine:: Gets up  not too early or too late. Eats breakfast,. Depending on days tasks and weather may work in yard, run errands, stay inside and watch TV, MD appointments. What Types Of Care Problems Are You Having Throughout The Day?: none really overall What Kind Of Help Do You Receive?: none Do You Think You Need Other Types Of Help?: no What Do You Think Would Make Everyday Life Easier For You?: safer rail at front door, grab bars and hand held showerr in batthroom, grab bar next to toilet Do You Have Time For Yourself?: yes Patient Reported Equipment: Patient Reported Equipment Currently Used:  (none) Functional Mobility-Stooping, Crouching, Kneeling To Retreive Item: Stooping, Crouching, or Kneeling To Retrieve Item: A Little Difficulty (intermittent vertigo) Do You:: No Device/No Assistance Importance Of Learning New Strategies:: A Little Intervention: Yes Other Comments:: reacher Functional Mobility-Bending From Standing Position To Pick Up Clothing Off The Floor: Bending Over From Standing Position To Pick Up Clothing Off The Floor: A Little Difficulty (intermittent vertigo) Do You:: No Device/No Assistance Importance Of Learning New Strategies:: A Little Intervention: Yes Other Comments:: reacher Functional Mobility-Move In And Out Of Chair: Move In and Out Of A Chair: A Little Difficulty (from couch he normally sits on --low) Do You:: No Device/No Assistance Importance Of Learning New Strategies:: A Little Intervention: Yes Other Comments:: platfiorm under couch  Readiness To Change Score:  Readiness to Change Score: 10  Home Environment Assessment: Outside Home Entry:: Front of house entry has 5-6 steps with one rail that is not very secure. Also  a short threshold step into the house. Kitchen:: [employer] are leaning/sagging on the right hand side as you face them--he has propped them up with a 2 x 4. Bathroom:: Master bath has a tub/shower combination without grab bar and without a hand held shower. Comfort height toilet without grab bars. Rudolpho Sevin has a tub shower combination, no handheld shower and no grab bars. Also has a comfort height toilet without grab bars. The faucet in the hallway bath is very low flow Basement:: Walk in crawl space--Mr. Melott is concerned about the electrical down there. Other Home Environment Concerns:: The deck posts  off of Mrs. Massey's bedroom look like they have been eaten away/weathered at bottom. Leaking at [place], Leaking of roof at back deck under deck roofing onto porch (buckets to catch water). Water faucet at back of house leaks.   Goals:  Goals Addressed             This Visit's Progress    Patient Stated       Safety in shower: Hand held shower, grab bar on outside of shower to help step in and out.     Patient Stated       Safety at toilet: grab bar on left hand side of toilet as you sit on it.     Patient Stated       Safety in and out of house at front door (railings on both sides of steps.     Patient Stated       Ease getting up from couch: platform under couch to build it up.        Post Clinical Reasoning: Clinician View Of Client Situation:: Mr. Januszewski  is doing well overall. He had an MI earlier this year and has recently been treated for prostrate cancer. He occassionally goes to a program at the [facility] with [employer], but usually just walks around the neighborhood. He is inolved in church. Client View Of His/Her Situation:: Mr. Ranta feels he is doing well for his age and I would agree--he looks more like 86 years old than 86 years old. Next Visit Plan:: Education  [place], OTR/L Acute Rehab Services Aging Gracefully [phone] Office  [phone]

## 2022-08-09 ENCOUNTER — Telehealth: Payer: Self-pay

## 2022-08-09 NOTE — Patient Outreach (Signed)
Aging Gracefully Program  08/09/2022  Tommy Dyer [birth year] [id number]   Placed call to patient and wife to schedule home visit. Left a message and requested a call back to schedule.  [name] RN, BSN, Careers information officer for [employer] Mobile: [phone]

## 2022-08-10 ENCOUNTER — Telehealth: Payer: Self-pay

## 2022-08-10 NOTE — Patient Outreach (Signed)
Aging Gracefully Program  08/10/2022  Jaimin Krupka Jan 12, 1936 [id number]  Incoming call from patients wife.  Reviewed purpose of call. Offered to scheduled initial home visit and wife accepted home visit for 1030 on 08/16/2022.  [name] RN, BSN, Careers information officer for [employer] Mobile: [phone]

## 2022-08-16 ENCOUNTER — Other Ambulatory Visit: Payer: Self-pay

## 2022-08-16 NOTE — Patient Outreach (Signed)
Aging Gracefully Program  RN Visit  08/16/2022  Tommy Dyer 12/12/1935 [id number]  Visit:   RN home visit #1  Start Time:   1030 End Time:   1200 Total Minutes:   90  Readiness To Change Score:     Universal RN Interventions: Calendar Distribution: Yes Exercise Review: No Medications: Yes Medication Changes: Yes Mood: Yes Pain: Yes PCP Advocacy/Support: No Fall Prevention: Yes Incontinence: Yes Clinician View Of Client Situation: Home neat and clean.  Wife present during home visit.  Ambulatory without diffiuclty. Keeping logs of meds and BP Client View Of His/Her Situation: Patient reports that he is doing well. Enjoys being outisde.  Use to work 3rd shift so he is up later and sleeping longer in the am.  Reports constipation is his biggest concern.  Healthcare Provider Communication: Did Higher education careers adviser With [employer] Provider?: No According to Client, Did PCP Report Communication With An Aging Gracefully RN?: No  Clinician View of Client Situation: Clinician View Of Client Situation: Home neat and clean.  Wife present during home visit.  Ambulatory without diffiuclty. Keeping logs of meds and BP Client's View of His/Her Situation: Client View Of His/Her Situation: Patient reports that he is doing well. Enjoys being outisde.  Use to work 3rd shift so he is up later and sleeping longer in the am.  Reports constipation is his biggest concern.  Medication Assessment: Do You Have Any Problems Paying For Medications?: No Where Does Client Store Medications?: Other: Chief Executive Officer) Can Client Read Pill Bottles?: No Does Client Use A Pillbox?: No Does Anyone Assist Client In Taking Medications?: Yes Name Of Person Assisting With Medications: wife reminds him to take his medications Relationship Of Person Assisting With Medications: wife Do You Take Vitamin D?: No Does Client Have Any Questions Or Concerns About Medictions?: No Is Client Complaining Of Any Symptoms  That Could Be Side Effects To Medications?: No Any Possible Changes In Medication Regimen?: No   Outpatient Encounter Medications as of 08/16/2022  Medication Sig   acetaminophen (TYLENOL) 500 MG tablet Take 1,000 mg by mouth every 6 (six) hours as needed for headache or moderate pain.   atorvastatin (LIPITOR) 80 MG tablet Take 1 tablet (80 mg total) by mouth daily.   Carboxymethylcellulose Sodium (REFRESH TEARS OP) Place 1 drop into both eyes daily as needed (dry eyes).    ELIQUIS 2.5 MG TABS tablet TAKE 1 TABLET TWICE DAILY   Famotidine (PEPCID AC PO) Take 1 tablet by mouth daily as needed (heartburn).   fluticasone (FLONASE) 50 MCG/ACT nasal spray Place 1 spray into both nostrils daily as needed for allergies or rhinitis.    irbesartan (AVAPRO) 150 MG tablet Take 150 mg by mouth daily.   Lidocaine-Glycerin (PREPARATION H EX) Place 1 application rectally daily as needed (hemorrhoids/pain/itching).    meclizine (ANTIVERT) 25 MG tablet Take 25 mg by mouth daily as needed for dizziness.   metoprolol succinate (TOPROL-XL) 50 MG 24 hr tablet Take 1 tablet (50 mg total) by mouth daily. Take with or immediately following a meal.   nitroGLYCERIN (NITROSTAT) 0.4 MG SL tablet Place 1 tablet (0.4 mg total) under the tongue every 5 (five) minutes x 3 doses as needed for chest pain.   tamsulosin (FLOMAX) 0.4 MG CAPS capsule Take 0.4 mg by mouth every other day.   No facility-administered encounter medications on file as of 08/16/2022.    OT Update: pending community housing solutions assessment  Session Summary: Nice couple.  Interactive during home visit.  CLIENT/RN ACTION PLAN - GENERIC - (smartphrase AGRNGENERIC)  Registered Nurse:  [name] RN Date  08/16/2022  Client Name:Tommy Dyer Client ID:    Target Area:  GENERIC   Why Problem May Occur:  decrease liquid intake    Target Goal: Patient will report decrease in constipation in the next 120 days.    STRATEGIES Coping  Strategies: Ideas  Constipation                Prevention Ideas   Increase oral hydration    Take colace once a day   Keep a log of bowel movements         PRACTICE It is important to practice the strategies so we can determine if they will be effective in helping to reach the goal.    Follow these specific recommendations:        If strategy does not work the first time, try it again.     We may make some changes over the next few sessions.       [name] RN, BSN, CEN RN Case Freight forwarder for [place] Mobile: [phone]           Goals Addressed             This Visit's Progress    Aging Gracefully RN       Goal: Patient will report decrease in constipation in the next 120 days.  08/16/2022 Assessment:  Frequent problems with constipation. Reports bowel movements every 3 days; occasionally longer.  Interventions: reviewed reason for constipation.  Encouraged patient to take a stool softner ( colace '100mg'$  ) once a day. Reviewed importance of hydration and encouraged patient to drink atleast 4 bottles of water per day.  Reviewed importance of activity. Encouraged patient to keep a log of when he has a bowel movement. Brainstorming with patient,   Plan: next home visit planned for 09/13/2022  [name] RN, BSN, CEN RN Case Manager for [employer] Mobile: [phone]        [name] RN, BSN, Careers information officer for [employer] Mobile: [phone]

## 2022-08-16 NOTE — Patient Instructions (Signed)
Visit Information  Thank you for taking time to visit with me today. Please don't hesitate to contact me if I can be of assistance to you before our next scheduled telephone appointment.  Following are the goals we discussed today:   Goals Addressed             This Visit's Progress    Aging Gracefully RN       Goal: Patient will report decrease in constipation in the next 120 days.  08/16/2022 Assessment:  Frequent problems with constipation. Reports bowel movements every 3 days; occasionally longer.  Interventions: reviewed reason for constipation.  Encouraged patient to take a stool softner ( colace '100mg'$  ) once a day. Reviewed importance of hydration and encouraged patient to drink atleast 4 bottles of water per day.  Reviewed importance of activity. Encouraged patient to keep a log of when he has a bowel movement. Brainstorming with patient,   Plan: next home visit planned for 09/13/2022  [name] RN, BSN, CEN RN Case Manager for [employer] Mobile: [phone]          Our next appointment is  in person visit  on 09/13/2022 at 1130  Please call the care guide team at [phone] if you need to cancel or reschedule your appointment.   If you are experiencing a Mental Health or [place] or need someone to talk to, please call the Suicide and Crisis Lifeline: 988 call the [place] National Suicide Prevention Lifeline: (313)826-6136 or TTY: 435 400 3992 TTY (251) 410-2867) to talk to a trained counselor call 1-800-273-TALK (toll free, 24 hour hotline) go to [facility] [address], [place] [phone]) call 911   The patient verbalized understanding of instructions, educational materials, and care plan provided today and agreed to receive a mailed copy of patient instructions, educational materials, and care plan.   [name] RN, BSN, Careers information officer for [employer] Mobile: [phone]

## 2022-08-29 ENCOUNTER — Other Ambulatory Visit: Payer: Self-pay | Admitting: Cardiology

## 2022-09-02 ENCOUNTER — Telehealth: Payer: Self-pay | Admitting: Cardiology

## 2022-09-02 NOTE — Telephone Encounter (Signed)
Patient called stating he dropped off paperwork for renewing his Eliquis for patient assistance last week.  He wants to make sure it was received by Ellison Carwin.

## 2022-09-02 NOTE — Telephone Encounter (Signed)
Called patient and informed him that I faxed all of the information to the foundation yesterday. Waiting to hear back. Also asked if he wanted me to mail back his information and he stated that he will come pick it up from the office today or Monday.

## 2022-09-05 ENCOUNTER — Other Ambulatory Visit: Payer: Self-pay

## 2022-09-05 MED ORDER — APIXABAN 2.5 MG PO TABS: 2.5000 mg | ORAL_TABLET | Freq: Two times a day (BID) | ORAL | 1 refills | Status: DC

## 2022-09-13 ENCOUNTER — Other Ambulatory Visit: Payer: Self-pay

## 2022-09-13 DIAGNOSIS — D508 Other iron deficiency anemias: Secondary | ICD-10-CM | POA: Diagnosis not present

## 2022-09-13 DIAGNOSIS — Z7901 Long term (current) use of anticoagulants: Secondary | ICD-10-CM | POA: Diagnosis not present

## 2022-09-13 DIAGNOSIS — K625 Hemorrhage of anus and rectum: Secondary | ICD-10-CM | POA: Diagnosis not present

## 2022-09-13 DIAGNOSIS — I480 Paroxysmal atrial fibrillation: Secondary | ICD-10-CM | POA: Diagnosis not present

## 2022-09-13 DIAGNOSIS — I10 Essential (primary) hypertension: Secondary | ICD-10-CM | POA: Diagnosis not present

## 2022-09-13 NOTE — Patient Instructions (Signed)
Visit Information  Thank you for taking time to visit with me today. Please don't hesitate to contact me if I can be of assistance to you before our next scheduled telephone appointment.  Following are the goals we discussed today:    Goals Addressed             This Visit's Progress    Aging Gracefully RN       Goal: Patient will report decrease in constipation in the next 120 days.  08/16/2022 Assessment:  Frequent problems with constipation. Reports bowel movements every 3 days; occasionally longer.  Interventions: reviewed reason for constipation.  Encouraged patient to take a stool softner ( colace '100mg'$  ) once a day. Reviewed importance of hydration and encouraged patient to drink atleast 4 bottles of water per day.  Reviewed importance of activity. Encouraged patient to keep a log of when he has a bowel movement. Brainstorming with patient,   Plan: next home visit planned for 09/13/2022  [name] RN, BSN, CEN RN Case Manager for [facility] Mobile: [phone]    09/13/2022  Assessment:  Patient states that he has been having significant constipation.  Reports saw MD today.  Patient reports recent bleeding.  Patient states a small amount.Marland Kitchen He thinks is from his hemorrhoids.  Interventions:  reviewed with patient the importance of increasing his water. Taking a stool softner and increasing his fiber.  Plan: next home visit planned for 10/11/2022  [name] RN, BSN, CEN RN Case Manager for [employer] Mobile: [phone]          Our next appointment is on 10/11/2022 at 49  If you are experiencing a Mental Health or [place] or need someone to talk to, please call the Suicide and Crisis Lifeline: 988 call the [place] National Suicide Prevention Lifeline: 614 162 2744 or TTY: 204-672-2006 TTY 346-005-5101) to talk to a trained counselor call 1-800-273-TALK (toll free, 24 hour  hotline) go to [facility] [place] [phone]) call 911   The patient verbalized understanding of instructions, educational materials, and care plan provided today and agreed to receive a mailed copy of patient instructions, educational materials, and care plan.   [name] RN, BSN, Careers information officer for [employer] Mobile: [phone]

## 2022-09-13 NOTE — Patient Outreach (Addendum)
Aging Gracefully Program  RN Visit  09/13/2022  Tommy Dyer 09-27-1935 [id number]  Visit:  RN Visit Number: 2- Second Visit  RN TIME CALCULATION: Start TIme:  RN Start Time Calculation: 1206 End Time:  RN Stop Time Calculation: 1300 Total Minutes:  RN Time Calculation: 74  Readiness To Change Score:     Universal RN Interventions: Calendar Distribution: Yes Exercise Review: Yes Medications: Yes Medication Changes: Yes Mood: Yes Pain: Yes PCP Advocacy/Support: No Fall Prevention: Yes Incontinence: Yes Clinician View Of Client Situation: Home neat and clean.  Wife present duringhome list. Patient just returning from MD visit. Client View Of His/Her Situation: Patient reports that he is constipated and has been passing blood.  Healthcare Provider Communication: Did Higher education careers adviser With [employer] Provider?: No According to Client, Did PCP Report Communication With An Aging Gracefully RN?: No  Clinician View of Client Situation: Clinician View Of Client Situation: Home neat and clean.  Wife present duringhome list. Patient just returning from MD visit. Client's View of His/Her Situation: Client View Of His/Her Situation: Patient reports that he is constipated and has been passing blood.  Medication Assessment: denies any changes to medications.    OT Update: pending home modifications  Session Summary: Patient doing well.    Goals Addressed             This Visit's Progress    Aging Gracefully RN       Goal: Patient will report decrease in constipation in the next 120 days.  08/16/2022 Assessment:  Frequent problems with constipation. Reports bowel movements every 3 days; occasionally longer.  Interventions: reviewed reason for constipation.  Encouraged patient to take a stool softner ( colace '100mg'$  ) once a day. Reviewed importance of hydration and encouraged patient to drink atleast 4 bottles of water per day.  Reviewed importance of activity. Encouraged  patient to keep a log of when he has a bowel movement. Brainstorming with patient,   Plan: next home visit planned for 09/13/2022  [name] RN, BSN, CEN RN Case Manager for [facility] Mobile: [phone]    09/13/2022  Assessment:  Patient states that he has been having significant constipation.  Reports saw MD today.  Patient reports recent bleeding.  Patient states a small amount.Marland Kitchen He thinks is from his hemorrhoids.  Interventions:  reviewed with patient the importance of increasing his water. Taking a stool softner and increasing his fiber.  Plan: next home visit planned for 10/11/2022  [name] RN, BSN, CEN RN Case Manager for [place] Mobile: [phone]        [name] RN, BSN, Careers information officer for [employer] Mobile: [phone]

## 2022-09-16 DIAGNOSIS — N1832 Chronic kidney disease, stage 3b: Secondary | ICD-10-CM | POA: Diagnosis not present

## 2022-09-16 DIAGNOSIS — I480 Paroxysmal atrial fibrillation: Secondary | ICD-10-CM | POA: Diagnosis not present

## 2022-09-16 DIAGNOSIS — I10 Essential (primary) hypertension: Secondary | ICD-10-CM | POA: Diagnosis not present

## 2022-09-16 DIAGNOSIS — E7800 Pure hypercholesterolemia, unspecified: Secondary | ICD-10-CM | POA: Diagnosis not present

## 2022-09-16 DIAGNOSIS — K219 Gastro-esophageal reflux disease without esophagitis: Secondary | ICD-10-CM | POA: Diagnosis not present

## 2022-09-23 ENCOUNTER — Telehealth: Payer: Self-pay | Admitting: Cardiology

## 2022-09-23 NOTE — Telephone Encounter (Signed)
Will route to see if she has seen anything in regards to the patient assistance forms.   Thanks!

## 2022-09-23 NOTE — Telephone Encounter (Signed)
Pt c/o medication issue:  1. Name of Medication: apixaban (ELIQUIS) 2.5 MG TABS tablet   2. How are you currently taking this medication (dosage and times per day)?   3. Are you having a reaction (difficulty breathing--STAT)?   4. What is your medication issue? Pt states he has been working with Ellison Carwin, CMA about pt assistance for this medication. He got a call from [facility] about it but he could not make out the message and would like to see if she heard anything about an update.

## 2022-09-27 NOTE — Telephone Encounter (Signed)
  Pt called back, he said, he will dropped off the paperwork today

## 2022-09-27 NOTE — Telephone Encounter (Signed)
Spoke to patient he stated he dropped off patient assistance paperwork for Eliquis last month.Stated he received a call from [facility] and did not understand th voice mail.He was calling to see if we knew anything about the call.Stated he will call [employer] today.He will call back if we need to do anything regarding the application.

## 2022-09-29 ENCOUNTER — Telehealth: Payer: Self-pay

## 2022-09-29 NOTE — Telephone Encounter (Signed)
Patient walked in office 09/28/22 with new patient assistance paperwork for Eliquis.He stated he has noticed several times small amount of bright red blood in stool.Stated he saw PCP several weeks ago and was told to hold Eliquis.Stated he never held Eliquis.He wanted Dr.Jordan to know. Spoke to patient this morning,he stated he did not notice in blood in stool.Stated he will call PCP if he notices anymore blood.I will make Dr.Jordan aware. Patient assistance paperwork for Eliquis faxed to [employer] at fax # [phone].

## 2022-10-03 ENCOUNTER — Other Ambulatory Visit: Payer: Self-pay | Admitting: Occupational Therapy

## 2022-10-03 NOTE — Patient Outreach (Signed)
Aging Gracefully Program  OT Follow-Up Visit  10/03/2022  Refugio Vandevoorde [birth year] [id number]  Visit:  2- Second Visit  Start Time:  1100 End Time:  1120 Total Minutes:  20  Readiness to Change Score :  Readiness to Change Score: 10  Patient Education: Education Provided: Yes Education Details: How to get up from a fall handout, Safety at [employer]. I demonstrated the method on the handout of how to get up from a fall as well as the scooting backward method on one's behind. We went verbally over the other 2 handouts. Person(s) Educated: Patient Comprehension: Verbalized Understanding  Goals:   Goals Addressed             This Visit's Progress    COMPLETED: Patient Stated       Ease getting up from couch: platform under couch to build it up. Upon discussion with Gene at [facility] decided they did not want the riser built for the couch.        Post Clinical Reasoning: Clinician View Of Client Situation:: Mr. Barabas continues to do well and is looking forward to the work being done on their house. Client View Of His/Her Situation:: He did not have any complaints Next Visit Plan:: She how home modifications are working out  [employer], OTR/L [place] [phone] Office [phone]

## 2022-10-03 NOTE — Patient Instructions (Signed)
Ease getting up from couch: platform under couch to build it up. Upon discussion with Tommy Dyer at [place] decided they did not want the riser built for the couch.

## 2022-10-06 DIAGNOSIS — C61 Malignant neoplasm of prostate: Secondary | ICD-10-CM | POA: Diagnosis not present

## 2022-10-06 DIAGNOSIS — D509 Iron deficiency anemia, unspecified: Secondary | ICD-10-CM | POA: Diagnosis not present

## 2022-10-06 DIAGNOSIS — I480 Paroxysmal atrial fibrillation: Secondary | ICD-10-CM | POA: Diagnosis not present

## 2022-10-06 DIAGNOSIS — I129 Hypertensive chronic kidney disease with stage 1 through stage 4 chronic kidney disease, or unspecified chronic kidney disease: Secondary | ICD-10-CM | POA: Diagnosis not present

## 2022-10-06 DIAGNOSIS — I2510 Atherosclerotic heart disease of native coronary artery without angina pectoris: Secondary | ICD-10-CM | POA: Diagnosis not present

## 2022-10-06 DIAGNOSIS — N1832 Chronic kidney disease, stage 3b: Secondary | ICD-10-CM | POA: Diagnosis not present

## 2022-10-11 ENCOUNTER — Other Ambulatory Visit: Payer: Self-pay

## 2022-10-11 NOTE — Patient Instructions (Signed)
Visit Information  Thank you for taking time to visit with me today. Please don't hesitate to contact me if I can be of assistance to you before our next scheduled telephone appointment.  Following are the goals we discussed today:   Goals Addressed             This Visit's Progress    Aging Gracefully RN       Goal: Patient will report decrease in constipation in the next 120 days.  08/16/2022 Assessment:  Frequent problems with constipation. Reports bowel movements every 3 days; occasionally longer.  Interventions: reviewed reason for constipation.  Encouraged patient to take a stool softner ( colace '100mg'$  ) once a day. Reviewed importance of hydration and encouraged patient to drink atleast 4 bottles of water per day.  Reviewed importance of activity. Encouraged patient to keep a log of when he has a bowel movement. Brainstorming with patient,   Plan: next home visit planned for 09/13/2022  [name] RN, BSN, CEN RN Case Manager for [facility] Mobile: [phone]    09/13/2022  Assessment:  Patient states that he has been having significant constipation.  Reports saw MD today.  Patient reports recent bleeding.  Patient states a small amount.Marland Kitchen He thinks is from his hemorrhoids.  Interventions:  reviewed with patient the importance of increasing his water. Taking a stool softner and increasing his fiber.  Plan: next home visit planned for 10/11/2022  [name] RN, BSN, CEN RN Case Manager for [facility] Mobile: [phone]   10/11/2022  Assessment:  Super proud of patient. He is taking colace daily.denies recent constipation. Denies any additional rectal bleeding.  Reports he does not have to push hard any more to have a bowel movement.  Interventions: encouraged patient to continue to take his colace daily or twice day. Encouraged patient to continue to be active. Reviewed importance of home exercise  program.  Encouraged patient to continue to eat and drink well.  Placed call to [facility] to help patient understand his medication change.   Plan: follow up planned for 11/15/2022.  Encouraged patient to call me if needed sooner.    [name] RN, BSN, CEN RN Case Freight forwarder for [facility] [employer] Mobile: [phone]          Our next appointment is on 11/15/2022 at 11am  If you are experiencing a [place] or [place] or need someone to talk to, please call the Suicide and Crisis Lifeline: 988 call the [place] National Suicide Prevention Lifeline: (670)588-2335 or TTY: 209-153-3023 TTY 506-757-4383) to talk to a trained counselor call 1-800-273-TALK (toll free, 24 hour hotline) go to [facility] [place] [phone]) call 911   The patient verbalized understanding of instructions, educational materials, and care plan provided today and agreed to receive a mailed copy of patient instructions, educational materials, and care plan.   [name] RN, BSN, Careers information officer for [employer] Mobile: [phone]

## 2022-10-11 NOTE — Progress Notes (Signed)
Cardiology Office Note:    Date:  10/17/2022   ID:  Tommy Dyer, DOB [birth year], MRN [id number]  PCP:  [name], [place] [facility] Cardiologist:  Taraann Olthoff [place], MD     Referring MD: [name], MD   Chief Complaint  Patient presents with   Coronary Artery Disease    History of Present Illness:    Tommy Dyer is a 87 y.o. male with a hx of CAD, HTN, HLD, CKD stage IV, prostate cancer and history of A-fib.  Patient had lateral STEMI in June 2021 and found to have 100% occluded second marginal branch treated with DES, he has 80% mid LAD and 40% proximal LAD residual. Recommendation is if he has refractory angina, can consider PCI of LAD at a later time.  Echocardiogram obtained on 03/14/2020 showed EF 55 to 60%, grade 1 DD.  He was discharged on Toprol-XL, amlodipine, Brilinta and aspirin.  He came back to the ED on 03/26/2020 with dizziness.  EKG demonstrated atrial fibrillation with RVR with heart rate of 131.  Plavix was started to replace Brilinta and Eliquis was started at 2.5 mg twice a day.  Hospital course complicated by AKI with serum creatinine of 2.86, baseline creatinine is about 2.5.  He was seen in the ED in December 2022 with COVID and was treated with molnupiravir.    He is being followed by Dr. Santiago Bumpers of [facility].  He was seen in July 2023 with atypical chest pain. This recurred in the fall   Myoview was done showing preserved EF, fixed inferolateral scar and no ischemia. Medical management recommended.   He does note Dr Joylene Grapes increased his irbesartan one week ago. Has noted a little lightheadedness since then. Notes BP usually 120-130s. [facility] Denies any chest pain or dyspnea.    Past Medical History:  Diagnosis Date   Arthritis    CAD (coronary artery disease)    a. 02/2020: STEMI with DES to 100% 2nd Mrg stenosis. Residual 80% mid-LAD stenosis with consideration of staged PCI  recommended.     Gastric ulcer    GERD (gastroesophageal reflux disease)    History of kidney stones    Hypercholesterolemia    Hypertension    Kidney stones    Osteoporosis    Prostate cancer ([place])    STEMI (ST elevation myocardial infarction) [facility])     Past Surgical History:  Procedure Laterality Date   colonscopy     CORONARY/GRAFT ACUTE MI REVASCULARIZATION N/A 03/13/2020   Procedure: CORONARY/GRAFT ACUTE MI REVASCULARIZATION;  Surgeon: Martinique, Alissah Redmon M, MD;  Location: [facility];  Service: Cardiovascular;  Laterality: N/A;   EXTRACORPOREAL SHOCK WAVE LITHOTRIPSY Right 06/21/2018   Procedure: RIGHT EXTRACORPOREAL SHOCK WAVE LITHOTRIPSY (ESWL);  Surgeon: Ardis Hughs, MD;  Location: [facility];  Service: Urology;  Laterality: Right;   LEFT HEART CATH AND CORONARY ANGIOGRAPHY N/A 03/13/2020   Procedure: LEFT HEART CATH AND CORONARY ANGIOGRAPHY;  Surgeon: Martinique, Delray Reza M, MD;  Location: [facility];  Service: Cardiovascular;  Laterality: N/A;   PROSTATE BIOPSY     PROSTATE BIOPSY      Current Medications: Current Meds  Medication Sig   acetaminophen (TYLENOL) 500 MG tablet Take 1,000 mg by mouth every 6 (six) hours as needed for headache or moderate pain.   apixaban (ELIQUIS) 2.5 MG TABS tablet Take 1 tablet (2.5 mg total) by mouth 2 (two) times daily.  atorvastatin (LIPITOR) 80 MG tablet TAKE 1 TABLET EVERY DAY   Carboxymethylcellulose Sodium (REFRESH TEARS OP) Place 1 drop into both eyes daily as needed (dry eyes).    Famotidine (PEPCID AC PO) Take 1 tablet by mouth daily as needed (heartburn).   fluticasone (FLONASE) 50 MCG/ACT nasal spray Place 1 spray into both nostrils daily as needed for allergies or rhinitis.    irbesartan (AVAPRO) 300 MG tablet Take 300 mg by mouth daily.   Lidocaine-Glycerin (PREPARATION H EX) Place 1 application rectally daily as needed (hemorrhoids/pain/itching).    meclizine (ANTIVERT) 25 MG tablet Take 25 mg by mouth daily as needed  for dizziness.   metoprolol succinate (TOPROL-XL) 50 MG 24 hr tablet Take 1 tablet (50 mg total) by mouth daily. Take with or immediately following a meal.   nitroGLYCERIN (NITROSTAT) 0.4 MG SL tablet Place 1 tablet (0.4 mg total) under the tongue every 5 (five) minutes x 3 doses as needed for chest pain.   tamsulosin (FLOMAX) 0.4 MG CAPS capsule Take 0.4 mg by mouth every other day.   [DISCONTINUED] irbesartan (AVAPRO) 150 MG tablet Take 300 mg by mouth daily.     Allergies:   Patient has no known allergies.   Social History   Socioeconomic History   Marital status: Married    Spouse name: Parke Simmers   Number of children: Not on file   Years of education: Not on file   Highest education level: Not on file  Occupational History   Not on file  Tobacco Use   Smoking status: Former    Packs/day: 1.00    Years: 20.00    Total pack years: 20.00    Types: Cigarettes    Quit date: 09/26/1993    Years since quitting: 29.0   Smokeless tobacco: Never  Vaping Use   Vaping Use: Never used  Substance and Sexual Activity   Alcohol use: No   Drug use: No   Sexual activity: Not on file  Other Topics Concern   Not on file  Social History Narrative   Retired from [employer] Strain: Not on file  Food Insecurity: Not on file  Transportation Needs: Not on file  Physical Activity: Not on file  Stress: Not on file  Social Connections: Not on file     Family History: The patient's family history includes Breast cancer in his maternal grandmother; Cancer in his mother; Diabetes in his father and mother; High blood pressure in his father and mother. There is no history of Colon cancer or Stomach cancer.  ROS:   Please see the history of present illness.     All other systems reviewed and are negative.  EKGs/Labs/Other Studies Reviewed:    The following studies were reviewed today:  Echo 03/14/2020 1. Left ventricular ejection fraction, by  estimation, is 55 to 60%. The  left ventricle has normal function. The left ventricle has no regional  wall motion abnormalities. Left ventricular diastolic parameters are  consistent with Grade I diastolic  dysfunction (impaired relaxation). Elevated left atrial pressure.   2. Right ventricular systolic function is normal. The right ventricular  size is normal.   3. The mitral valve is normal in structure. No evidence of mitral valve  regurgitation. No evidence of mitral stenosis.   4. The aortic valve is normal in structure. Aortic valve regurgitation is  not visualized. Mild to moderate aortic valve sclerosis/calcification is  present, without any evidence of aortic  stenosis.   5. The inferior vena cava is normal in size with greater than 50%  respiratory variability, suggesting right atrial pressure of 3 mmHg.   Myoview 06/30/22: Study Highlights      No ST deviation was noted.   LV perfusion is abnormal. There is no evidence of ischemia. There is evidence of infarction. Defect 1: There is a medium defect with severe reduction in uptake present in the mid to basal inferolateral location(s) that is fixed. There is normal wall motion in the defect area. Consistent with infarction.   Left ventricular function is normal.  EF 53%.  End diastolic cavity size is normal.   Findings are consistent with prior myocardial infarction in the inferolateral distribution. The study is overall low risk.  There is no significant ischemia identified.  EKG:  EKG is not ordered today.    Recent Labs: 06/21/2022: ALT 44; BUN 31; Creatinine, Ser 2.46; Hemoglobin 12.1; Platelets 153; Potassium 3.9; Sodium 136  Recent Lipid Panel    Component Value Date/Time   CHOL 124 05/12/2020 0843   TRIG 91 05/12/2020 0843   HDL 27 (L) 05/12/2020 0843   CHOLHDL 4.6 05/12/2020 0843   CHOLHDL 7.3 03/14/2020 0414   VLDL 24 03/14/2020 0414   LDLCALC 79 05/12/2020 0843   Dated 05/10/22: A1c 6.3%. cholesterol 108,  triglycerides 126, HDL 28, LDL 58.  Dated 09/13/22: BUN 32, creatinine 2.23. otherwise CMET OK.   Risk Assessment/Calculations:    CHA2DS2-VASc Score = 4   This indicates a 4.8% annual risk of stroke. The patient's score is based upon: CHF History: 0 HTN History: 1 Diabetes History: 0 Stroke History: 0 Vascular Disease History: 1 Age Score: 2 Gender Score: 0          Physical Exam:    VS:  BP (!) 153/81 (BP Location: Left Arm, Patient Position: Sitting, Cuff Size: Normal)   Pulse 82   Ht 6' (1.829 m)   Wt 208 lb 6.4 oz (94.5 kg)   SpO2 98%   BMI 28.26 kg/m        Wt Readings from Last 3 Encounters:  10/17/22 208 lb 6.4 oz (94.5 kg)  06/30/22 197 lb (89.4 kg)  06/01/22 197 lb 9.6 oz (89.6 kg)     GEN:  Well nourished, well developed in no acute distress HEENT: Normal NECK: No JVD; No carotid bruits LYMPHATICS: No lymphadenopathy CARDIAC: RRR, no murmurs, rubs, gallops RESPIRATORY:  Clear to auscultation without rales, wheezing or rhonchi  ABDOMEN: Soft, non-tender, non-distended MUSCULOSKELETAL:  No edema; No deformity  SKIN: Warm and dry NEUROLOGIC:  Alert and oriented x 3 PSYCHIATRIC:  Normal affect   ASSESSMENT:    1. Coronary artery disease involving native heart without angina pectoris, unspecified vessel or lesion type   2. Hyperlipidemia, unspecified hyperlipidemia type   3. PAF (paroxysmal atrial fibrillation) ([place])   4. Essential hypertension   5. CKD (chronic kidney disease) stage 4, GFR 15-29 ml/min (HCC)     PLAN:    In order of problems listed above:  CAD: atypical chest pain. Myoview without ischemia and EF 53%. Continue medical therapy  Hypertension: Blood pressure elevated today but reports good readings at home. Just increased irbesartan this week.  Hyperlipidemia: On Lipitor. LDL 58 is at goal.   CKD stage IV: Followed by Dr. Santiago Bumpers of [facility]  PAF: On Eliquis and metoprolol             Medication Adjustments/Labs and Tests Ordered: Current medicines  are reviewed at length with the patient today.  Concerns regarding medicines are outlined above.  No orders of the defined types were placed in this encounter.  No orders of the defined types were placed in this encounter.   There are no Patient Instructions on file for this visit.   Signed, Khrista Braun [place], MD  10/17/2022 1:36 PM    [place]

## 2022-10-11 NOTE — Patient Outreach (Signed)
Aging Gracefully Program  RN Visit  10/11/2022  Tommy Dyer Jul 13, 1936 [id number]  Visit:  RN Visit Number: 3- Third Visit  RN TIME CALCULATION: Start TIme:  RN Start Time Calculation: 1130 End Time:  RN Stop Time Calculation: 8315 Total Minutes:  RN Time Calculation: 45  Readiness To Change Score:     Universal RN Interventions: Calendar Distribution: Yes Exercise Review: Yes Medications: Yes Medication Changes: Yes Mood: Yes Pain: Yes PCP Advocacy/Support: Yes Fall Prevention: Yes Incontinence: No Clinician View Of Client Situation: Home neat and clean.  Well dressed.  Appears frustrated that he got a call from MD office ([facility] ) to adjust his medications and he did not understand. Client View Of His/Her Situation: denies any rectal bleeding since taking a colace daily. reports that he is not having to push to have a BM anymore.  Reports concern about MD medication change. i called [facility] and spoke with [facility]. She reports that creatinine level was elevated and the Avapro is to be increaed to 300 mg per day. reviewed this with patient and showed him the correct bottle.  Healthcare Provider Communication: Did Higher education careers adviser With [employer] Provider?: Yes Method Of Communication: Telephone Healthcare Provider Response According to RN: Claified medication changes. According to Client, Did PCP Report Communication With An Aging Gracefully RN?: No  Clinician View of Client Situation: Public affairs consultant Of Client Situation: Home neat and clean.  Well dressed.  Appears frustrated that he got a call from MD office ([facility] ) to adjust his medications and he did not understand. Client's View of His/Her Situation: Client View Of His/Her Situation: denies any rectal bleeding since taking a colace daily. reports that he is not having to push to have a BM anymore.  Reports concern about MD medication change. i called [facility] and spoke with [facility]. She  reports that creatinine level was elevated and the Avapro is to be increaed to 300 mg per day. reviewed this with patient and showed him the correct bottle.  Medication Assessment:  reviewed changes.      OT Update: doing well. Pending home modifications. Contracts are signed.  Session Summary: doing well. Reviewed pending MD appointments.   Goals Addressed             This Visit's Progress    Aging Gracefully RN       Goal: Patient will report decrease in constipation in the next 120 days.  08/16/2022 Assessment:  Frequent problems with constipation. Reports bowel movements every 3 days; occasionally longer.  Interventions: reviewed reason for constipation.  Encouraged patient to take a stool softner ( colace '100mg'$  ) once a day. Reviewed importance of hydration and encouraged patient to drink atleast 4 bottles of water per day.  Reviewed importance of activity. Encouraged patient to keep a log of when he has a bowel movement. Brainstorming with patient,   Plan: next home visit planned for 09/13/2022  [name] RN, BSN, CEN RN Case Manager for [place] Mobile: [phone]    09/13/2022  Assessment:  Patient states that he has been having significant constipation.  Reports saw MD today.  Patient reports recent bleeding.  Patient states a small amount.Marland Kitchen He thinks is from his hemorrhoids.  Interventions:  reviewed with patient the importance of increasing his water. Taking a stool softner and increasing his fiber.  Plan: next home visit planned for 10/11/2022  [name] RN, BSN, CEN RN Case Manager for [employer]  Mobile: [phone]   10/11/2022  Assessment:  Super proud of patient. He is taking colace daily.denies recent constipation. Denies any additional rectal bleeding.  Reports he does not have to push hard any more to have a bowel movement.  Interventions: encouraged patient to continue to take  his colace daily or twice day. Encouraged patient to continue to be active. Reviewed importance of home exercise program.  Encouraged patient to continue to eat and drink well.  Placed call to [facility] to help patient understand his medication change.   Plan: follow up planned for 11/15/2022.  Encouraged patient to call me if needed sooner.    [name] RN, BSN, Careers information officer for [employer] Mobile: [phone]       [name] RN, BSN, Careers information officer for [employer] Mobile: [phone]

## 2022-10-17 ENCOUNTER — Encounter: Payer: Self-pay | Admitting: Cardiology

## 2022-10-17 ENCOUNTER — Ambulatory Visit: Payer: Medicare HMO | Attending: Cardiology | Admitting: Cardiology

## 2022-10-17 VITALS — BP 153/81 | HR 82 | Ht 72.0 in | Wt 208.4 lb

## 2022-10-17 DIAGNOSIS — I2510 Atherosclerotic heart disease of native coronary artery without angina pectoris: Secondary | ICD-10-CM | POA: Diagnosis not present

## 2022-10-17 DIAGNOSIS — I480 Paroxysmal atrial fibrillation: Secondary | ICD-10-CM | POA: Diagnosis not present

## 2022-10-17 DIAGNOSIS — I10 Essential (primary) hypertension: Secondary | ICD-10-CM

## 2022-10-17 DIAGNOSIS — E785 Hyperlipidemia, unspecified: Secondary | ICD-10-CM | POA: Diagnosis not present

## 2022-10-17 DIAGNOSIS — N184 Chronic kidney disease, stage 4 (severe): Secondary | ICD-10-CM

## 2022-10-17 NOTE — Patient Instructions (Signed)
Medication Instructions:  Your physician recommends that you continue on your current medications as directed. Please refer to the Current Medication list given to you today.  *If you need a refill on your cardiac medications before your next appointment, please call your pharmacy*   Lab Work: None   Testing/Procedures: None   Follow-Up: At [facility], you and your health needs are our priority.  As part of our continuing mission to provide you with exceptional heart care, we have created designated Provider Care Teams.  These Care Teams include your primary Cardiologist (physician) and Advanced Practice Providers (APPs -  Physician Assistants and Nurse Practitioners) who all work together to provide you with the care you need, when you need it.  We recommend signing up for the patient portal called "MyChart".  Sign up information is provided on this After Visit Summary.  MyChart is used to connect with patients for Virtual Visits (Telemedicine).  Patients are able to view lab/test results, encounter notes, upcoming appointments, etc.  Non-urgent messages can be sent to your provider as well.   To learn more about what you can do with MyChart, go to [email or web address].    Your next appointment:   6 month(s)  Provider:   Peter Martinique, MD     Other Instructions

## 2022-10-24 DIAGNOSIS — N1832 Chronic kidney disease, stage 3b: Secondary | ICD-10-CM | POA: Diagnosis not present

## 2022-11-08 ENCOUNTER — Other Ambulatory Visit: Payer: Medicare HMO

## 2022-11-08 NOTE — Patient Instructions (Signed)
Visit Information  Thank you for taking time to visit with me today. Please don't hesitate to contact me if I can be of assistance to you before our next scheduled telephone appointment.  Following are the goals we discussed today:   Goals Addressed             This Visit's Progress    COMPLETED: Aging Gracefully RN       Goal: Patient will report decrease in constipation in the next 120 days.  08/16/2022 Assessment:  Frequent problems with constipation. Reports bowel movements every 3 days; occasionally longer.  Interventions: reviewed reason for constipation.  Encouraged patient to take a stool softner ( colace 176m ) once a day. Reviewed importance of hydration and encouraged patient to drink atleast 4 bottles of water per day.  Reviewed importance of activity. Encouraged patient to keep a log of when he has a bowel movement. Brainstorming with patient,   Plan: next home visit planned for 09/13/2022  ATomasa RandRN, BSN, CEN RN Case Manager for [facility] [place]: [phone]   09/13/2022  Assessment:  Patient states that he has been having significant constipation.  Reports saw MD today.  Patient reports recent bleeding.  Patient states a small amount..Marland KitchenHe thinks is from his hemorrhoids.  Interventions:  reviewed with patient the importance of increasing his water. Taking a stool softner and increasing his fiber.  Plan: next home visit planned for 10/11/2022  ATomasa RandRN, BSN, CEN RN Case Manager for [place]: [phone]  10/11/2022  Assessment:  Super proud of patient. He is taking colace daily.denies recent constipation. Denies any additional rectal bleeding.  Reports he does not have to push hard any more to have a bowel movement.  Interventions: encouraged patient to continue to take his colace daily or twice day. Encouraged patient to continue to be active. Reviewed importance of home  exercise program.  Encouraged patient to continue to eat and drink well.  [facility] help [facility] understand his medication change.   Plan: follow up planned for 11/15/2022.  Encouraged patient to call me if needed sooner.    ATomasa RandRN, BSN, CEN RN Case MFreight forwarderfor [facility] Mobile: [phone]  11/08/2022  Assessment: Denies any constipation. Taking colace . Reports feels great.   Interventions: encouraged patient to continue to take his colace, drink water and exercise.   PLAN: goal met.  ATomasa RandRN, BSN, CEN RN Case MFreight forwarderfor [place]: [phone]          If you are experiencing a Mental Health or B[place]or need someone to talk to, please call the Suicide and Crisis Lifeline: 988 call the [place]National Suicide Prevention Lifeline: 16193104666or TTY: 1234-601-4781TTY (316-239-6360 to talk to a trained counselor call 1-800-273-TALK (toll free, 24 hour hotline) go to [facility] [address] [phone] call 911   The patient verbalized understanding of instructions, educational materials, and care plan provided today and agreed to receive a mailed copy of patient instructions, educational materials, and care plan.   ATomasa RandRN, BSN, CCareers information officerfor APer[facility] GroupMobile: [phone]

## 2022-11-08 NOTE — Patient Outreach (Signed)
Aging Gracefully Program  RN Visit  11/08/2022  Kingdon Lenzini 05/03/1936 [id number]  Visit:  RN Visit Number: 4- Fourth Visit  RN TIME CALCULATION: Start TIme:  RN Start Time Calculation: [id number] End Time:  RN Stop Time Calculation: 1300 Total Minutes:  RN Time Calculation: 30  Readiness To Change Score:     Universal RN Interventions: Calendar Distribution: Yes Exercise Review: Yes Medications: Yes Medication Changes: Yes Mood: Yes Pain: Yes PCP Advocacy/Support: No Fall Prevention: Yes Incontinence: No Clinician View Of Client Situation: Home neat and clean. Patient ambulating without difficulty, Well dressed Client View Of His/Her Situation: Patient reports that he is no longer constipated. Reports taking a colace daily. No bleeding.  Reports that he is feeling good. Denies any recent falls or changes in condition.  [facility].  Denies any pain today.  Healthcare Provider Communication: Did Higher education careers adviser With [employer] Provider?: No According to Client, Did PCP Report Communication With An Aging Gracefully RN?: No  Clinician View of Client Situation: Clinician View Of Client Situation: Home neat and clean. Patient ambulating without difficulty, Well dressed Client's View of His/Her Situation: Client View Of His/Her Situation: Patient reports that he is no longer constipated. Reports taking a colace daily. No bleeding.  Reports that he is feeling good. Denies any recent falls or changes in condition.  Reports [facility] and [facility].  Denies any pain today.  Medication Assessment: denies any changes to medications    OT Update: home modifications completed. Nursing visits completed.   Session Summary: doing great   Goals Addressed             This Visit's Progress    COMPLETED: Aging Gracefully RN       Goal: Patient will report decrease in constipation in the  next 120 days.  08/16/2022 Assessment:  Frequent problems with constipation. Reports bowel movements every 3 days; occasionally longer.  Interventions: reviewed reason for constipation.  Encouraged patient to take a stool softner ( colace 174m ) once a day. Reviewed importance of hydration and encouraged patient to drink atleast 4 bottles of water per day.  Reviewed importance of activity. Encouraged patient to keep a log of when he has a bowel movement. Brainstorming with patient,   Plan: next home visit planned for 09/13/2022  ATomasa RandRN, BSN, CEN RN Case Manager for [facility]: [phone]   09/13/2022  Assessment:  Patient states that he has been having significant constipation.  Reports saw MD today.  Patient reports recent bleeding.  Patient states a small amount..Marland KitchenHe thinks is from his hemorrhoids.  Interventions:  reviewed with patient the importance of increasing his water. Taking a stool softner and increasing his fiber.  Plan: next home visit planned for 10/11/2022  ATomasa RandRN, BSN, CEN RN Case Manager for [place]Mobile: [phone]  10/11/2022  Assessment:  Super proud of patient. He is taking colace daily.denies recent constipation. Denies any additional rectal bleeding.  Reports he does not have to push hard any more to have a bowel movement.  Interventions: encouraged patient to continue to take his colace daily or twice day. Encouraged patient to continue to be active. Reviewed importance of home exercise program.  Encouraged patient to continue to eat and drink well.  [facility] help patient understand  his medication change.   Plan: follow up planned for 11/15/2022.  Encouraged patient to call me if needed sooner.    [name] RN, BSN, CEN RN Case Freight forwarder for [employer] Mobile: [phone]   11/08/2022  Assessment:  Denies any constipation. Taking colace . Reports feels great.   Interventions: encouraged patient to continue to take his colace, drink water and exercise.   PLAN: goal met.  [name] RN, BSN, Careers information officer for [employer] Mobile: [phone]        [name] RN, BSN, Careers information officer for [employer] Mobile: [phone]

## 2022-11-10 DIAGNOSIS — Z8546 Personal history of malignant neoplasm of prostate: Secondary | ICD-10-CM | POA: Diagnosis not present

## 2022-11-10 DIAGNOSIS — N1832 Chronic kidney disease, stage 3b: Secondary | ICD-10-CM | POA: Diagnosis not present

## 2022-11-10 DIAGNOSIS — C61 Malignant neoplasm of prostate: Secondary | ICD-10-CM | POA: Diagnosis not present

## 2022-11-10 DIAGNOSIS — I480 Paroxysmal atrial fibrillation: Secondary | ICD-10-CM | POA: Diagnosis not present

## 2022-11-10 DIAGNOSIS — E7800 Pure hypercholesterolemia, unspecified: Secondary | ICD-10-CM | POA: Diagnosis not present

## 2022-11-10 DIAGNOSIS — I10 Essential (primary) hypertension: Secondary | ICD-10-CM | POA: Diagnosis not present

## 2022-11-10 DIAGNOSIS — D508 Other iron deficiency anemias: Secondary | ICD-10-CM | POA: Diagnosis not present

## 2022-11-25 ENCOUNTER — Emergency Department (HOSPITAL_COMMUNITY): Payer: Medicare HMO

## 2022-11-25 ENCOUNTER — Other Ambulatory Visit: Payer: Self-pay

## 2022-11-25 ENCOUNTER — Emergency Department (HOSPITAL_COMMUNITY)
Admission: EM | Admit: 2022-11-25 | Discharge: 2022-11-25 | Disposition: A | Discharge status: Home / Self Care | Payer: Medicare HMO | Attending: Emergency Medicine | Admitting: Emergency Medicine

## 2022-11-25 ENCOUNTER — Encounter (HOSPITAL_COMMUNITY): Payer: Self-pay

## 2022-11-25 DIAGNOSIS — R079 Chest pain, unspecified: Secondary | ICD-10-CM | POA: Diagnosis not present

## 2022-11-25 DIAGNOSIS — Z8546 Personal history of malignant neoplasm of prostate: Secondary | ICD-10-CM | POA: Insufficient documentation

## 2022-11-25 DIAGNOSIS — R0789 Other chest pain: Secondary | ICD-10-CM | POA: Diagnosis not present

## 2022-11-25 DIAGNOSIS — I2510 Atherosclerotic heart disease of native coronary artery without angina pectoris: Secondary | ICD-10-CM | POA: Diagnosis not present

## 2022-11-25 DIAGNOSIS — I10 Essential (primary) hypertension: Secondary | ICD-10-CM | POA: Insufficient documentation

## 2022-11-25 DIAGNOSIS — Z79899 Other long term (current) drug therapy: Secondary | ICD-10-CM | POA: Diagnosis not present

## 2022-11-25 LAB — BASIC METABOLIC PANEL
Anion gap: 7 (ref 5–15)
BUN: 32 mg/dL — ABNORMAL HIGH (ref 8–23)
CO2: 21 mmol/L — ABNORMAL LOW (ref 22–32)
Calcium: 9.3 mg/dL (ref 8.9–10.3)
Chloride: 106 mmol/L (ref 98–111)
Creatinine, Ser: 2.34 mg/dL — ABNORMAL HIGH (ref 0.61–1.24)
GFR, Estimated: 27 mL/min — ABNORMAL LOW (ref >60)
Glucose, Bld: 96 mg/dL (ref 70–99)
Potassium: 3.9 mmol/L (ref 3.5–5.1)
Sodium: 134 mmol/L — ABNORMAL LOW (ref 135–145)

## 2022-11-25 LAB — CBC
HCT: 36.2 % — ABNORMAL LOW (ref 39.0–52.0)
Hemoglobin: 12.7 g/dL — ABNORMAL LOW (ref 13.0–17.0)
MCH: 27 pg (ref 26.0–34.0)
MCHC: 35.1 g/dL (ref 30.0–36.0)
MCV: 77 fL — ABNORMAL LOW (ref 80.0–100.0)
Platelets: 157 10*3/uL (ref 150–400)
RBC: 4.7 MIL/uL (ref 4.22–5.81)
RDW: 16.5 % — ABNORMAL HIGH (ref 11.5–15.5)
WBC: 5.9 10*3/uL (ref 4.0–10.5)
nRBC: 0 % (ref 0.0–0.2)

## 2022-11-25 LAB — TROPONIN I (HIGH SENSITIVITY)
Troponin I (High Sensitivity): 9 ng/L (ref <18)
Troponin I (High Sensitivity): 10 ng/L (ref <18)

## 2022-11-25 NOTE — ED Triage Notes (Addendum)
Pt c/o CP that started yesterday, intermittent, worsening this am, radiation to R shoulder; denies SOB, denies N/V; pain feels like tightness, stabbing; pt took 1 nitro PTA, states pain improved; Hx MI and stent placement 3 years ago, states this feels different; states pain currently limited to R arm/shoulder, 1/10

## 2022-11-25 NOTE — ED Provider Notes (Signed)
[facility] Provider Note   CSN: [id number] Arrival date & time: 11/25/22  1452     History  Chief Complaint  Patient presents with   Chest Pain    Tommy Dyer is a 87 y.o. male.   Chest Pain Patient resents with chest pain.  Has had episodes of sharp pain in left arm.  Comes and goes.  Lasts a couple seconds.  Today had right shoulder pain.  Lasted a little longer.  Last about an hour.  Started at rest.  Has not had exertional type pain.  Did took a nitroglycerin improved somewhat.  Feels unlike previous MI.  Rare cough.  No swelling his legs.    Past Medical History:  Diagnosis Date   Arthritis    CAD (coronary artery disease)    a. 02/2020: STEMI with DES to 100% 2nd Mrg stenosis. Residual 80% mid-LAD stenosis with consideration of staged PCI recommended.     Gastric ulcer    GERD (gastroesophageal reflux disease)    History of kidney stones    Hypercholesterolemia    Hypertension    Kidney stones    Osteoporosis    Prostate cancer ([place])    STEMI (ST elevation myocardial infarction) ([place])     Home Medications Prior to Admission medications   Medication Sig Start Date End Date Taking? Authorizing Provider  acetaminophen (TYLENOL) 500 MG tablet Take 1,000 mg by mouth every 6 (six) hours as needed for headache or moderate pain.    [provider]  apixaban (ELIQUIS) 2.5 MG TABS tablet Take 1 tablet (2.5 mg total) by mouth 2 (two) times daily. 09/05/22   Martinique, Peter M, MD  atorvastatin (LIPITOR) 80 MG tablet TAKE 1 TABLET EVERY DAY 08/30/22   Martinique, Peter M, MD  Carboxymethylcellulose Sodium (REFRESH TEARS OP) Place 1 drop into both eyes daily as needed (dry eyes).     [provider]  Famotidine (PEPCID AC PO) Take 1 tablet by mouth daily as needed (heartburn).    [provider]  fluticasone (FLONASE) 50 MCG/ACT nasal spray Place 1 spray into both nostrils daily as needed for allergies or  rhinitis.     [provider]  irbesartan (AVAPRO) 300 MG tablet Take 300 mg by mouth daily. 10/11/22   [provider]  Lidocaine-Glycerin (PREPARATION H EX) Place 1 application rectally daily as needed (hemorrhoids/pain/itching).     [provider]  meclizine (ANTIVERT) 25 MG tablet Take 25 mg by mouth daily as needed for dizziness. 03/25/20   [provider]  metoprolol succinate (TOPROL-XL) 50 MG 24 hr tablet Take 1 tablet (50 mg total) by mouth daily. Take with or immediately following a meal. 03/16/20   Strader, [place] M, PA-C  nitroGLYCERIN (NITROSTAT) 0.4 MG SL tablet Place 1 tablet (0.4 mg total) under the tongue every 5 (five) minutes x 3 doses as needed for chest pain. 04/06/22   Almyra Deforest, PA  tamsulosin (FLOMAX) 0.4 MG CAPS capsule Take 0.4 mg by mouth every other day.    [provider]      Allergies    Patient has no known allergies.    Review of Systems   Review of Systems  Cardiovascular:  Positive for chest pain.    Physical Exam Updated Vital Signs BP 123/77   Pulse 64   Temp (!) 97.5 F (36.4 C)   Resp 13   Ht 6' (1.829 m)   Wt 90.7 kg   SpO2  99%   BMI 27.12 kg/m  Physical Exam Vitals and nursing note reviewed.  Cardiovascular:     Rate and Rhythm: Regular rhythm.  Chest:     Chest wall: No tenderness.  Abdominal:     Tenderness: There is no abdominal tenderness.  Musculoskeletal:     Right lower leg: No edema.     Left lower leg: No edema.  Skin:    General: Skin is warm.  Neurological:     Mental Status: He is alert.     ED Results / Procedures / Treatments   Labs (all labs ordered are listed, but only abnormal results are displayed) Labs Reviewed  BASIC METABOLIC PANEL - Abnormal; Notable for the following components:      Result Value   Sodium 134 (*)    CO2 21 (*)    BUN 32 (*)    Creatinine, Ser 2.34 (*)    GFR, Estimated 27 (*)    All other components within normal limits  CBC -  Abnormal; Notable for the following components:   Hemoglobin 12.7 (*)    HCT 36.2 (*)    MCV 77.0 (*)    RDW 16.5 (*)    All other components within normal limits  TROPONIN I (HIGH SENSITIVITY)  TROPONIN I (HIGH SENSITIVITY)    EKG EKG Interpretation  Date/Time:  Friday November 25 2022 15:07:08 EST Ventricular Rate:  69 PR Interval:  209 QRS Duration: 93 QT Interval:  383 QTC Calculation: 411 R Axis:   25 Text Interpretation: Sinus rhythm Low voltage, precordial leads Abnormal R-wave progression, early transition Baseline wander in lead(s) V6 Confirmed by Davonna Belling [phone]) on 11/25/2022 3:23:54 PM  Radiology DG Chest 2 View  Result Date: 11/25/2022 CLINICAL DATA:  Chest pain EXAM: CHEST - 2 VIEW COMPARISON:  CXR 06/21/22 FINDINGS: No pleural effusion. No pneumothorax. Unchanged cardiac and mediastinal contours. No focal airspace opacity. No radiographically apparent displaced rib fractures. Visualized upper abdomen is unremarkable. Vertebral body heights are maintained. IMPRESSION: No radiographic finding to explain chest pain. Electronically Signed   By: Marin Roberts M.D.   On: 11/25/2022 15:59    Procedures Procedures    Medications Ordered in ED Medications - No data to display  ED Course/ Medical Decision Making/ A&P                             Medical Decision Making Amount and/or Complexity of Data Reviewed Labs: ordered. Radiology: ordered.   Patient with chest pain.  Right chest to right shoulder.  Not exertional.  Did improve somewhat after nitroglycerin but he thinks it may have just been due to time.  Has had no exertional pain.  Does have slight left arm pain that is coming up but last only a couple seconds.  Previous stent.  Differential diagnosis does include coronary artery disease but off the history felt somewhat less likely.  Pain began around an hour prior to arrival.  Will get chest x-ray, EKG and blood work.  EKG reassuring.  Troponins negative x  2.  Doubt cardiac ischemia as a cause.  Follow-up with PCP or cardiology.  Appears stable for discharge home.  No pneumonia seen.        Final Clinical Impression(s) / ED Diagnoses Final diagnoses:  Nonspecific chest pain    Rx / DC Orders ED Discharge Orders     None         Davonna Belling, MD  11/25/22 1825  

## 2022-11-29 ENCOUNTER — Other Ambulatory Visit: Payer: Self-pay | Admitting: Occupational Therapy

## 2022-11-29 DIAGNOSIS — I10 Essential (primary) hypertension: Secondary | ICD-10-CM | POA: Diagnosis not present

## 2022-11-29 DIAGNOSIS — K219 Gastro-esophageal reflux disease without esophagitis: Secondary | ICD-10-CM | POA: Diagnosis not present

## 2022-11-29 DIAGNOSIS — E7800 Pure hypercholesterolemia, unspecified: Secondary | ICD-10-CM | POA: Diagnosis not present

## 2022-11-29 DIAGNOSIS — N1832 Chronic kidney disease, stage 3b: Secondary | ICD-10-CM | POA: Diagnosis not present

## 2022-11-29 DIAGNOSIS — I480 Paroxysmal atrial fibrillation: Secondary | ICD-10-CM | POA: Diagnosis not present

## 2022-11-29 NOTE — Patient Outreach (Signed)
Aging Gracefully Program  OT Follow-Up Visit  11/29/2022  Crawford Guadagnino 1936-07-19 [id number]  Visit:  3- Third Visit  Start Time:  [id number] End Time:  1150 Total Minutes:  25  Readiness to Change Score :  Readiness to Change Score: 10   Goals:   Goals Addressed             This Visit's Progress    COMPLETED: Patient Stated       Safety in shower: Hand held shower, grab bar on outside of shower to help step in and out. MET: grab bar and handheld shower.  Name: Duwane Melchert      Date:  11/29/2022   OT ACTION PLAN: Bathing  Target Problem Area:   Safety in the bathroom   Why Problem May Occur:    Decreased balance  Painful joints Target Goal(s):   Safety in bathroom     STRATEGIES   Saving Your Energy DO: ? Use a tub bench/seat  ? Keep frequently used items within easy reach    Modifying your home environment and making it safe     DO: ? Install grab bars in the shower and next to the toilet ? Place a rubber mat the entire length of the tub--did shower treads as alternative option ? Make sure the bathroom is well-lit   Simplifying the way you set up tasks or daily routines DO: ? Plan to bathe/shower before you're overly tired ? Gather everything you will need before beginning      PRACTICE   Based on what we have talked about, you are willing to try:   ? Use shower seat as needed when knee(s) are bothering you or your balance is off.    ? Continue to use the handles on toilet to help you get up and down from the toilet.     If an idea does not work the first time, try it again (and again).     We may make some changes over the next few sessions, based on how they work.                   Cathy Leonard_________     11/29/2022               Occupational Therapist                                            Date       COMPLETED: Patient Stated       Safety at toilet: grab bar on left hand side of toilet as you sit on it. MET: grab bar installed next  to toilet  Name: Nael Eisler      Date:  11/29/2022   OT ACTION PLAN: Bathing  Target Problem Area:   Safety in the bathroom   Why Problem May Occur:    Decreased balance  Painful joints Target Goal(s):   Safety in bathroom     STRATEGIES   Saving Your Energy DO: ? Use a tub bench/seat  ? Keep frequently used items within easy reach    Modifying your home environment and making it safe     DO: ? Install grab bars in the shower and next to the toilet ? Place a rubber mat the entire length of the tub--did shower treads as alternative option ? Make sure the bathroom  is well-lit   Simplifying the way you set up tasks or daily routines DO: ? Plan to bathe/shower before you're overly tired ? Gather everything you will need before beginning      PRACTICE   Based on what we have talked about, you are willing to try:   ? Use shower seat as needed when knee(s) are bothering you or your balance is off.    ? Continue to use the handles on toilet to help you get up and down from the toilet.     If an idea does not work the first time, try it again (and again).     We may make some changes over the next few sessions, based on how they work.                   Cathy Leonard_________     11/29/2022               Occupational Therapist                                            Date         Post Clinical Reasoning: Client Action (Goal) Two Interventions: Grab bar installed at shower as well as a handheld shower head Did Client Try?: No (client reports he has been using the grab bar and hand held shower with both working well) Did Client Try?: No Client Action (Goal) Three Interventions: Grab bar installed on left side of toilet Did Client Try?: No Reason Client Did Not Try?:  (Client reports he has been using the grab bar as needed and no issues.) Clinician View Of Client Situation:: Mr. Arnzen is doing relatively well. He reports he recently had right upper chest pains  which lead him to go to the emergency room. Nothing was found with the testing. He is to follow up with his primary MD and cardiologist. Client View Of His/Her Situation:: He feels he is doing well overall, but is a little concerned at the upper right chest/shoulder pain he has been having. He is happy with the home modifications except would like to have a full piece of screen to replace the one off the bedroom deck that has been patched together. Next Visit Plan:: Educational book  [place], [place] Gracefully [phone]

## 2022-11-30 DIAGNOSIS — M25511 Pain in right shoulder: Secondary | ICD-10-CM | POA: Diagnosis not present

## 2022-11-30 DIAGNOSIS — R079 Chest pain, unspecified: Secondary | ICD-10-CM | POA: Diagnosis not present

## 2022-12-01 ENCOUNTER — Telehealth: Payer: Self-pay | Admitting: Cardiology

## 2022-12-01 NOTE — Telephone Encounter (Signed)
Pt c/o medication issue:  1. Name of Medication:  apixaban (ELIQUIS) 2.5 MG TABS tablet  2. How are you currently taking this medication (dosage and times per day)?   3. Are you having a reaction (difficulty breathing--STAT)?   4. What is your medication issue?   Patient states [facility] notified him that they are unable to approve application and he is ineligible due to documentation on household income being missing. Patient is requesting assistance with completing the missing information.

## 2022-12-01 NOTE — Telephone Encounter (Signed)
Called pt to let him know there is a list on the application of the required paperwork he needs. He verbalized understanding and will bring his application to his appointment next week.

## 2022-12-05 NOTE — Progress Notes (Addendum)
Cardiology Clinic Note   Date: 12/06/2022 ID: Mak Sneeringer, DOB June 19, 1936, MRN [id number]  Primary Cardiologist:  Peter Swaziland, MD  Patient Profile    Tommy Dyer is a 87 y.o. male who presents to the clinic today for follow-up after ED visit for nonspecific chest pain.  Past medical history significant for: CAD. LHC 03/13/2020 (STEMI): Proximal LAD 40%.  Mid LAD 80%.  OM2 100%.  PCI with DES 2.75 x 22 OM2.  Elevated LVEDP 23 mmHg.  Recommend DAPT x 1 year.  Depending on recovery of renal function consider staged PCI of the mid LAD. Echo 03/14/2020: EF 55 to 60%.  Grade I DD.  Elevated LA pressure.  Mild to moderate aortic valve sclerosis/calcification without stenosis. Nuclear stress test 06/30/2022: LV perfusion is abnormal.  No evidence of ischemia.  There is a medium defect with severe reduction in uptake present in the mid to basal inferior lateral location that is fixed.  There is normal wall motion in the defect area consistent with infarction.  Findings consistent with prior MI in the inferolateral distribution.  Overall low risk study. PAF. Onset 04/06/2020.  On anticoagulation. Hypertension. Hyperlipidemia. Lipid panel 11/10/2022: LDL 54, HDL 26, TG 174, total 109.   History of Present Illness    Tommy Dyer was first evaluated by Dr. Swaziland on 03/13/2020 during hospitalization for acute STEMI.  Patient presented to the ER via EMS from a doctor's office where he was waiting for a family member getting a procedure.  He reported central chest pain described as pressure beginning the day prior and worsening while in the waiting room.  Pain radiated to left arm/neck.  Patient was taken emergently to the Cath Lab and underwent PCI with DES to OM2 (further details above).  Patient was last seen in the office by Dr. Swaziland on 10/17/2022 for follow-up.  He recently had irbesartan increased by PCP.  BP was elevated at office visit but reported normal at home.  Patient had no complaints and no  medication changes were made.  Most recently, patient presented to the ER on 11/25/2022 with complaints of chest pain radiating to right shoulder without shortness of breath, nausea or vomiting.  Pain described as tightness, stabbing which improved with NTG.  Patient reported pain is different than anginal pain.  Troponin x 2 negative.  EKG without ischemic changes.  Patient was discharged to follow-up with cardiology at as an outpatient.  Today, patient is accompanied by his wife.  He reports he has not had continued episodes of chest pain but has had pain in his right shoulder since his ED visit.  Patient reports he is not sure that his pain actually improved with nitroglycerin on the day he went to the emergency room as he took some Tylenol and felt that his pain was already improving before he took the nitroglycerin.  Upon further questioning of the patient his pain is not necessarily in his shoulder but more his right upper chest.  Movement and palpation do not bring on pain.  Pain is described as sore and achy lasting less than a minute.  He has not tried taking nitroglycerin for this pain.  He has taken Tylenol but is uncertain if it is actually helping.  [facility] [place] [facility] [place] [facility] [place] and doing lawn work.  He also will take laps around [place] while his wife shops.  He is able to do all of these activities without any pain in his chest.  His wife reports he ate pizza a couple of days prior to the episode of chest pain that brought him to the ED.  He is not sure if there is a correlation to this and does not feel his pain was similar to indigestion.  Patient denies shortness of breath, DOE, lower extremity edema, orthopnea, or PND.  Patient reports he has a Chiropractor" and is very concerned about his symptoms.   ROS: All other systems reviewed and are otherwise negative except as noted in History of Present Illness.  Studies Reviewed    ECG not ordered today.  Risk  Assessment/Calculations     CHA2DS2-VASc Score = 4   This indicates a 4.8% annual risk of stroke. The patient's score is based upon: CHF History: 0 HTN History: 1 Diabetes History: 0 Stroke History: 0 Vascular Disease History: 1 Age Score: 2 Gender Score: 0             Physical Exam    VS:  BP 138/72   Pulse 83   Ht 6' (1.829 m)   Wt 206 lb (93.4 kg)   SpO2 97%   BMI 27.94 kg/m  , BMI Body mass index is 27.94 kg/m.  GEN: Well nourished, well developed, in no acute distress. Neck: No JVD or carotid bruits. Cardiac: RRR. No murmurs. No rubs or gallops.   Respiratory:  Respirations regular and unlabored. Clear to auscultation without rales, wheezing or rhonchi. GI: Soft, nontender, nondistended. Extremities: Radials/DP/PT 2+ and equal bilaterally. No clubbing or cyanosis. No edema.  Skin: Warm and dry, no rash. Neuro: Strength intact.  Assessment & Plan   CAD.  S/p PCI with DES to OM2 August 2021.  Mid LAD 80% with plan for potential staged PCI at a later date that has been treated medically since.  Nuclear stress test October 2023 low risk study without evidence of ischemia.  Patient presented to ED 11/25/2022 with complaints of chest pain.  Troponin x 2 negative, EKG without ischemic changes.  Patient denies any further central chest pain but has had upper right chest pain that is very brief lasting no more than a minute and not associated with shortness of breath or activity.  Will start isosorbide 15 mg daily.  Continue atorvastatin, metoprolol, Eliquis, as needed SL NTG. PAF.  Onset July 2021.  Patient denies palpitations.  He has regular rate and rhythm on auscultation today.  Continue metoprolol and Eliquis. Hypertension.  BP today 138/72 on intake and 140/70 at recheck.  Patient denies headaches or vision changes.  He has a history of dizziness secondary to vertigo which responds well to meclizine.  He denies dizziness related to higher readings of BP.  BP at home  typically runs in the 130s.  Times get down to the 120s.  Continue irbesartan and metoprolol. Hyperlipidemia.  LDL February 2024 54, at goal.  Continue atorvastatin.  Disposition: Isosorbide 15 mg daily.  Return in 3 months or sooner as needed         Signed, Etta Grandchild. Smitty Ackerley, DNP, NP-C

## 2022-12-06 ENCOUNTER — Ambulatory Visit: Payer: Medicare HMO | Attending: Student | Admitting: Student

## 2022-12-06 ENCOUNTER — Encounter: Payer: Self-pay | Admitting: Student

## 2022-12-06 VITALS — BP 138/72 | HR 83 | Ht 72.0 in | Wt 206.0 lb

## 2022-12-06 DIAGNOSIS — I480 Paroxysmal atrial fibrillation: Secondary | ICD-10-CM | POA: Diagnosis not present

## 2022-12-06 DIAGNOSIS — E785 Hyperlipidemia, unspecified: Secondary | ICD-10-CM | POA: Diagnosis not present

## 2022-12-06 DIAGNOSIS — I10 Essential (primary) hypertension: Secondary | ICD-10-CM | POA: Diagnosis not present

## 2022-12-06 DIAGNOSIS — I25118 Atherosclerotic heart disease of native coronary artery with other forms of angina pectoris: Secondary | ICD-10-CM

## 2022-12-06 MED ORDER — ISOSORBIDE MONONITRATE ER 30 MG PO TB24: 15.0000 mg | ORAL_TABLET | Freq: Every day | ORAL | 3 refills | Status: AC

## 2022-12-06 NOTE — Patient Instructions (Signed)
Medication Instructions:  Your physician has recommended you make the following change in your medication:  START: Imdur '15mg'$  daily  *If you need a refill on your cardiac medications before your next appointment, please call your pharmacy*   Lab Work: NONE If you have labs (blood work) drawn today and your tests are completely normal, you will receive your results only by: [place] (if you have MyChart) OR A paper copy in the mail If you have any lab test that is abnormal or we need to change your treatment, we will call you to review the results.   Testing/Procedures: NONE   Follow-Up: At [facility], you and your health needs are our priority.  As part of our continuing mission to provide you with exceptional heart care, we have created designated Provider Care Teams.  These Care Teams include your primary Cardiologist (physician) and Advanced Practice Providers (APPs -  Physician Assistants and Nurse Practitioners) who all work together to provide you with the care you need, when you need it.  We recommend signing up for the patient portal called "MyChart".  Sign up information is provided on this After Visit Summary.  MyChart is used to connect with patients for Virtual Visits (Telemedicine).  Patients are able to view lab/test results, encounter notes, upcoming appointments, etc.  Non-urgent messages can be sent to your provider as well.   To learn more about what you can do with MyChart, go to [email or web address].    Your next appointment:   3 month(s)  Provider:   Peter Martinique, MD Or Sonia Side, NP

## 2022-12-07 ENCOUNTER — Telehealth: Payer: Self-pay

## 2022-12-07 NOTE — Telephone Encounter (Signed)
Called patient and informed him that I received his financial paper work along with the letter form the patient assistance foundation for his Eliquis that he left in the office to my attention on 12/06/22. I also informed the patient that I will fax the copies that I made to the foundation and that he can pick up his originals. He thanked me for calling and stated that he will come about 12:30 PM and 1 PM tomorrow 12/08/22.

## 2022-12-13 ENCOUNTER — Telehealth: Payer: Self-pay | Admitting: Student

## 2022-12-13 NOTE — Telephone Encounter (Signed)
Patient states starting the Imdur after last visit.  Taking 1/2 tablet (15 mg).  He states he took it for 2 days and stopped due to side effects.  He states he had bad headaches ane diarrhea and felt like his stomach was "boiling".  He stopped the medication on the 15th.  The diarrhea continued and the "boiling " feeling so he took Pepcid the next day and symptoms resolved.  He states he has had one episodes of chest or arm pain since last OV, but it only lasted a few seconds.  He has had no further episodes,  nor has he needed top take any nitroglycerin. He ask can he not go back on this medication.  He prefers not to take it.  Please advise.

## 2022-12-13 NOTE — Telephone Encounter (Signed)
Pt c/o medication issue:  1. Name of Medication: Isosorbide Mononitrate  2. How are you currently taking this medication (dosage and times per day)?   3. Are you having a reaction (difficulty breathing--STAT)?   4. What is your medication issue? Bad headaches, diarrhea  and nervous- he have stopped taking this medicine

## 2022-12-14 NOTE — Telephone Encounter (Signed)
Yes he can stop it. Let us know if he has more frequent chest pain   Peter Martinique MD, [facility]   Patient will discontinue Imdur per Dr.Jordan. He verbalized understanding.

## 2022-12-15 ENCOUNTER — Telehealth: Payer: Self-pay | Admitting: Cardiology

## 2022-12-15 NOTE — Telephone Encounter (Signed)
Returned called to patient and informed him that I have not seen anything about his approval from the foundation for Eliquis and that I also spoke with Dr. Doug Sou nurse to see anything may have come in and she stated that she as not heard anything. I gave patient the number to the foundation. He thanked me for calling him back and being helpful.

## 2022-12-15 NOTE — Telephone Encounter (Signed)
Pt asked to speak to Ellison Carwin, CMA about Eliquis. Pt states he is calling for an update on patient assistance forms.

## 2022-12-22 ENCOUNTER — Other Ambulatory Visit: Payer: Self-pay | Admitting: Occupational Therapy

## 2022-12-22 NOTE — Patient Outreach (Signed)
[place]  OT FINAL Visit  12/22/2022  Tommy Dyer 1935-11-02 [id number]  Visit:  4- Fourth Visit  Start Time:  [id number] End Time:  1705 Total Minutes:  10  Readiness to Change:  Readiness to Change Score: 10  Patient Education: Education Provided: Yes Education Details: Tips for Aging at [employer]) Educated: Patient Comprehension: Verbalized Understanding  Goals:  Goals Addressed             This Visit's Progress    COMPLETED: Patient Stated       Safety in and out of house at front door (railings on both sides of steps). MET  Name: Tommy Dyer   Date 12/22/2022   OT ACTION PLAN: Functional Mobility   Target Problem Area:   Decreased safety at front steps    Why Problem May Occur:     Only one railing on front steps  The one railing is not as stable as it needs to be      Target Goal(s):   Safety at front steps      STRATEGIES    Modifying your home environment and making it safe     DO:  Install hand rails at front steps   PRACTICE   Based on what we have talked about, you are willing to continue to use:   Hand rails at front steps in order to be more safe going up and down the steps    [place], OTR/L      12/22/2022 Occupational Therapist                       Date        Post Clinical Reasoning: Clinician View Of Client Situation:: Tommy Dyer is still having issues with intermittent left shoulder pain that his MD recently gave him a muscle relaxer for but his system could not handle it (made him quesy on his stomach) so he stopped taking it. We talked about how important it is to be aware of your body when starting a new medicine since as one gets older medications can react differently than they did when one was younger. He continues to stay active. Client View Of His/Her Situation:: He wishes he knew what causes his left shoulder to intermittently be painful for him and his MD is aware. 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 He reported he had not gotten the decorative items for his porch banister as of yet--I told him I would follow up with Tommy Dyer. Next Visit Plan:: This was my last visit with Tommy Dyer.  Tye [place], [place] Aging Gracefully [phone]

## 2023-02-16 ENCOUNTER — Telehealth: Payer: Self-pay | Admitting: Cardiology

## 2023-02-16 NOTE — Telephone Encounter (Signed)
It looks like Tommy Dyer was helping with this. His Eliquis doesn't need a prior authorization, it sounds like he hasn't spent enough out of pocket to qualify for patient assistance. Their program requires pts to have spent 3% of their annual income on medications before they will be covered.

## 2023-02-16 NOTE — Telephone Encounter (Signed)
Patient states he needs Eliquis approved again. States he states he did paperwork but now received the letter letter stating he did not pay enough out of pocket to get discount. He has received one shipment from [facility]. He thinks he needs a prior [place].  Please advise. He brought paperwork to [place] in the past so this may be a new auth needed.

## 2023-02-16 NOTE — Telephone Encounter (Signed)
Patient is requesting to speak with Dorris Fetch. Says that she assisted him with getting his medication approved by [employer]. Would like for her to give him a call back

## 2023-02-24 ENCOUNTER — Other Ambulatory Visit: Payer: Self-pay

## 2023-02-24 NOTE — Patient Outreach (Signed)
Aging Gracefully Program  02/24/2023  Tommy Dyer Sep 25, 1936 [id number]   [facility] Evaluation Interviewer attempted to call patient on today regarding Aging Gracefully referral. No answer from patient after multiple rings. CMA left confidential voicemail for patient to return call.  Will attempt to call back within 1 week.   Vanice Sarah Care Management Assistant [phone]

## 2023-02-27 ENCOUNTER — Telehealth: Payer: Self-pay | Admitting: Cardiology

## 2023-02-27 MED ORDER — APIXABAN 2.5 MG PO TABS: 2.5000 mg | ORAL_TABLET | Freq: Two times a day (BID) | ORAL | 0 refills | Status: DC

## 2023-02-27 NOTE — Telephone Encounter (Signed)
Left voicemail to return call to office.  Samples left at check in and are ready for pick up

## 2023-02-27 NOTE — Telephone Encounter (Signed)
Patient is requesting samples. He is not out of refills and states he would like some samples before he has to pay out of pocket again. I did inform him we can not depend on samples all the time.

## 2023-02-27 NOTE — Telephone Encounter (Signed)
Patient calling the office for samples of medication:   1.  What medication and dosage are you requesting samples for?  apixaban (ELIQUIS) 2.5 MG TABS tablet    2.  Are you currently out of this medication? No- only has a few tablets left

## 2023-02-27 NOTE — Telephone Encounter (Signed)
Ok to give samples - Eliquis 2.5 mg

## 2023-03-02 ENCOUNTER — Other Ambulatory Visit: Payer: Self-pay

## 2023-03-02 NOTE — Patient Outreach (Signed)
Aging Gracefully Program  03/02/2023  Tommy Dyer 08/21/1936 [id number]   [facility] Evaluation Interviewer made contact with patient. Aging Gracefully 5 month survey completed.   Vanice Sarah Care Management Assistant [phone]

## 2023-03-21 ENCOUNTER — Ambulatory Visit: Payer: Medicare HMO | Admitting: Student

## 2023-03-28 DIAGNOSIS — N281 Cyst of kidney, acquired: Secondary | ICD-10-CM | POA: Diagnosis not present

## 2023-03-28 DIAGNOSIS — R3915 Urgency of urination: Secondary | ICD-10-CM | POA: Diagnosis not present

## 2023-03-28 DIAGNOSIS — N184 Chronic kidney disease, stage 4 (severe): Secondary | ICD-10-CM | POA: Diagnosis not present

## 2023-03-28 DIAGNOSIS — C61 Malignant neoplasm of prostate: Secondary | ICD-10-CM | POA: Diagnosis not present

## 2023-03-28 DIAGNOSIS — N200 Calculus of kidney: Secondary | ICD-10-CM | POA: Diagnosis not present

## 2023-04-18 NOTE — Progress Notes (Unsigned)
Cardiology Clinic Note   Date: 04/19/2023 ID: Tommy Dyer, DOB 03/03/1936, MRN [id number]  Primary Cardiologist:  Peter Swaziland, MD  Patient Profile    Tommy Dyer is a 87 y.o. male who presents to the clinic today for routine follow up.     Past medical history significant for: CAD. LHC 03/13/2020 (STEMI): Proximal LAD 40%.  Mid LAD 80%.  OM2 100%.  PCI with DES 2.75 x 22 OM2.  Elevated LVEDP 23 mmHg.  Recommend DAPT x 1 year.  Depending on recovery of renal function consider staged PCI of the mid LAD. Echo 03/14/2020: EF 55 to 60%.  Grade I DD.  Elevated LA pressure.  Mild to moderate aortic valve sclerosis/calcification without stenosis. Nuclear stress test 06/30/2022: LV perfusion is abnormal.  No evidence of ischemia.  There is a medium defect with severe reduction in uptake present in the mid to basal inferior lateral location that is fixed.  There is normal wall motion in the defect area consistent with infarction.  Findings consistent with prior MI in the inferolateral distribution.  Overall low risk study. PAF. Onset 04/06/2020.  On anticoagulation. Hypertension. Hyperlipidemia. Lipid panel 11/10/2022: LDL 54, HDL 26, TG 174, total 109.     History of Present Illness    Tommy Dyer was first evaluated by Dr. Swaziland on 03/13/2020 during hospitalization for acute STEMI. Patient presented to the ER via EMS from a doctor's office where he was waiting for a family member getting a procedure. He reported central chest pain described as pressure beginning the day prior and worsening while in the waiting room. Pain radiated to left arm/neck. Patient was taken emergently to the Cath Lab and underwent PCI with DES to OM2 (further details above).   Patient was last seen by me on 12/06/2022 for hospital follow up after being evaluated for chest pain in the ED with a negative workup. At the time of his visit he reported pain in upper right chest not reproduced with movement or palpation. He  reported [place] [facility] [place], doing lawn work, and walking laps around [place] while his wife shops all without pain. Patient's wife reported he ate pizza prior to his ED visit. He was started on 15 mg isosorbide. Unfortunately, patient could not tolerate isosorbide secondary to headache and diarrhea. He was instructed to stop taking it.   Today, patient is here alone. He reports he continues to have random, brief right chest wall discomfort radiating to right shoulder. The pain is no reproducible with movement or palpation and does not last more than 1-2 minutes. He tried taking a dose of NTG once but it made no difference. He does a lot of yard work and feels he may have more discomfort on the days he does heavier yard work. He is very active walking [place] and walking laps when his wife shops at [facility] without any chest pain or shortness of breath. He denies palpitations. He does have occasional blood on toilet tissue when he has to strain for a bowel movement. Discussed taking stool softener to ensure he does not have to strain. He also has occasional hematuria that is not unexpected in the setting of prostate cancer history. He will also get an occasional bloody nose but has not had one in quite a while. He denies lower extremity edema, shortness of breath, DOE, orthopnea or PND.     ROS: All other  systems reviewed and are otherwise negative except as noted in History of Present Illness.  Studies Reviewed     EKG is not ordered today.   Risk Assessment/Calculations     CHA2DS2-VASc Score = 4   This indicates a 4.8% annual risk of stroke. The patient's score is based upon: CHF History: 0 HTN History: 1 Diabetes History: 0 Stroke History: 0 Vascular Disease History: 1 Age Score: 2 Gender Score: 0             Physical Exam    VS:  BP (!) 140/64 (BP Location: Left Arm, Patient Position: Sitting, Cuff Size: Normal)    Pulse 75   Ht 6' (1.829 m)   Wt 203 lb 9.6 oz (92.4 kg)   SpO2 97%   BMI 27.61 kg/m  , BMI Body mass index is 27.61 kg/m.  GEN: Well nourished, well developed, in no acute distress. Neck: No JVD or carotid bruits. Cardiac:  RRR. No murmurs. No rubs or gallops.   Respiratory:  Respirations regular and unlabored. Clear to auscultation without rales, wheezing or rhonchi. GI: Soft, nontender, nondistended. Extremities: Radials/DP/PT 2+ and equal bilaterally. No clubbing or cyanosis. No edema.  Skin: Warm and dry, no rash. Neuro: Strength intact.  Assessment & Plan    CAD.  S/p PCI with DES to OM2 August 2021.  Mid LAD 80% with plan for potential staged PCI at a later date that has been treated medically since.  Nuclear stress test October 2023 low risk study without evidence of ischemia.  Patient with complaints of upper right chest pain that is very brief lasting no more than a minute and not associated with shortness of breath or activity.  He was started on isosorbide but could not tolerate secondary to headaches and diarrhea. He reports continued chest discomfort as described. No further workup is indicated at this time. Continue atorvastatin, metoprolol, Eliquis, as needed SL NTG. PAF.  Onset July 2021.  Patient denies palpitations.  He has regular rate and rhythm on auscultation today.  He reports occasional bleeding as described in HPI. No sustained bleeding concerns. Continue metoprolol and Eliquis. Hypertension.  BP today 140/64 on intake and 132/68 on my recheck.  Patient denies headaches.  Continue irbesartan and metoprolol. Hyperlipidemia.  LDL February 2024 54, at goal.  Continue atorvastatin.  Disposition: Return in 6 months or sooner as needed.          Signed, Etta Grandchild. Zoeie Ritter, DNP, NP-C

## 2023-04-19 ENCOUNTER — Ambulatory Visit: Payer: Medicare HMO | Attending: Student | Admitting: Student

## 2023-04-19 ENCOUNTER — Encounter: Payer: Self-pay | Admitting: Student

## 2023-04-19 VITALS — BP 132/68 | HR 75 | Ht 72.0 in | Wt 203.6 lb

## 2023-04-19 DIAGNOSIS — I480 Paroxysmal atrial fibrillation: Secondary | ICD-10-CM | POA: Diagnosis not present

## 2023-04-19 DIAGNOSIS — I10 Essential (primary) hypertension: Secondary | ICD-10-CM

## 2023-04-19 DIAGNOSIS — I2510 Atherosclerotic heart disease of native coronary artery without angina pectoris: Secondary | ICD-10-CM

## 2023-04-19 DIAGNOSIS — E785 Hyperlipidemia, unspecified: Secondary | ICD-10-CM

## 2023-04-19 MED ORDER — APIXABAN 2.5 MG PO TABS: 2.5000 mg | ORAL_TABLET | Freq: Two times a day (BID) | ORAL | Status: DC

## 2023-04-19 NOTE — Patient Instructions (Signed)
Medication Instructions:  No Changes *If you need a refill on your cardiac medications before your next appointment, please call your pharmacy*   Lab Work: No labs If you have labs (blood work) drawn today and your tests are completely normal, you will receive your results only by: MyChart Message (if you have MyChart) OR A paper copy in the mail If you have any lab test that is abnormal or we need to change your treatment, we will call you to review the results.   Testing/Procedures: No Testing   Follow-Up: At [facility], you and your health needs are our priority.  As part of our continuing mission to provide you with exceptional heart care, we have created designated Provider Care Teams.  These Care Teams include your primary Cardiologist (physician) and Advanced Practice Providers (APPs -  Physician Assistants and Nurse Practitioners) who all work together to provide you with the care you need, when you need it.  We recommend signing up for the patient portal called "MyChart".  Sign up information is provided on this After Visit Summary.  MyChart is used to connect with patients for Virtual Visits (Telemedicine).  Patients are able to view lab/test results, encounter notes, upcoming appointments, etc.  Non-urgent messages can be sent to your provider as well.   To learn more about what you can do with MyChart, go to [email or web address].    Your next appointment:   6 month(s)  Provider:   Peter Swaziland, MD

## 2023-04-26 ENCOUNTER — Telehealth: Payer: Self-pay | Admitting: Cardiology

## 2023-04-26 NOTE — Telephone Encounter (Signed)
Paper Work Dropped Off: Patient assistance   Date:04/26/2023  Location of paper:  Community education officer

## 2023-04-27 NOTE — Telephone Encounter (Signed)
Received Eliquis patient assistance paper work.Dr.Jordan out of office.He will sign when he is back in office.

## 2023-05-02 DIAGNOSIS — I480 Paroxysmal atrial fibrillation: Secondary | ICD-10-CM | POA: Diagnosis not present

## 2023-05-02 DIAGNOSIS — N1832 Chronic kidney disease, stage 3b: Secondary | ICD-10-CM | POA: Diagnosis not present

## 2023-05-02 DIAGNOSIS — I2510 Atherosclerotic heart disease of native coronary artery without angina pectoris: Secondary | ICD-10-CM | POA: Diagnosis not present

## 2023-05-02 DIAGNOSIS — I213 ST elevation (STEMI) myocardial infarction of unspecified site: Secondary | ICD-10-CM | POA: Diagnosis not present

## 2023-05-02 DIAGNOSIS — I129 Hypertensive chronic kidney disease with stage 1 through stage 4 chronic kidney disease, or unspecified chronic kidney disease: Secondary | ICD-10-CM | POA: Diagnosis not present

## 2023-05-08 NOTE — Telephone Encounter (Signed)
Dr.Jordan signed patient assistance paperwork for Eliquis.Faxed to Alver Fisher at fax # [phone].

## 2023-05-17 DIAGNOSIS — I10 Essential (primary) hypertension: Secondary | ICD-10-CM | POA: Diagnosis not present

## 2023-05-17 DIAGNOSIS — N200 Calculus of kidney: Secondary | ICD-10-CM | POA: Diagnosis not present

## 2023-05-17 DIAGNOSIS — E7800 Pure hypercholesterolemia, unspecified: Secondary | ICD-10-CM | POA: Diagnosis not present

## 2023-05-17 DIAGNOSIS — D6869 Other thrombophilia: Secondary | ICD-10-CM | POA: Diagnosis not present

## 2023-05-17 DIAGNOSIS — I480 Paroxysmal atrial fibrillation: Secondary | ICD-10-CM | POA: Diagnosis not present

## 2023-05-17 DIAGNOSIS — C61 Malignant neoplasm of prostate: Secondary | ICD-10-CM | POA: Diagnosis not present

## 2023-05-17 DIAGNOSIS — D508 Other iron deficiency anemias: Secondary | ICD-10-CM | POA: Diagnosis not present

## 2023-05-17 DIAGNOSIS — Z0000 Encounter for general adult medical examination without abnormal findings: Secondary | ICD-10-CM | POA: Diagnosis not present

## 2023-05-17 DIAGNOSIS — Z1331 Encounter for screening for depression: Secondary | ICD-10-CM | POA: Diagnosis not present

## 2023-05-17 DIAGNOSIS — N1832 Chronic kidney disease, stage 3b: Secondary | ICD-10-CM | POA: Diagnosis not present

## 2023-06-16 ENCOUNTER — Telehealth: Payer: Self-pay

## 2023-06-16 NOTE — Telephone Encounter (Signed)
Received letter from [facility] patient assistance.Patient approved for Eliquis.

## 2023-07-13 DIAGNOSIS — Z23 Encounter for immunization: Secondary | ICD-10-CM | POA: Diagnosis not present

## 2023-07-18 ENCOUNTER — Other Ambulatory Visit: Payer: Self-pay

## 2023-07-18 NOTE — Patient Outreach (Signed)
Aging Gracefully Program  07/18/2023  Tommy Dyer September 05, 1936 [id number]   [facility] Evaluation Interviewer made contact with patient. Aging Gracefully 65month survey completed.    Vanice Sarah Care Management Assistant [phone]

## 2023-09-09 ENCOUNTER — Other Ambulatory Visit: Payer: Self-pay | Admitting: Cardiology

## 2023-09-29 ENCOUNTER — Telehealth: Payer: Self-pay | Admitting: Cardiology

## 2023-09-29 NOTE — Telephone Encounter (Signed)
 Noted  FWD to primary RN as Lorain Childes

## 2023-09-29 NOTE — Telephone Encounter (Signed)
 Pt called in to inform that he will be dropping off his Chriss Czar - Eliquis app for Dr. Swaziland to sign next week.

## 2023-10-03 NOTE — Telephone Encounter (Signed)
 Paper Work Dropped Off: Patient Assistance Paperwork  Date: 10/02/2022  Location of paper:  Provider Mailbox

## 2023-10-05 ENCOUNTER — Telehealth: Payer: Self-pay

## 2023-10-05 NOTE — Telephone Encounter (Signed)
Tommy Dyer patient assistance forms for Eliquis completed and faxed to fax # [phone].

## 2023-10-07 NOTE — Progress Notes (Signed)
Cardiology Office Note:    Date:  10/16/2023   ID:  Tommy Dyer, DOB 04-02-1936, MRN [id number]  PCP:  Renford Dills, MD   [facility] Providers Cardiologist:  Alec Mcphee [place], MD     Referring MD: Renford Dills, MD   Chief Complaint  Patient presents with   Shortness of Breath   Dizziness    Patient stated that he has had some chest pain and SOB and vertigo and gets nose bleeds the thinks the nose bleeds is due to being sick/sinus    History of Present Illness:    Tommy Dyer is a 88 y.o. male with a hx of CAD, HTN, HLD, CKD stage IV, prostate cancer and history of A-fib.  Patient had lateral STEMI in June 2021 and found to have 100% occluded second marginal branch treated with DES, he has 80% mid LAD and 40% proximal LAD residual. Recommendation is if he has refractory angina, can consider PCI of LAD at a later time.  Echocardiogram obtained on 03/14/2020 showed EF 55 to 60%, grade 1 DD.  He was discharged on Toprol-XL, amlodipine, Brilinta and aspirin.  He came back to the ED on 03/26/2020 with dizziness.  EKG demonstrated atrial fibrillation with RVR with heart rate of 131.  Plavix was started to replace Brilinta and Eliquis was started at 2.5 mg twice a day.  Hospital course complicated by AKI with serum creatinine of 2.86, baseline creatinine is about 2.5.  He was seen in the ED in December 2022 with COVID and was treated with molnupiravir.    He is being followed by Dr. Louie Bun of [facility].  He was seen in July 2023 with atypical chest pain. This recurred in the fall   Myoview was done showing preserved EF, fixed inferolateral scar and no ischemia. Medical management recommended.   He has really not had any chest pain. Did note some shoulder discomfort and chest pain working on his car yesterday - this resolved with Tylenol. No other chest pain. No swelling. No significant bleeding.    Past Medical History:  Diagnosis Date   Arthritis    CAD  (coronary artery disease)    a. 02/2020: STEMI with DES to 100% 2nd Mrg stenosis. Residual 80% mid-LAD stenosis with consideration of staged PCI recommended.     Gastric ulcer    GERD (gastroesophageal reflux disease)    History of kidney stones    Hypercholesterolemia    Hypertension    Kidney stones    Osteoporosis    Prostate cancer (HCC)    STEMI (ST elevation myocardial infarction) [facility])     Past Surgical History:  Procedure Laterality Date   colonscopy     CORONARY/GRAFT ACUTE MI REVASCULARIZATION N/A 03/13/2020   Procedure: CORONARY/GRAFT ACUTE MI REVASCULARIZATION;  Surgeon: Sw[place]iland, Melodie Ashworth M, MD;  Location: [facility] INVASIVE CV LAB;  Service: Cardiovascular;  Laterality: N/A;   EXTRACORPOREAL SHOCK WAVE LITHOTRIPSY Right 06/21/2018   Procedure: RIGHT EXTRACORPOREAL SHOCK WAVE LITHOTRIPSY (ESWL);  Surgeon: Crist Fat, MD;  Location: [facility] ORS;  Service: Urology;  Laterality: Right;   LEFT HEART CATH AND CORONARY ANGIOGRAPHY N/A 03/13/2020   Procedure: LEFT HEART CATH AND CORONARY ANGIOGRAPHY;  Surgeon: Sw[place]iland, Acasia Skilton M, MD;  Location: [facility] INVASIVE CV LAB;  Service: Cardiovascular;  Laterality: N/A;   PROSTATE BIOPSY     PROSTATE BIOPSY      Current Medications: Current Meds  Medication Sig   acetaminophen (TYLENOL) 500 MG tablet Take 1,000 mg by mouth  every 6 (six) hours as needed for headache or moderate pain.   apixaban (ELIQUIS) 2.5 MG TABS tablet Take 1 tablet (2.5 mg total) by mouth 2 (two) times daily.   atorvastatin (LIPITOR) 80 MG tablet TAKE 1 TABLET EVERY DAY   Carboxymethylcellulose Sodium (REFRESH TEARS OP) Place 1 drop into both eyes daily as needed (dry eyes).    Famotidine (PEPCID AC PO) Take 1 tablet by mouth daily as needed (heartburn).   fluticasone (FLONASE) 50 MCG/ACT nasal spray Place 1 spray into both nostrils daily as needed for allergies or rhinitis.    irbesartan (AVAPRO) 300 MG tablet Take 300 mg by mouth daily.   isosorbide mononitrate (IMDUR) 30 MG 24  hr tablet Take 0.5 tablets (15 mg total) by mouth daily.   Lidocaine-Glycerin (PREPARATION H EX) Place 1 application rectally daily as needed (hemorrhoids/pain/itching).    meclizine (ANTIVERT) 25 MG tablet Take 25 mg by mouth daily as needed for dizziness.   metoprolol succinate (TOPROL-XL) 50 MG 24 hr tablet Take 1 tablet (50 mg total) by mouth daily. Take with or immediately following a meal.   nitroGLYCERIN (NITROSTAT) 0.4 MG SL tablet Place 1 tablet (0.4 mg total) under the tongue every 5 (five) minutes x 3 doses as needed for chest pain.   tamsulosin (FLOMAX) 0.4 MG CAPS capsule Take 0.4 mg by mouth every other day.     Allergies:   Patient has no known allergies.   Social History   Socioeconomic History   Marital status: Married    Spouse name: Marily Memos   Number of children: Not on file   Years of education: Not on file   Highest education level: Not on file  Occupational History   Not on file  Tobacco Use   Smoking status: Former    Current packs/day: 0.00    Average packs/day: 1 pack/day for 20.0 years (20.0 ttl pk-yrs)    Types: Cigarettes    Start date: 09/26/1973    Quit date: 09/26/1993    Years since quitting: 30.0   Smokeless tobacco: Never  Vaping Use   Vaping status: Never Used  Substance and Sexual Activity   Alcohol use: No   Drug use: No   Sexual activity: Not Currently  Other Topics Concern   Not on file  Social History Narrative   Retired from [employer]   Social Drivers of Corporate investment banker Strain: Not on [employer] Insecurity: Not on file  Transportation Needs: Not on file  Physical Activity: Not on file  Stress: Not on file  Social Connections: Not on file     Family History: The patient's family history includes Breast cancer in his maternal grandmother; Cancer in his mother; Diabetes in his father and mother; High blood pressure in his father and mother. There is no history of Colon cancer or Stomach cancer.  ROS:   Please see the  history of present illness.     All other systems reviewed and are negative.  EKGs/Labs/Other Studies Reviewed:    The following studies were reviewed today:  Echo 03/14/2020 1. Left ventricular ejection fraction, by estimation, is 55 to 60%. The  left ventricle has normal function. The left ventricle has no regional  wall motion abnormalities. Left ventricular diastolic parameters are  consistent with Grade I diastolic  dysfunction (impaired relaxation). Elevated left atrial pressure.   2. Right ventricular systolic function is normal. The right ventricular  size is normal.   3. The mitral valve is normal  in structure. No evidence of mitral valve  regurgitation. No evidence of mitral stenosis.   4. The aortic valve is normal in structure. Aortic valve regurgitation is  not visualized. Mild to moderate aortic valve sclerosis/calcification is  present, without any evidence of aortic stenosis.   5. The inferior vena cava is normal in size with greater than 50%  respiratory variability, suggesting right atrial pressure of 3 mmHg.   Myoview 06/30/22: Study Highlights      No ST deviation was noted.   LV perfusion is abnormal. There is no evidence of ischemia. There is evidence of infarction. Defect 1: There is a medium defect with severe reduction in uptake present in the mid to basal inferolateral location(s) that is fixed. There is normal wall motion in the defect area. Consistent with infarction.   Left ventricular function is normal.  EF 53%.  End diastolic cavity size is normal.   Findings are consistent with prior myocardial infarction in the inferolateral distribution. The study is overall low risk.  There is no significant ischemia identified.  EKG Interpretation Date/Time:  Monday October 16 2023 14:01:45 EST Ventricular Rate:  89 PR Interval:  174 QRS Duration:  80 QT Interval:  366 QTC Calculation: 445 R Axis:   0  Text Interpretation: Normal sinus rhythm Normal ECG When  compared with ECG of 25-Nov-2022 15:07, No significant change was found Confirmed by [place], Kilan Banfill [phone]) on 10/16/2023 2:14:04 PM   Recent Labs: 11/25/2022: BUN 32; Creatinine, Ser 2.34; Hemoglobin 12.7; Platelets 157; Potassium 3.9; Sodium 134  Recent Lipid Panel    Component Value Date/Time   CHOL 124 05/12/2020 0843   TRIG 91 05/12/2020 0843   HDL 27 (L) 05/12/2020 0843   CHOLHDL 4.6 05/12/2020 0843   CHOLHDL 7.3 03/14/2020 0414   VLDL 24 03/14/2020 0414   LDLCALC 79 05/12/2020 0843   Dated 05/10/22: A1c 6.3%. cholesterol 108, triglycerides 126, HDL 28, LDL 58.  Dated 09/13/22: BUN 32, creatinine 2.23. otherwise CMET OK.  Dated 05/17/23: cholesterol 103, triglycerides 266, HDL 24, LDL 39. A1c 6.3%. creatinine 2.56. BUN 31. GFR 27. Otherwise CBC and CMET normal.   Risk Assessment/Calculations:    CHA2DS2-VASc Score =     This indicates a  % annual risk of stroke. The patient's score is based upon:            Physical Exam:    VS:  BP 138/70 (BP Location: Left Arm, Cuff Size: Large)   Pulse 89   Ht 6' (1.829 m)   Wt 209 lb (94.8 kg)   SpO2 97%   BMI 28.35 kg/m        Wt Readings from Last 3 Encounters:  10/16/23 209 lb (94.8 kg)  04/19/23 203 lb 9.6 oz (92.4 kg)  12/06/22 206 lb (93.4 kg)     GEN:  Well nourished, well developed in no acute distress HEENT: Normal NECK: No JVD; No carotid bruits LYMPHATICS: No lymphadenopathy CARDIAC: RRR, no murmurs, rubs, gallops RESPIRATORY:  Clear to auscultation without rales, wheezing or rhonchi  ABDOMEN: Soft, non-tender, non-distended MUSCULOSKELETAL:  No edema; No deformity  SKIN: Warm and dry NEUROLOGIC:  Alert and oriented x 3 PSYCHIATRIC:  Normal affect   ASSESSMENT:    1. Coronary artery disease of native artery of native heart with stable angina pectoris (HCC)   2. Primary hypertension   3. Paroxysmal atrial fibrillation (HCC)   4. Hyperlipidemia LDL goal <70      PLAN:    In order  of problems listed  above:  CAD: atypical chest pain. Myoview without ischemia and EF 53%. Continue medical therapy. No real active angina  Hypertension: Blood pressure is OK  Hyperlipidemia: On Lipitor. LDL 39 is at goal.   CKD stage IV: Followed by Dr. Louie Bun of [facility]  PAF: On Eliquis ( lower dose due to age and CKD) and metoprolol            Medication Adjustments/Labs and Tests Ordered: Current medicines are reviewed at length with the patient today.  Concerns regarding medicines are outlined above.  Orders Placed This Encounter  Procedures   EKG 12-Lead   No orders of the defined types were placed in this encounter.   There are no Patient Instructions on file for this visit.   Signed, Iyahna Obriant Sw[place]il[place], MD  10/16/2023 2:18 PM    [facility]

## 2023-10-10 NOTE — Telephone Encounter (Signed)
 Already taken care of see 10/05/23 telephone note.

## 2023-10-16 ENCOUNTER — Ambulatory Visit: Payer: Medicare HMO | Attending: Cardiology | Admitting: Cardiology

## 2023-10-16 ENCOUNTER — Encounter: Payer: Self-pay | Admitting: Cardiology

## 2023-10-16 VITALS — BP 138/70 | HR 89 | Ht 72.0 in | Wt 209.0 lb

## 2023-10-16 DIAGNOSIS — I10 Essential (primary) hypertension: Secondary | ICD-10-CM | POA: Diagnosis not present

## 2023-10-16 DIAGNOSIS — I25118 Atherosclerotic heart disease of native coronary artery with other forms of angina pectoris: Secondary | ICD-10-CM | POA: Diagnosis not present

## 2023-10-16 DIAGNOSIS — I480 Paroxysmal atrial fibrillation: Secondary | ICD-10-CM | POA: Diagnosis not present

## 2023-10-16 DIAGNOSIS — E785 Hyperlipidemia, unspecified: Secondary | ICD-10-CM | POA: Diagnosis not present

## 2023-10-16 NOTE — Patient Instructions (Signed)
Medication Instructions:  Continue same medications *If you need a refill on your cardiac medications before your next appointment, please call your pharmacy*   Lab Work: None ordered   Testing/Procedures: None ordered   Follow-Up: At [facility], you and your health needs are our priority.  As part of our continuing mission to provide you with exceptional heart care, we have created designated Provider Care Teams.  These Care Teams include your primary Cardiologist (physician) and Advanced Practice Providers (APPs -  Physician Assistants and Nurse Practitioners) who all work together to provide you with the care you need, when you need it.  We recommend signing up for the patient portal called "MyChart".  Sign up information is provided on this After Visit Summary.  MyChart is used to connect with patients for Virtual Visits (Telemedicine).  Patients are able to view lab/test results, encounter notes, upcoming appointments, etc.  Non-urgent messages can be sent to your provider as well.   To learn more about what you can do with MyChart, go to [email or web address].    Your next appointment:  6 months   Call in April to schedule July appointment    ( New Office )    Provider:  Dr.Jordan

## 2023-10-23 ENCOUNTER — Telehealth: Payer: Self-pay | Admitting: Cardiology

## 2023-10-23 MED ORDER — APIXABAN 2.5 MG PO TABS: 2.5000 mg | ORAL_TABLET | Freq: Two times a day (BID) | ORAL | 1 refills | Status: DC

## 2023-10-23 NOTE — Telephone Encounter (Signed)
Prescription refill request for Eliquis received. Indication: PAF Last office visit: 10/16/23  P Sw[place]iland MD Scr: 2.34 on 11/25/22  Epic Age: 88 Weight: 94.8kg  Based on above findings Eliquis 2.5mg  twice daily is the appropriate dose.  Refill approved.

## 2023-10-23 NOTE — Telephone Encounter (Signed)
*  STAT* If patient is at the pharmacy, call can be transferred to refill team.   1. Which medications need to be refilled? (please list name of each medication and dose if known) apixaban (ELIQUIS) 2.5 MG TABS tablet    2. Would you like to learn more about the convenience, safety, & potential cost savings by using the [facility]?    3. Are you open to using the [facility] (Type [facility].  ).   4. Which pharmacy/location (including street and city if local pharmacy) is medication to be sent to? [facility] - [place], [place] - [address]    5. Do they need a 30 day or 90 day supply? 30 day

## 2023-10-30 DIAGNOSIS — N1832 Chronic kidney disease, stage 3b: Secondary | ICD-10-CM | POA: Diagnosis not present

## 2023-11-07 DIAGNOSIS — I480 Paroxysmal atrial fibrillation: Secondary | ICD-10-CM | POA: Diagnosis not present

## 2023-11-07 DIAGNOSIS — N1832 Chronic kidney disease, stage 3b: Secondary | ICD-10-CM | POA: Diagnosis not present

## 2023-11-07 DIAGNOSIS — I129 Hypertensive chronic kidney disease with stage 1 through stage 4 chronic kidney disease, or unspecified chronic kidney disease: Secondary | ICD-10-CM | POA: Diagnosis not present

## 2023-11-07 DIAGNOSIS — I2510 Atherosclerotic heart disease of native coronary artery without angina pectoris: Secondary | ICD-10-CM | POA: Diagnosis not present

## 2023-11-07 DIAGNOSIS — E785 Hyperlipidemia, unspecified: Secondary | ICD-10-CM | POA: Diagnosis not present

## 2023-11-16 ENCOUNTER — Telehealth: Payer: Self-pay | Admitting: Cardiology

## 2023-11-16 NOTE — Telephone Encounter (Signed)
Pt c/o medication issue:  1. Name of Medication: Amlodipine 5 mg  2. How are you currently taking this medication (dosage and times per day)?   3. Are you having a reaction (difficulty breathing--STAT)?   4. What is your medication issue? Patient says his kidney doctor put him on Amlodipine. Patient wants to  know if Dr Swaziland thinks it is alright for him to take Amlodipine? He said he saw where this medicine might cause Chest Pains also.

## 2023-11-16 NOTE — Telephone Encounter (Signed)
Patient identification verified by 2 forms. Marilynn Rail, RN    Called and spoke to patient  Patient states:   -kidney provider started him on Amlodipine 5mg  daily last week  -has not started taking this medication   -kidney provider started him on this medication due to BP concerns   -Takes irbesartan 300mg  daily   -Takes Metoprolol 50mg  daily   -BP log for past week:   -2/20: 132/79  -2/19: 142/77  -2/18: 137/77  -2/17: 144/85  -2/16: 121/73  -2/15: 103/64  -2/12: 135/84  -2/11: 131/82   -2/10: 140/82  -patient would like to know if Dr. Swaziland is okay with him starting this medication  Informed patient message sent for input/advisement  Patient has no further questions at this time

## 2023-11-17 ENCOUNTER — Telehealth: Payer: Self-pay | Admitting: *Deleted

## 2023-11-17 NOTE — Telephone Encounter (Signed)
 Attempted to call patient, no answer left message requesting call back.

## 2023-11-17 NOTE — Telephone Encounter (Signed)
Called and made patient aware that per Dr. Swaziland it is ok to take amlodipine.  Per Dr. Swaziland this will help his BP and should not cause chest pain. Patient verbalized an understanding.

## 2023-11-17 NOTE — Telephone Encounter (Signed)
 Patient returned call

## 2023-11-22 DIAGNOSIS — I480 Paroxysmal atrial fibrillation: Secondary | ICD-10-CM | POA: Diagnosis not present

## 2023-11-22 DIAGNOSIS — N1832 Chronic kidney disease, stage 3b: Secondary | ICD-10-CM | POA: Diagnosis not present

## 2023-11-22 DIAGNOSIS — N200 Calculus of kidney: Secondary | ICD-10-CM | POA: Diagnosis not present

## 2023-11-22 DIAGNOSIS — D508 Other iron deficiency anemias: Secondary | ICD-10-CM | POA: Diagnosis not present

## 2023-11-22 DIAGNOSIS — E7800 Pure hypercholesterolemia, unspecified: Secondary | ICD-10-CM | POA: Diagnosis not present

## 2023-11-22 DIAGNOSIS — D6869 Other thrombophilia: Secondary | ICD-10-CM | POA: Diagnosis not present

## 2023-11-22 DIAGNOSIS — Z23 Encounter for immunization: Secondary | ICD-10-CM | POA: Diagnosis not present

## 2023-11-22 DIAGNOSIS — R7303 Prediabetes: Secondary | ICD-10-CM | POA: Diagnosis not present

## 2023-11-22 DIAGNOSIS — I10 Essential (primary) hypertension: Secondary | ICD-10-CM | POA: Diagnosis not present

## 2024-01-29 DIAGNOSIS — N1832 Chronic kidney disease, stage 3b: Secondary | ICD-10-CM | POA: Diagnosis not present

## 2024-02-05 DIAGNOSIS — E785 Hyperlipidemia, unspecified: Secondary | ICD-10-CM | POA: Diagnosis not present

## 2024-02-05 DIAGNOSIS — I480 Paroxysmal atrial fibrillation: Secondary | ICD-10-CM | POA: Diagnosis not present

## 2024-02-05 DIAGNOSIS — I2510 Atherosclerotic heart disease of native coronary artery without angina pectoris: Secondary | ICD-10-CM | POA: Diagnosis not present

## 2024-02-05 DIAGNOSIS — I129 Hypertensive chronic kidney disease with stage 1 through stage 4 chronic kidney disease, or unspecified chronic kidney disease: Secondary | ICD-10-CM | POA: Diagnosis not present

## 2024-02-05 DIAGNOSIS — N184 Chronic kidney disease, stage 4 (severe): Secondary | ICD-10-CM | POA: Diagnosis not present

## 2024-03-15 ENCOUNTER — Other Ambulatory Visit: Payer: Self-pay | Admitting: Cardiology

## 2024-03-21 DIAGNOSIS — N1832 Chronic kidney disease, stage 3b: Secondary | ICD-10-CM | POA: Diagnosis not present

## 2024-03-21 DIAGNOSIS — I10 Essential (primary) hypertension: Secondary | ICD-10-CM | POA: Diagnosis not present

## 2024-03-21 DIAGNOSIS — I480 Paroxysmal atrial fibrillation: Secondary | ICD-10-CM | POA: Diagnosis not present

## 2024-03-25 DIAGNOSIS — I480 Paroxysmal atrial fibrillation: Secondary | ICD-10-CM | POA: Diagnosis not present

## 2024-03-25 DIAGNOSIS — C61 Malignant neoplasm of prostate: Secondary | ICD-10-CM | POA: Diagnosis not present

## 2024-03-25 DIAGNOSIS — I10 Essential (primary) hypertension: Secondary | ICD-10-CM | POA: Diagnosis not present

## 2024-03-25 DIAGNOSIS — E7800 Pure hypercholesterolemia, unspecified: Secondary | ICD-10-CM | POA: Diagnosis not present

## 2024-03-25 DIAGNOSIS — N1832 Chronic kidney disease, stage 3b: Secondary | ICD-10-CM | POA: Diagnosis not present

## 2024-04-19 ENCOUNTER — Other Ambulatory Visit: Payer: Self-pay | Admitting: Cardiology

## 2024-04-19 DIAGNOSIS — N1832 Chronic kidney disease, stage 3b: Secondary | ICD-10-CM | POA: Diagnosis not present

## 2024-04-19 DIAGNOSIS — I480 Paroxysmal atrial fibrillation: Secondary | ICD-10-CM | POA: Diagnosis not present

## 2024-04-19 DIAGNOSIS — I10 Essential (primary) hypertension: Secondary | ICD-10-CM | POA: Diagnosis not present

## 2024-04-19 NOTE — Telephone Encounter (Signed)
 Prescription refill request for Eliquis  received. Indication:afib Last office visit:1/25 Scr:2.35  5/25 Age: 88 Weight:94.8  kg  Prescription refilled

## 2024-04-25 DIAGNOSIS — C61 Malignant neoplasm of prostate: Secondary | ICD-10-CM | POA: Diagnosis not present

## 2024-04-25 DIAGNOSIS — I10 Essential (primary) hypertension: Secondary | ICD-10-CM | POA: Diagnosis not present

## 2024-04-25 DIAGNOSIS — N1832 Chronic kidney disease, stage 3b: Secondary | ICD-10-CM | POA: Diagnosis not present

## 2024-04-25 DIAGNOSIS — E7800 Pure hypercholesterolemia, unspecified: Secondary | ICD-10-CM | POA: Diagnosis not present

## 2024-04-25 DIAGNOSIS — I480 Paroxysmal atrial fibrillation: Secondary | ICD-10-CM | POA: Diagnosis not present

## 2024-04-29 DIAGNOSIS — C61 Malignant neoplasm of prostate: Secondary | ICD-10-CM | POA: Diagnosis not present

## 2024-04-29 DIAGNOSIS — R310 Gross hematuria: Secondary | ICD-10-CM | POA: Diagnosis not present

## 2024-04-29 DIAGNOSIS — N200 Calculus of kidney: Secondary | ICD-10-CM | POA: Diagnosis not present

## 2024-05-19 DIAGNOSIS — I480 Paroxysmal atrial fibrillation: Secondary | ICD-10-CM | POA: Diagnosis not present

## 2024-05-19 DIAGNOSIS — N1832 Chronic kidney disease, stage 3b: Secondary | ICD-10-CM | POA: Diagnosis not present

## 2024-05-19 DIAGNOSIS — I10 Essential (primary) hypertension: Secondary | ICD-10-CM | POA: Diagnosis not present

## 2024-05-21 DIAGNOSIS — D508 Other iron deficiency anemias: Secondary | ICD-10-CM | POA: Diagnosis not present

## 2024-05-21 DIAGNOSIS — I480 Paroxysmal atrial fibrillation: Secondary | ICD-10-CM | POA: Diagnosis not present

## 2024-05-21 DIAGNOSIS — C61 Malignant neoplasm of prostate: Secondary | ICD-10-CM | POA: Diagnosis not present

## 2024-05-21 DIAGNOSIS — E1122 Type 2 diabetes mellitus with diabetic chronic kidney disease: Secondary | ICD-10-CM | POA: Diagnosis not present

## 2024-05-21 DIAGNOSIS — I10 Essential (primary) hypertension: Secondary | ICD-10-CM | POA: Diagnosis not present

## 2024-05-21 DIAGNOSIS — N200 Calculus of kidney: Secondary | ICD-10-CM | POA: Diagnosis not present

## 2024-05-21 DIAGNOSIS — D6869 Other thrombophilia: Secondary | ICD-10-CM | POA: Diagnosis not present

## 2024-05-21 DIAGNOSIS — E7800 Pure hypercholesterolemia, unspecified: Secondary | ICD-10-CM | POA: Diagnosis not present

## 2024-05-21 DIAGNOSIS — J3489 Other specified disorders of nose and nasal sinuses: Secondary | ICD-10-CM | POA: Diagnosis not present

## 2024-05-21 DIAGNOSIS — N1832 Chronic kidney disease, stage 3b: Secondary | ICD-10-CM | POA: Diagnosis not present

## 2024-05-26 DIAGNOSIS — I480 Paroxysmal atrial fibrillation: Secondary | ICD-10-CM | POA: Diagnosis not present

## 2024-05-26 DIAGNOSIS — N1832 Chronic kidney disease, stage 3b: Secondary | ICD-10-CM | POA: Diagnosis not present

## 2024-05-26 DIAGNOSIS — I10 Essential (primary) hypertension: Secondary | ICD-10-CM | POA: Diagnosis not present

## 2024-05-26 DIAGNOSIS — C61 Malignant neoplasm of prostate: Secondary | ICD-10-CM | POA: Diagnosis not present

## 2024-05-26 DIAGNOSIS — E7800 Pure hypercholesterolemia, unspecified: Secondary | ICD-10-CM | POA: Diagnosis not present

## 2024-06-10 DIAGNOSIS — N184 Chronic kidney disease, stage 4 (severe): Secondary | ICD-10-CM | POA: Diagnosis not present

## 2024-06-18 DIAGNOSIS — N1832 Chronic kidney disease, stage 3b: Secondary | ICD-10-CM | POA: Diagnosis not present

## 2024-06-18 DIAGNOSIS — I480 Paroxysmal atrial fibrillation: Secondary | ICD-10-CM | POA: Diagnosis not present

## 2024-06-18 DIAGNOSIS — I10 Essential (primary) hypertension: Secondary | ICD-10-CM | POA: Diagnosis not present

## 2024-06-19 DIAGNOSIS — C61 Malignant neoplasm of prostate: Secondary | ICD-10-CM | POA: Diagnosis not present

## 2024-06-19 DIAGNOSIS — I129 Hypertensive chronic kidney disease with stage 1 through stage 4 chronic kidney disease, or unspecified chronic kidney disease: Secondary | ICD-10-CM | POA: Diagnosis not present

## 2024-06-19 DIAGNOSIS — N184 Chronic kidney disease, stage 4 (severe): Secondary | ICD-10-CM | POA: Diagnosis not present

## 2024-06-19 DIAGNOSIS — E785 Hyperlipidemia, unspecified: Secondary | ICD-10-CM | POA: Diagnosis not present

## 2024-06-19 DIAGNOSIS — I480 Paroxysmal atrial fibrillation: Secondary | ICD-10-CM | POA: Diagnosis not present

## 2024-06-20 ENCOUNTER — Other Ambulatory Visit: Payer: Self-pay | Admitting: Physician Assistant

## 2024-06-25 DIAGNOSIS — I480 Paroxysmal atrial fibrillation: Secondary | ICD-10-CM | POA: Diagnosis not present

## 2024-06-25 DIAGNOSIS — I10 Essential (primary) hypertension: Secondary | ICD-10-CM | POA: Diagnosis not present

## 2024-06-25 DIAGNOSIS — C61 Malignant neoplasm of prostate: Secondary | ICD-10-CM | POA: Diagnosis not present

## 2024-06-25 DIAGNOSIS — E7800 Pure hypercholesterolemia, unspecified: Secondary | ICD-10-CM | POA: Diagnosis not present

## 2024-06-25 DIAGNOSIS — N1832 Chronic kidney disease, stage 3b: Secondary | ICD-10-CM | POA: Diagnosis not present

## 2024-07-08 ENCOUNTER — Emergency Department (HOSPITAL_COMMUNITY)

## 2024-07-08 ENCOUNTER — Encounter (HOSPITAL_COMMUNITY): Payer: Self-pay

## 2024-07-08 ENCOUNTER — Other Ambulatory Visit: Payer: Self-pay

## 2024-07-08 ENCOUNTER — Emergency Department (HOSPITAL_COMMUNITY)
Admission: EM | Admit: 2024-07-08 | Discharge: 2024-07-09 | Disposition: A | Discharge status: Home / Self Care | Attending: Emergency Medicine | Admitting: Emergency Medicine

## 2024-07-08 DIAGNOSIS — R079 Chest pain, unspecified: Secondary | ICD-10-CM | POA: Diagnosis not present

## 2024-07-08 DIAGNOSIS — R0789 Other chest pain: Secondary | ICD-10-CM | POA: Diagnosis not present

## 2024-07-08 DIAGNOSIS — Z7901 Long term (current) use of anticoagulants: Secondary | ICD-10-CM | POA: Insufficient documentation

## 2024-07-08 LAB — CBC
HCT: 35 % — ABNORMAL LOW (ref 39.0–52.0)
Hemoglobin: 11.6 g/dL — ABNORMAL LOW (ref 13.0–17.0)
MCH: 24.4 pg — ABNORMAL LOW (ref 26.0–34.0)
MCHC: 33.1 g/dL (ref 30.0–36.0)
MCV: 73.5 fL — ABNORMAL LOW (ref 80.0–100.0)
Platelets: 166 K/uL (ref 150–400)
RBC: 4.76 MIL/uL (ref 4.22–5.81)
RDW: 18.7 % — ABNORMAL HIGH (ref 11.5–15.5)
WBC: 7.2 K/uL (ref 4.0–10.5)
nRBC: 0 % (ref 0.0–0.2)

## 2024-07-08 LAB — BASIC METABOLIC PANEL WITH GFR
Anion gap: 7 (ref 5–15)
BUN: 38 mg/dL — ABNORMAL HIGH (ref 8–23)
CO2: 18 mmol/L — ABNORMAL LOW (ref 22–32)
Calcium: 9.2 mg/dL (ref 8.9–10.3)
Chloride: 111 mmol/L (ref 98–111)
Creatinine, Ser: 2.6 mg/dL — ABNORMAL HIGH (ref 0.61–1.24)
GFR, Estimated: 23 mL/min — ABNORMAL LOW (ref >60)
Glucose, Bld: 82 mg/dL (ref 70–99)
Potassium: 4.3 mmol/L (ref 3.5–5.1)
Sodium: 136 mmol/L (ref 135–145)

## 2024-07-08 LAB — TROPONIN I (HIGH SENSITIVITY): Troponin I (High Sensitivity): 9 ng/L (ref <18)

## 2024-07-08 NOTE — ED Triage Notes (Addendum)
 Pt to ED with complaint of intermittent CP x2-3 days. Denies SHOB. Hx of stent placement - reports similar pain to when stent was placed.

## 2024-07-08 NOTE — ED Provider Triage Note (Signed)
 Emergency Medicine Provider Triage Evaluation Note  Tommy Dyer , a 88 y.o. male  was evaluated in triage.  Pt complains of central CP that comes and goes. No SOB or CP with exertion. Gets this in the mornings or laying down flat at night. Does not feel similar to prior cardiac episodes.   Review of Systems  Positive: CP, Cough Negative: Fever  Physical Exam  BP (!) 140/82 (BP Location: Left Arm)   Pulse 76   Temp 98.1 F (36.7 C) (Oral)   Resp 16   Ht 6' (1.829 m)   Wt 90.7 kg   SpO2 97%   BMI 27.12 kg/m  Gen:   Awake, no distress   Resp:  Normal effort  MSK:   Moves extremities without difficulty  Other:    Medical Decision Making  Medically screening exam initiated at 8:34 PM.  Appropriate orders placed.  Tommy Dyer was informed that the remainder of the evaluation will be completed by another provider, this initial triage assessment does not replace that evaluation, and the importance of remaining in the ED until their evaluation is complete.     Tommy Dyer SAILOR, [place] 07/08/24 2036

## 2024-07-09 DIAGNOSIS — R079 Chest pain, unspecified: Secondary | ICD-10-CM | POA: Diagnosis not present

## 2024-07-09 LAB — TROPONIN I (HIGH SENSITIVITY): Troponin I (High Sensitivity): 9 ng/L (ref <18)

## 2024-07-09 NOTE — ED Provider Notes (Signed)
 Tommy Dyer Provider Note   CSN: [id number] Arrival date & time: 07/08/24  1825     Patient presents with: Chest Pain   Tommy Dyer is a 88 y.o. male.   Patient presents to the emergency department for evaluation of chest pain.  He has been having intermittent chest pain today.  He reports that the pain comes on and lasts for a minute or 2 and then resolves.  He has not identified anything that causes the pain to happen.  No associated shortness of breath.  Patient reports that he is concerned because he has a cardiac stent.  The symptoms today, however, do not feel like his cardiac symptoms prior to stenting.       Prior to Admission medications   Medication Sig Start Date End Date Taking? Authorizing Provider  acetaminophen  (TYLENOL ) 500 MG tablet Take 1,000 mg by mouth every 6 (six) hours as needed for headache or moderate pain.    [provider]  atorvastatin  (LIPITOR ) 80 MG tablet TAKE 1 TABLET EVERY DAY 03/18/24   [place], Peter M, MD  Carboxymethylcellulose Sodium (REFRESH TEARS OP) Place 1 drop into both eyes daily as needed (dry eyes).     [provider]  ELIQUIS  2.5 MG TABS tablet TAKE 1 TABLET TWICE DAILY 04/19/24   [place], Peter M, MD  Famotidine (PEPCID AC PO) Take 1 tablet by mouth daily as needed (heartburn).    [provider]  fluticasone  (FLONASE ) 50 MCG/ACT nasal spray Place 1 spray into both nostrils daily as needed for allergies or rhinitis.     [provider]  irbesartan (AVAPRO) 300 MG tablet Take 300 mg by mouth daily. 10/11/22   [provider]  isosorbide  mononitrate (IMDUR ) 30 MG 24 hr tablet Take 0.5 tablets (15 mg total) by mouth daily. 12/06/22   Loistine Sober, NP  Lidocaine -Glycerin (PREPARATION H EX) Place 1 application rectally daily as needed (hemorrhoids/pain/itching).     [provider]  meclizine (ANTIVERT) 25 MG tablet Take 25 mg by mouth  daily as needed for dizziness. 03/25/20   [provider]  metoprolol  succinate (TOPROL -XL) 50 MG 24 hr tablet Take 1 tablet (50 mg total) by mouth daily. Take with or immediately following a meal. 03/16/20   Strader, [place] M, PA-C  nitroGLYCERIN  (NITROSTAT ) 0.4 MG SL tablet DISSOLVE ONE TABLET UNDER THE TONGUE EVERY 5 MINUTES AS NEEDED FOR CHEST PAIN.  DO NOT EXCEED A TOTAL OF 3 DOSES IN 15 MINUTES 06/20/24   Janene Boer, [place]  tamsulosin  (FLOMAX ) 0.4 MG CAPS capsule Take 0.4 mg by mouth every other day.    [provider]    Allergies: Patient has no known allergies.    Review of Systems  Updated Vital Signs BP (!) 148/79 (BP Location: Left Arm)   Pulse 66   Temp 98 F (36.7 C)   Resp 16   Ht 6' (1.829 m)   Wt 90.7 kg   SpO2 99%   BMI 27.12 kg/m   Physical Exam Vitals and nursing note reviewed.  Constitutional:      General: He is not in acute distress.    Appearance: He is well-developed.  HENT:     Head: Normocephalic and atraumatic.     Mouth/Throat:     Mouth: Mucous membranes are moist.  Eyes:     General: Vision grossly intact. Gaze aligned appropriately.     Extraocular Movements: Extraocular movements intact.     Conjunctiva/sclera:  Conjunctivae normal.  Cardiovascular:     Rate and Rhythm: Normal rate and regular rhythm.     Pulses: Normal pulses.     Heart sounds: Normal heart sounds, S1 normal and S2 normal. No murmur heard.    No friction rub. No gallop.  Pulmonary:     Effort: Pulmonary effort is normal. No respiratory distress.     Breath sounds: Normal breath sounds.  Abdominal:     Palpations: Abdomen is soft.     Tenderness: There is no abdominal tenderness. There is no guarding or rebound.     Hernia: No hernia is present.  Musculoskeletal:        General: No swelling.     Cervical back: Full passive range of motion without pain, normal range of motion and neck supple. No pain with movement, spinous process tenderness or muscular  tenderness. Normal range of motion.     Right lower leg: No edema.     Left lower leg: No edema.  Skin:    General: Skin is warm and dry.     Capillary Refill: Capillary refill takes less than 2 seconds.     Findings: No ecchymosis, erythema, lesion or wound.  Neurological:     Mental Status: He is alert and oriented to person, place, and time.     GCS: GCS eye subscore is 4. GCS verbal subscore is 5. GCS motor subscore is 6.     Cranial Nerves: Cranial nerves 2-12 are intact.     Sensory: Sensation is intact.     Motor: Motor function is intact. No weakness or abnormal muscle tone.     Coordination: Coordination is intact.  Psychiatric:        Mood and Affect: Mood normal.        Speech: Speech normal.        Behavior: Behavior normal.     (all labs ordered are listed, but only abnormal results are displayed) Labs Reviewed  BASIC METABOLIC PANEL WITH GFR - Abnormal; Notable for the following components:      Result Value   CO2 18 (*)    BUN 38 (*)    Creatinine, Ser 2.60 (*)    GFR, Estimated 23 (*)    All other components within normal limits  CBC - Abnormal; Notable for the following components:   Hemoglobin 11.6 (*)    HCT 35.0 (*)    MCV 73.5 (*)    MCH 24.4 (*)    RDW 18.7 (*)    All other components within normal limits  TROPONIN I (HIGH SENSITIVITY)  TROPONIN I (HIGH SENSITIVITY)    EKG: EKG Interpretation Date/Time:  Monday July 08 2024 19:16:02 EDT Ventricular Rate:  69 PR Interval:  216 QRS Duration:  84 QT Interval:  374 QTC Calculation: 400 R Axis:   16  Text Interpretation: Sinus rhythm with 1st degree A-V block Low voltage QRS Cannot rule out Anterior infarct , age undetermined Abnormal ECG When compared with ECG of 16-Oct-2023 14:01, No acute changes Confirmed by Haze Lonni PARAS ([id number]) on 07/09/2024 3:25:59 AM  Radiology: [facility] Chest 2 View Result Date: 07/08/2024 EXAM: 2 VIEW(S) XRAY OF THE CHEST 07/08/2024 07:34:00 PM COMPARISON: None  available. CLINICAL HISTORY: CP. intermittent CP x2-3 days. Denies SHOB. Hx of stent placement, CAD, STEMI, hypertension. FINDINGS: LUNGS AND PLEURA: No focal pulmonary opacity. No pulmonary edema. No pleural effusion. No pneumothorax. HEART AND MEDIASTINUM: No acute abnormality of the cardiac and mediastinal silhouettes. VASCULATURE: Atherosclerotic plaque. BONES  AND SOFT TISSUES: No acute osseous abnormality. IMPRESSION: 1. No acute cardiopulmonary process Electronically signed by: Morgane Naveau MD 07/08/2024 07:58 PM EDT RP Workstation: [id number]     Procedures   Medications Ordered in the ED - No data to display                                  Medical Decision Making Amount and/or Complexity of Data Reviewed External Data Reviewed: labs and ECG. Labs: ordered. Decision-making details documented in ED Course. Radiology: ordered and independent interpretation performed. Decision-making details documented in ED Course. ECG/medicine tests: ordered and independent interpretation performed. Decision-making details documented in ED Course.   Differential Diagnosis considered includes, but not limited to: STEMI; NSTEMI; myocarditis; pericarditis; pulmonary embolism; aortic dissection; pneumothorax; pneumonia; gastritis; musculoskeletal pain  Presents to the emergency department for evaluation of chest pain.  Patient having atypical pain.  He reports intermittent paroxysms of pain that only lasted a minute or 2 and are not related to exertion.  Patient appears well.  EKG is consistent with prior EKGs, no changes.  Troponins are negative.  Patient has contacted his cardiologist and has follow-up scheduled for next week.  I feel that this will be appropriate follow-up, I recommend continuing his current medications.  Given return precautions.     Final diagnoses:  Chest pain, unspecified type    ED Discharge Orders     None          Verner Kopischke, Lonni PARAS, MD 07/09/24 [phone]

## 2024-07-17 DIAGNOSIS — C61 Malignant neoplasm of prostate: Secondary | ICD-10-CM | POA: Diagnosis not present

## 2024-07-17 DIAGNOSIS — D6869 Other thrombophilia: Secondary | ICD-10-CM | POA: Diagnosis not present

## 2024-07-17 DIAGNOSIS — Z1331 Encounter for screening for depression: Secondary | ICD-10-CM | POA: Diagnosis not present

## 2024-07-17 DIAGNOSIS — N1832 Chronic kidney disease, stage 3b: Secondary | ICD-10-CM | POA: Diagnosis not present

## 2024-07-17 DIAGNOSIS — I480 Paroxysmal atrial fibrillation: Secondary | ICD-10-CM | POA: Diagnosis not present

## 2024-07-17 DIAGNOSIS — E7800 Pure hypercholesterolemia, unspecified: Secondary | ICD-10-CM | POA: Diagnosis not present

## 2024-07-17 DIAGNOSIS — Z0000 Encounter for general adult medical examination without abnormal findings: Secondary | ICD-10-CM | POA: Diagnosis not present

## 2024-07-17 DIAGNOSIS — I10 Essential (primary) hypertension: Secondary | ICD-10-CM | POA: Diagnosis not present

## 2024-07-17 DIAGNOSIS — D508 Other iron deficiency anemias: Secondary | ICD-10-CM | POA: Diagnosis not present

## 2024-07-17 DIAGNOSIS — N200 Calculus of kidney: Secondary | ICD-10-CM | POA: Diagnosis not present

## 2024-07-18 DIAGNOSIS — I480 Paroxysmal atrial fibrillation: Secondary | ICD-10-CM | POA: Diagnosis not present

## 2024-07-18 DIAGNOSIS — I10 Essential (primary) hypertension: Secondary | ICD-10-CM | POA: Diagnosis not present

## 2024-07-18 DIAGNOSIS — N1832 Chronic kidney disease, stage 3b: Secondary | ICD-10-CM | POA: Diagnosis not present

## 2024-07-19 ENCOUNTER — Ambulatory Visit: Attending: Emergency Medicine | Admitting: Emergency Medicine

## 2024-07-19 ENCOUNTER — Encounter: Payer: Self-pay | Admitting: Emergency Medicine

## 2024-07-19 VITALS — BP 118/66 | HR 78 | Ht 72.0 in | Wt 200.0 lb

## 2024-07-19 DIAGNOSIS — I25118 Atherosclerotic heart disease of native coronary artery with other forms of angina pectoris: Secondary | ICD-10-CM

## 2024-07-19 DIAGNOSIS — I480 Paroxysmal atrial fibrillation: Secondary | ICD-10-CM

## 2024-07-19 DIAGNOSIS — E785 Hyperlipidemia, unspecified: Secondary | ICD-10-CM | POA: Diagnosis not present

## 2024-07-19 DIAGNOSIS — I10 Essential (primary) hypertension: Secondary | ICD-10-CM | POA: Diagnosis not present

## 2024-07-19 DIAGNOSIS — N184 Chronic kidney disease, stage 4 (severe): Secondary | ICD-10-CM | POA: Diagnosis not present

## 2024-07-19 NOTE — Progress Notes (Signed)
 Cardiology Office Note:    Date:  07/19/2024  ID:  Tommy Dyer, DOB September 17, 1936, MRN [id number] PCP: Rexanne Ingle, MD  [facility] Providers Cardiologist:  Peter [place], MD Cardiology APP:  Rana Lum CROME, NP       Patient Profile:       Chief Complaint: 82-month follow-up History of Present Illness:  Tommy Dyer is a 88 y.o. male with visit-pertinent history of coronary disease, hypertension, hyperlipidemia, CKD stage IV, prostate cancer, atrial fibrillation  He had a lateral STEMI in June 2021 and found to have 100% occluded second marginal branch treated with DES, he has 80% mid LAD and 40% proximal LAD residual.  Recommendation is if he has refractory angina can consider PCI of LAD at a later time.  Echocardiogram 02/2020 showed LVEF 55 to 60%, grade 1 DD.  He was discharged on metoprolol , amlodipine , Brilinta  and aspirin .  He presented to the emergency department on 03/26/2020 with dizziness.  EKG demonstrated atrial fibrillation with RVR with heart rate of 131.  Plavix  was started to replace Brilinta  and Eliquis  was started 2.5 mg twice daily.  Hospital course was complicated by AKI.  He has been followed by Dr. Dalene at [facility].  He was seen in July 2023 with atypical chest pain that occurred due to a fall.  Myoview  was completed showing preserved EF, fixed inferolateral scar no ischemia.  Medical management was recommended.  He was last seen in clinic on 10/16/2023 by Dr. [place].  He was without acute symptoms.  Reported some atypical chest pain episodes but no real active angina.  If on the medication she is to try it.  He was to follow-up in 6 months.   Discussed the use of AI scribe software for clinical note transcription with the patient, who gave verbal consent to proceed.  History of Present Illness Tommy Dyer is an 88 year old male with atrial fibrillation and coronary artery disease who presents with 75-month follow-up.  He experienced  chest discomfort last week in the epigastric region that radiated up his sternum. Tylenol  was ineffective, but Mylanta resolved the discomfort. Emergency department evaluation on July 08, 2024, included normal troponin levels, EKG, and chest x-ray, with stable but abnormal kidney function.  He has occasional aching in the right armpit, described as tightness lasting two to three minutes, occurring once or twice a week.  This occurs when he overworks his right shoulder.  This is not associated with exertion, movement, or food intake.  He remains active, walking daily, particularly at [facility], without experiencing exertional chest pain. His past medical history includes atrial fibrillation, coronary artery disease with a stent placed in 2021, diabetes with an A1c of 6.6, and a cholesterol level of 51. He recalls similar atypical chest pains and shoulder discomfort six months ago, relieved with Tylenol .  He denies any symptoms concerning for recurrent atrial fibrillation.  He denies orthopnea, PND, palpitations, lightheadedness, dizziness, syncope or presyncope   Review of systems:  Please see the history of present illness. All other systems are reviewed and otherwise negative.      Studies Reviewed:        Lexiscan  Myoview  06/30/2022   No ST deviation was noted.   LV perfusion is abnormal. There is no evidence of ischemia. There is evidence of infarction. Defect 1: There is a medium defect with severe reduction in uptake present in the mid to basal inferolateral location(s) that is fixed. There is normal wall motion in the defect area. Consistent with  infarction.   Left ventricular function is normal.  EF 53%.  End diastolic cavity size is normal.   Findings are consistent with prior myocardial infarction in the inferolateral distribution. The study is overall low risk.  There is no significant ischemia identified.  Echocardiogram 03/14/2020  1. Left ventricular ejection fraction, by  estimation, is 55 to 60%. The  left ventricle has normal function. The left ventricle has no regional  wall motion abnormalities. Left ventricular diastolic parameters are  consistent with Grade I diastolic  dysfunction (impaired relaxation). Elevated left atrial pressure.   2. Right ventricular systolic function is normal. The right ventricular  size is normal.   3. The mitral valve is normal in structure. No evidence of mitral valve  regurgitation. No evidence of mitral stenosis.   4. The aortic valve is normal in structure. Aortic valve regurgitation is  not visualized. Mild to moderate aortic valve sclerosis/calcification is  present, without any evidence of aortic stenosis.   5. The inferior vena cava is normal in size with greater than 50%  respiratory variability, suggesting right atrial pressure of 3 mmHg.   Cardiac catheterization 03/13/2020 Prox LAD lesion is 40% stenosed. Mid LAD lesion is 80% stenosed. 2nd Mrg lesion is 100% stenosed. Post intervention, there is a 0% residual stenosis. A drug-eluting stent was successfully placed using a STENT RESOLUTE ONYX [id number]. LV end diastolic pressure is mildly elevated.   1. 2 vessel obstructive CAD    -80% mid LAD    -100% second OM 2. Elevated LVEDP 23 mm Hg 3. Successful PCI of the second OM with DES x 1   Plan: DAPT for one year. Hydrate. Monitor renal function closely. Depending on recovery of renal function consider staged PCI of the mid LAD. Will assess LV function by Echo.  Diagnostic Dominance: Left  Intervention   Risk Assessment/Calculations:    CHA2DS2-VASc Score = 4   This indicates a 4.8% annual risk of stroke. The patient's score is based upon: CHF History: 0 HTN History: 1 Diabetes History: 0 Stroke History: 0 Vascular Disease History: 1 Age Score: 2 Gender Score: 0             Physical Exam:   VS:  BP 118/66 (BP Location: Left Arm, Patient Position: Sitting, Cuff Size: Normal)   Pulse 78   Ht  6' (1.829 m)   Wt 200 lb (90.7 kg)   SpO2 98%   BMI 27.12 kg/m    Wt Readings from Last 3 Encounters:  07/19/24 200 lb (90.7 kg)  07/08/24 200 lb (90.7 kg)  10/16/23 209 lb (94.8 kg)    GEN: Well nourished, well developed in no acute distress NECK: No JVD; No carotid bruits CARDIAC: RRR, no murmurs, rubs, gallops RESPIRATORY:  Clear to auscultation without rales, wheezing or rhonchi  ABDOMEN: Soft, non-tender, non-distended EXTREMITIES:  No edema; No acute deformity      Assessment and Plan:  Coronary artery disease Lateral STEMI on 02/2020 s/p PCI/DES to second OM Lexiscan  Myoview  06/2022 with no ischemia or infarction, low risk - EKG 10/13 reviewed showing no acute changes - Today he is doing well without typical anginal symptoms.  Describes 1 atypical chest pain episode described as epigastric discomfort radiating up his sternum relieved by Maalox.  He remains very active without exertional symptoms - No symptoms to suggest active angina, no indication further ischemic evaluation at this time - Continue atorvastatin  80 mg daily, metoprolol  succinate 50 mg daily, isosorbide  15 mg daily, and nitroglycerin   as needed - No ASA given chronic anticoagulation  Hypertension Blood pressure today well-controlled at 118/66 - Continue irbesartan 300 mg daily, isosorbide  15 mg daily, and metoprolol  succinate 50 mg daily  Hyperlipidemia LDL 51 on 04/2024 and well-controlled - Continue atorvastatin  80 mg daily  Paroxysmal atrial fibrillation Today he appears to maintain normal sinus rhythm by auscultation - EKG 10/13 reviewed showing normal sinus rhythm - He denies any symptoms concerning for recurrent atrial fibrillation - Continue Eliquis  2.5 mg twice daily (lower dose due to age and CKD) - Continue metoprolol  succinate 50 mg daily  Chronic kidney disease stage IV GFR 23 on 10/13 - Stable.  Followed by nephrology      Dispo:  Return in about 6 months (around  01/17/2025).  Signed, Lum LITTIE Louis, NP

## 2024-07-19 NOTE — Patient Instructions (Signed)
 Medication Instructions:  NO CHANGES  Lab Work: NONE TO BE DONE TODAY.  Testing/Procedures: NONE  Follow-Up: At [facility], you and your health needs are our priority.  As part of our continuing mission to provide you with exceptional heart care, our providers are all part of one team.  This team includes your primary Cardiologist (physician) and Advanced Practice Providers or APPs (Physician Assistants and Nurse Practitioners) who all work together to provide you with the care you need, when you need it.  Your next appointment:   6 MONTHS  Provider:   Peter [place], MD OR Lum Louis, NP

## 2024-07-26 DIAGNOSIS — I10 Essential (primary) hypertension: Secondary | ICD-10-CM | POA: Diagnosis not present

## 2024-07-26 DIAGNOSIS — N1832 Chronic kidney disease, stage 3b: Secondary | ICD-10-CM | POA: Diagnosis not present

## 2024-07-26 DIAGNOSIS — E7800 Pure hypercholesterolemia, unspecified: Secondary | ICD-10-CM | POA: Diagnosis not present

## 2024-07-26 DIAGNOSIS — C61 Malignant neoplasm of prostate: Secondary | ICD-10-CM | POA: Diagnosis not present

## 2024-07-26 DIAGNOSIS — I480 Paroxysmal atrial fibrillation: Secondary | ICD-10-CM | POA: Diagnosis not present

## 2024-08-17 DIAGNOSIS — N1832 Chronic kidney disease, stage 3b: Secondary | ICD-10-CM | POA: Diagnosis not present

## 2024-08-17 DIAGNOSIS — I10 Essential (primary) hypertension: Secondary | ICD-10-CM | POA: Diagnosis not present

## 2024-08-17 DIAGNOSIS — I480 Paroxysmal atrial fibrillation: Secondary | ICD-10-CM | POA: Diagnosis not present

## 2024-08-25 DIAGNOSIS — N1832 Chronic kidney disease, stage 3b: Secondary | ICD-10-CM | POA: Diagnosis not present

## 2024-08-25 DIAGNOSIS — C61 Malignant neoplasm of prostate: Secondary | ICD-10-CM | POA: Diagnosis not present

## 2024-08-25 DIAGNOSIS — I480 Paroxysmal atrial fibrillation: Secondary | ICD-10-CM | POA: Diagnosis not present

## 2024-08-25 DIAGNOSIS — E7800 Pure hypercholesterolemia, unspecified: Secondary | ICD-10-CM | POA: Diagnosis not present

## 2024-08-25 DIAGNOSIS — I10 Essential (primary) hypertension: Secondary | ICD-10-CM | POA: Diagnosis not present

## 2024-08-26 DIAGNOSIS — N184 Chronic kidney disease, stage 4 (severe): Secondary | ICD-10-CM | POA: Diagnosis not present
